# Patient Record
Sex: Male | Born: 1985 | Race: White | Hispanic: No | Smoking: Never smoker
Health system: Southern US, Community
[De-identification: ages and names within clinical notes are randomized; demographics above are authoritative.]

## PROBLEM LIST (undated history)

## (undated) ENCOUNTER — Emergency Department (HOSPITAL_COMMUNITY): Admission: EM | Payer: Medicare Other

## (undated) DIAGNOSIS — L309 Dermatitis, unspecified: Secondary | ICD-10-CM

## (undated) DIAGNOSIS — Z77011 Contact with and (suspected) exposure to lead: Secondary | ICD-10-CM

## (undated) DIAGNOSIS — K625 Hemorrhage of anus and rectum: Secondary | ICD-10-CM

## (undated) DIAGNOSIS — I10 Essential (primary) hypertension: Secondary | ICD-10-CM

## (undated) DIAGNOSIS — K5900 Constipation, unspecified: Secondary | ICD-10-CM

## (undated) DIAGNOSIS — R9431 Abnormal electrocardiogram [ECG] [EKG]: Secondary | ICD-10-CM

## (undated) DIAGNOSIS — M1990 Unspecified osteoarthritis, unspecified site: Secondary | ICD-10-CM

## (undated) DIAGNOSIS — R1032 Left lower quadrant pain: Secondary | ICD-10-CM

## (undated) DIAGNOSIS — R160 Hepatomegaly, not elsewhere classified: Secondary | ICD-10-CM

## (undated) DIAGNOSIS — K760 Fatty (change of) liver, not elsewhere classified: Secondary | ICD-10-CM

## (undated) DIAGNOSIS — F319 Bipolar disorder, unspecified: Secondary | ICD-10-CM

## (undated) DIAGNOSIS — F32A Depression, unspecified: Secondary | ICD-10-CM

## (undated) DIAGNOSIS — F410 Panic disorder [episodic paroxysmal anxiety] without agoraphobia: Secondary | ICD-10-CM

## (undated) DIAGNOSIS — N62 Hypertrophy of breast: Secondary | ICD-10-CM

## (undated) DIAGNOSIS — E785 Hyperlipidemia, unspecified: Secondary | ICD-10-CM

## (undated) DIAGNOSIS — K219 Gastro-esophageal reflux disease without esophagitis: Secondary | ICD-10-CM

## (undated) DIAGNOSIS — F329 Major depressive disorder, single episode, unspecified: Secondary | ICD-10-CM

## (undated) DIAGNOSIS — K529 Noninfective gastroenteritis and colitis, unspecified: Secondary | ICD-10-CM

## (undated) DIAGNOSIS — R079 Chest pain, unspecified: Secondary | ICD-10-CM

## (undated) DIAGNOSIS — R109 Unspecified abdominal pain: Secondary | ICD-10-CM

## (undated) DIAGNOSIS — E119 Type 2 diabetes mellitus without complications: Secondary | ICD-10-CM

## (undated) HISTORY — DX: Depression, unspecified: F32.A

## (undated) HISTORY — DX: Fatty (change of) liver, not elsewhere classified: K76.0

## (undated) HISTORY — DX: Hepatomegaly, not elsewhere classified: R16.0

## (undated) HISTORY — DX: Gastro-esophageal reflux disease without esophagitis: K21.9

## (undated) HISTORY — DX: Hyperlipidemia, unspecified: E78.5

## (undated) HISTORY — DX: Hypertrophy of breast: N62

## (undated) HISTORY — PX: OTHER SURGICAL HISTORY: SHX169

## (undated) HISTORY — DX: Major depressive disorder, single episode, unspecified: F32.9

## (undated) HISTORY — PX: DEBRIDEMENT AND CLOSURE WOUND: SHX5614

---
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
## 2008-04-06 ENCOUNTER — Emergency Department (HOSPITAL_COMMUNITY): Admission: EM | Admit: 2008-04-06 | Discharge: 2008-04-06 | Payer: Self-pay | Admitting: Emergency Medicine

## 2008-07-18 ENCOUNTER — Emergency Department (HOSPITAL_COMMUNITY): Admission: EM | Admit: 2008-07-18 | Discharge: 2008-07-18 | Payer: Self-pay | Admitting: Family Medicine

## 2011-12-10 DIAGNOSIS — T300 Burn of unspecified body region, unspecified degree: Secondary | ICD-10-CM | POA: Diagnosis not present

## 2011-12-10 DIAGNOSIS — L7211 Pilar cyst: Secondary | ICD-10-CM | POA: Diagnosis not present

## 2011-12-10 DIAGNOSIS — Z79899 Other long term (current) drug therapy: Secondary | ICD-10-CM | POA: Diagnosis not present

## 2011-12-12 DIAGNOSIS — D485 Neoplasm of uncertain behavior of skin: Secondary | ICD-10-CM | POA: Diagnosis not present

## 2011-12-12 DIAGNOSIS — L7211 Pilar cyst: Secondary | ICD-10-CM | POA: Diagnosis not present

## 2011-12-22 DIAGNOSIS — H53149 Visual discomfort, unspecified: Secondary | ICD-10-CM | POA: Diagnosis not present

## 2011-12-22 DIAGNOSIS — H3129 Other hereditary choroidal dystrophy: Secondary | ICD-10-CM | POA: Diagnosis not present

## 2011-12-22 DIAGNOSIS — H5210 Myopia, unspecified eye: Secondary | ICD-10-CM | POA: Diagnosis not present

## 2012-06-03 DIAGNOSIS — R112 Nausea with vomiting, unspecified: Secondary | ICD-10-CM | POA: Diagnosis not present

## 2012-06-03 DIAGNOSIS — R509 Fever, unspecified: Secondary | ICD-10-CM | POA: Diagnosis not present

## 2012-06-03 DIAGNOSIS — R1110 Vomiting, unspecified: Secondary | ICD-10-CM | POA: Diagnosis not present

## 2012-06-03 DIAGNOSIS — R109 Unspecified abdominal pain: Secondary | ICD-10-CM | POA: Diagnosis not present

## 2012-06-03 DIAGNOSIS — R197 Diarrhea, unspecified: Secondary | ICD-10-CM | POA: Diagnosis not present

## 2012-08-12 DIAGNOSIS — F3132 Bipolar disorder, current episode depressed, moderate: Secondary | ICD-10-CM | POA: Diagnosis not present

## 2012-08-16 DIAGNOSIS — Z880 Allergy status to penicillin: Secondary | ICD-10-CM | POA: Diagnosis not present

## 2012-08-16 DIAGNOSIS — J020 Streptococcal pharyngitis: Secondary | ICD-10-CM | POA: Diagnosis not present

## 2012-12-10 DIAGNOSIS — R109 Unspecified abdominal pain: Secondary | ICD-10-CM | POA: Diagnosis not present

## 2012-12-10 DIAGNOSIS — L02219 Cutaneous abscess of trunk, unspecified: Secondary | ICD-10-CM | POA: Diagnosis not present

## 2012-12-12 ENCOUNTER — Encounter (HOSPITAL_COMMUNITY): Payer: Self-pay | Admitting: *Deleted

## 2012-12-12 ENCOUNTER — Emergency Department (HOSPITAL_COMMUNITY)
Admission: EM | Admit: 2012-12-12 | Discharge: 2012-12-12 | Disposition: A | Discharge status: Home / Self Care | Payer: Medicare Other | Attending: Emergency Medicine | Admitting: Emergency Medicine

## 2012-12-12 DIAGNOSIS — L02219 Cutaneous abscess of trunk, unspecified: Secondary | ICD-10-CM | POA: Diagnosis not present

## 2012-12-12 DIAGNOSIS — L03319 Cellulitis of trunk, unspecified: Secondary | ICD-10-CM | POA: Diagnosis not present

## 2012-12-12 DIAGNOSIS — L0390 Cellulitis, unspecified: Secondary | ICD-10-CM

## 2012-12-12 DIAGNOSIS — Z880 Allergy status to penicillin: Secondary | ICD-10-CM | POA: Diagnosis not present

## 2012-12-12 DIAGNOSIS — F17200 Nicotine dependence, unspecified, uncomplicated: Secondary | ICD-10-CM | POA: Insufficient documentation

## 2012-12-12 LAB — CBC WITH DIFFERENTIAL/PLATELET
Basophils Absolute: 0 10*3/uL (ref 0.0–0.1)
Basophils Relative: 1 % (ref 0–1)
HCT: 42.1 % (ref 39.0–52.0)
Lymphocytes Relative: 38 % (ref 12–46)
MCHC: 34.9 g/dL (ref 30.0–36.0)
Monocytes Absolute: 0.8 10*3/uL (ref 0.1–1.0)
Neutro Abs: 3.1 10*3/uL (ref 1.7–7.7)
Neutrophils Relative %: 47 % (ref 43–77)
Platelets: 253 10*3/uL (ref 150–400)
RDW: 11.9 % (ref 11.5–15.5)
WBC: 6.6 10*3/uL (ref 4.0–10.5)

## 2012-12-12 LAB — BASIC METABOLIC PANEL
CO2: 29 mEq/L (ref 19–32)
Chloride: 97 mEq/L (ref 96–112)
GFR calc Af Amer: >90 mL/min (ref >90)
Potassium: 4.3 mEq/L (ref 3.5–5.1)
Sodium: 135 mEq/L (ref 135–145)

## 2012-12-12 MED ORDER — VANCOMYCIN HCL IN DEXTROSE 1-5 GM/200ML-% IV SOLN
1000.0000 mg | Freq: Once | INTRAVENOUS | Status: AC
  Administered 2012-12-12: 1000 mg via INTRAVENOUS
  Filled 2012-12-12: qty 200

## 2012-12-12 NOTE — ED Notes (Signed)
Pt c/o "knot" to left groin area that started Friday evening, was seen at [facility] yesterday, had ct scan and xray's performed, given iv antibiotics, pt states that the area has gotten bigger and more painful since yesterday.

## 2012-12-12 NOTE — ED Notes (Signed)
Eating crackers/peanut butter, NAD

## 2012-12-12 NOTE — ED Provider Notes (Signed)
History  This chart was scribed for Preston Lennert, MD by Bennett Scrape, ED Scribe. This patient was seen in room APA08/[facility]08 and the patient's care was started at 4:42 PM.  CSN: [id number]  Arrival date & time 12/12/12  1520   First MD Initiated Contact with Patient 12/12/12 1642      Chief Complaint  Patient presents with  . Groin Pain     Patient is a 27 y.o. male presenting with groin pain. The history is provided by the patient. No language interpreter was used.  Groin Pain This is a new problem. The current episode started 2 days ago. The problem occurs constantly. The problem has been gradually worsening. Pertinent negatives include no chest pain and no abdominal pain. The symptoms are aggravated by walking. Nothing relieves the symptoms. Treatments tried: IV antibiotics  The treatment provided mild relief.    HPI Comments: Preston Berry is a 27 y.o. male who presents to the Emergency Department complaining of 2 days of gradual onset, gradually worsening, constant left groin pain. Pt was seen at [facility] yesterday for the same and has a CT scan done that showed an infection that wasn't able to be lanced at the time. He received IV antibiotics and was discharged with Norco and Bactrim. He states that the area has gotten bigger since yesterday. He denies fevers, chills and nausea as associated symptoms. Pt does not have a h/o chronic medical conditions. Pt is a current everyday smoker but denies alcohol use.   No PCP  History reviewed. No pertinent past medical history.  Past Surgical History  Procedure Laterality Date  . Abdominal surgery      History reviewed. No pertinent family history.  History  Substance Use Topics  . Smoking status: Current Some Day Smoker  . Smokeless tobacco: Not on file  . Alcohol Use: No      Review of Systems  Constitutional: Negative for appetite change and fatigue.  HENT: Negative for congestion, sinus pressure and ear discharge.    Eyes: Negative for discharge.  Respiratory: Negative for cough.   Cardiovascular: Negative for chest pain.  Gastrointestinal: Negative for abdominal pain and diarrhea.  Genitourinary: Negative for frequency and hematuria.  Musculoskeletal: Negative for back pain.  Skin: Negative for rash.    Allergies  Penicillins  Home Medications   Current Outpatient Rx  Name  Route  Sig  Dispense  Refill  . HYDROcodone-acetaminophen (NORCO/VICODIN) 5-325 MG per tablet   Oral   Take 1 tablet by mouth every 6 (six) hours as needed for pain.         Marland Kitchen sulfamethoxazole-trimethoprim (BACTRIM DS) 800-160 MG per tablet   Oral   Take 1 tablet by mouth 2 (two) times daily. Started (12/11/2012)           Triage Vitals :BP 134/78  Pulse 74  Temp(Src) 98.3 F (36.8 C) (Oral)  Resp 24  Ht 5\' 7"  (1.702 m)  Wt 195 lb (88.451 kg)  BMI 30.53 kg/m2  SpO2 100%  Physical Exam  Nursing note and vitals reviewed. Constitutional: He is oriented to person, place, and time. He appears well-developed and well-nourished.  HENT:  Head: Normocephalic and atraumatic.  Eyes: Conjunctivae are normal.  Neck: No tracheal deviation present.  Cardiovascular: Normal rate.   Pulmonary/Chest: Effort normal.  Genitourinary:  Swelling and tenderness to left inguinal area, possible cellulitic rash, no obvious fluctuant area to drain  Musculoskeletal: Normal range of motion.  Neurological: He is alert and oriented  to person, place, and time.  Skin: Skin is warm and dry.  Psychiatric: He has a normal mood and affect. His behavior is normal.    ED Course  Procedures (including critical care time)  Medications  vancomycin (VANCOCIN) IVPB 1000 mg/200 mL premix (0 mg Intravenous Stopped 12/12/12 1851)    DIAGNOSTIC STUDIES: Oxygen Saturation is 100% on room air, normal by my interpretation.    COORDINATION OF CARE: 4:47 PM-Discussed treatment plan which includes IV antibiotics, CBC and BMP with pt and pt agreed  to plan. Will provie a surgery referral to f/u as needed.   7:34 PM-Informed pt of lab work results. Discussed discharge plan which includes continued use of antibiotics with pt and pt agreed to plan. Also advised pt to follow up with surgery referral as needed and pt agreed. Addressed symptoms to return for with pt.   Labs Reviewed  CBC WITH DIFFERENTIAL  BASIC METABOLIC PANEL   No results found.   No diagnosis found.    MDM  Cellulitis no obvious abscess now.  Neg ct for abscess.  To follow up   The chart was scribed for me under my direct supervision.  I personally performed the history, physical, and medical decision making and all procedures in the evaluation of this patient.Preston Lennert, MD 12/12/12 [phone]

## 2012-12-19 DIAGNOSIS — J40 Bronchitis, not specified as acute or chronic: Secondary | ICD-10-CM | POA: Diagnosis not present

## 2012-12-19 DIAGNOSIS — R509 Fever, unspecified: Secondary | ICD-10-CM | POA: Diagnosis not present

## 2012-12-19 DIAGNOSIS — J209 Acute bronchitis, unspecified: Secondary | ICD-10-CM | POA: Diagnosis not present

## 2012-12-19 DIAGNOSIS — J029 Acute pharyngitis, unspecified: Secondary | ICD-10-CM | POA: Diagnosis not present

## 2013-01-25 ENCOUNTER — Emergency Department (HOSPITAL_COMMUNITY)
Admission: EM | Admit: 2013-01-25 | Discharge: 2013-01-25 | Disposition: A | Discharge status: Home / Self Care | Payer: Medicare Other | Attending: Emergency Medicine | Admitting: Emergency Medicine

## 2013-01-25 ENCOUNTER — Encounter (HOSPITAL_COMMUNITY): Payer: Self-pay

## 2013-01-25 DIAGNOSIS — L929 Granulomatous disorder of the skin and subcutaneous tissue, unspecified: Secondary | ICD-10-CM

## 2013-01-25 DIAGNOSIS — F17200 Nicotine dependence, unspecified, uncomplicated: Secondary | ICD-10-CM | POA: Insufficient documentation

## 2013-01-25 DIAGNOSIS — L980 Pyogenic granuloma: Secondary | ICD-10-CM | POA: Insufficient documentation

## 2013-01-25 MED ORDER — IBUPROFEN 800 MG PO TABS: 800.0000 mg | ORAL_TABLET | Freq: Three times a day (TID) | ORAL | Status: DC

## 2013-01-25 MED ORDER — DOXYCYCLINE HYCLATE 100 MG PO CAPS: 100.0000 mg | ORAL_CAPSULE | Freq: Two times a day (BID) | ORAL | Status: DC

## 2013-01-25 MED ORDER — LIDOCAINE HCL (PF) 1 % IJ SOLN
INTRAMUSCULAR | Status: AC
  Administered 2013-01-25: 16:00:00
  Filled 2013-01-25: qty 5

## 2013-01-25 NOTE — ED Notes (Signed)
Pt reports abscess left inner thigh for about a month.  Pt reports taking antibiotics w/out relief.  Pt reports the area "draining yellow pus".
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
## 2013-02-06 NOTE — ED Provider Notes (Signed)
Medical screening examination/treatment/procedure(s) were performed by non-physician practitioner and as supervising physician I was immediately available for consultation/collaboration.   Shelda Jakes, MD 02/06/13 [phone]

## 2013-03-18 DIAGNOSIS — IMO0002 Reserved for concepts with insufficient information to code with codable children: Secondary | ICD-10-CM | POA: Diagnosis not present

## 2013-03-18 DIAGNOSIS — X500XXA Overexertion from strenuous movement or load, initial encounter: Secondary | ICD-10-CM | POA: Diagnosis not present

## 2013-03-18 DIAGNOSIS — Z880 Allergy status to penicillin: Secondary | ICD-10-CM | POA: Diagnosis not present

## 2013-03-18 DIAGNOSIS — Z809 Family history of malignant neoplasm, unspecified: Secondary | ICD-10-CM | POA: Diagnosis not present

## 2013-03-18 DIAGNOSIS — M25569 Pain in unspecified knee: Secondary | ICD-10-CM | POA: Diagnosis not present

## 2013-05-20 DIAGNOSIS — M545 Low back pain, unspecified: Secondary | ICD-10-CM | POA: Diagnosis not present

## 2013-05-20 DIAGNOSIS — Z809 Family history of malignant neoplasm, unspecified: Secondary | ICD-10-CM | POA: Diagnosis not present

## 2013-05-20 DIAGNOSIS — IMO0002 Reserved for concepts with insufficient information to code with codable children: Secondary | ICD-10-CM | POA: Diagnosis not present

## 2013-06-25 DIAGNOSIS — Z711 Person with feared health complaint in whom no diagnosis is made: Secondary | ICD-10-CM | POA: Diagnosis not present

## 2013-08-19 DIAGNOSIS — R112 Nausea with vomiting, unspecified: Secondary | ICD-10-CM | POA: Diagnosis not present

## 2013-08-19 DIAGNOSIS — N341 Nonspecific urethritis: Secondary | ICD-10-CM | POA: Diagnosis not present

## 2013-08-19 DIAGNOSIS — R1013 Epigastric pain: Secondary | ICD-10-CM | POA: Diagnosis not present

## 2013-08-19 DIAGNOSIS — F319 Bipolar disorder, unspecified: Secondary | ICD-10-CM | POA: Diagnosis not present

## 2013-08-19 DIAGNOSIS — R059 Cough, unspecified: Secondary | ICD-10-CM | POA: Diagnosis not present

## 2013-08-19 DIAGNOSIS — B338 Other specified viral diseases: Secondary | ICD-10-CM | POA: Diagnosis not present

## 2013-08-19 DIAGNOSIS — R1110 Vomiting, unspecified: Secondary | ICD-10-CM | POA: Diagnosis not present

## 2013-08-19 DIAGNOSIS — R509 Fever, unspecified: Secondary | ICD-10-CM | POA: Diagnosis not present

## 2013-08-19 DIAGNOSIS — R197 Diarrhea, unspecified: Secondary | ICD-10-CM | POA: Diagnosis not present

## 2013-08-20 ENCOUNTER — Emergency Department (HOSPITAL_COMMUNITY)
Admission: EM | Admit: 2013-08-20 | Discharge: 2013-08-21 | Disposition: A | Discharge status: Home / Self Care | Payer: Medicare Other | Attending: Emergency Medicine | Admitting: Emergency Medicine

## 2013-08-20 ENCOUNTER — Encounter (HOSPITAL_COMMUNITY): Payer: Self-pay | Admitting: Emergency Medicine

## 2013-08-20 DIAGNOSIS — R52 Pain, unspecified: Secondary | ICD-10-CM | POA: Diagnosis not present

## 2013-08-20 DIAGNOSIS — R112 Nausea with vomiting, unspecified: Secondary | ICD-10-CM | POA: Insufficient documentation

## 2013-08-20 DIAGNOSIS — M129 Arthropathy, unspecified: Secondary | ICD-10-CM | POA: Diagnosis not present

## 2013-08-20 DIAGNOSIS — F17200 Nicotine dependence, unspecified, uncomplicated: Secondary | ICD-10-CM | POA: Diagnosis not present

## 2013-08-20 DIAGNOSIS — B9789 Other viral agents as the cause of diseases classified elsewhere: Secondary | ICD-10-CM | POA: Insufficient documentation

## 2013-08-20 DIAGNOSIS — R197 Diarrhea, unspecified: Secondary | ICD-10-CM | POA: Insufficient documentation

## 2013-08-20 DIAGNOSIS — R1084 Generalized abdominal pain: Secondary | ICD-10-CM | POA: Diagnosis not present

## 2013-08-20 DIAGNOSIS — B349 Viral infection, unspecified: Secondary | ICD-10-CM

## 2013-08-20 DIAGNOSIS — B338 Other specified viral diseases: Secondary | ICD-10-CM | POA: Diagnosis not present

## 2013-08-20 DIAGNOSIS — Z880 Allergy status to penicillin: Secondary | ICD-10-CM | POA: Insufficient documentation

## 2013-08-20 HISTORY — DX: Unspecified osteoarthritis, unspecified site: M19.90

## 2013-08-20 MED ORDER — ONDANSETRON HCL 4 MG/2ML IJ SOLN
4.0000 mg | Freq: Once | INTRAMUSCULAR | Status: AC
  Administered 2013-08-20: 4 mg via INTRAVENOUS
  Filled 2013-08-20: qty 2

## 2013-08-20 MED ORDER — KETOROLAC TROMETHAMINE 30 MG/ML IJ SOLN
30.0000 mg | Freq: Once | INTRAMUSCULAR | Status: AC
  Administered 2013-08-20: 30 mg via INTRAVENOUS
  Filled 2013-08-20: qty 1

## 2013-08-20 MED ORDER — SODIUM CHLORIDE 0.9 % IV SOLN
Freq: Once | INTRAVENOUS | Status: AC
  Administered 2013-08-20: via INTRAVENOUS

## 2013-08-20 MED ORDER — ACETAMINOPHEN 325 MG PO TABS
650.0000 mg | ORAL_TABLET | Freq: Once | ORAL | Status: AC
  Administered 2013-08-20: 650 mg via ORAL
  Filled 2013-08-20: qty 2

## 2013-08-20 NOTE — ED Notes (Signed)
Pt. Reports flu like symptoms starting last week. Pt. Reports being diagnosed with a Staph infection at [facility]. Pt. Reports increased nausea/vomiting/diarrhea with fever/chills.
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
## 2013-08-21 LAB — CBC WITH DIFFERENTIAL/PLATELET
BASOS PCT: 0 % (ref 0–1)
Basophils Absolute: 0 10*3/uL (ref 0.0–0.1)
EOS ABS: 0.1 10*3/uL (ref 0.0–0.7)
Eosinophils Relative: 1 % (ref 0–5)
HCT: 45.3 % (ref 39.0–52.0)
Hemoglobin: 15.8 g/dL (ref 13.0–17.0)
Lymphocytes Relative: 14 % (ref 12–46)
Lymphs Abs: 1 10*3/uL (ref 0.7–4.0)
MCH: 30.9 pg (ref 26.0–34.0)
MCHC: 34.9 g/dL (ref 30.0–36.0)
MCV: 88.6 fL (ref 78.0–100.0)
MONOS PCT: 12 % (ref 3–12)
Monocytes Absolute: 0.8 10*3/uL (ref 0.1–1.0)
NEUTROS PCT: 73 % (ref 43–77)
Neutro Abs: 5 10*3/uL (ref 1.7–7.7)
PLATELETS: 242 10*3/uL (ref 150–400)
RBC: 5.11 MIL/uL (ref 4.22–5.81)
RDW: 12.4 % (ref 11.5–15.5)
WBC: 6.9 10*3/uL (ref 4.0–10.5)

## 2013-08-21 LAB — BASIC METABOLIC PANEL
BUN: 10 mg/dL (ref 6–23)
CO2: 28 mEq/L (ref 19–32)
Calcium: 9.5 mg/dL (ref 8.4–10.5)
Chloride: 94 mEq/L — ABNORMAL LOW (ref 96–112)
Creatinine, Ser: 0.92 mg/dL (ref 0.50–1.35)
Glucose, Bld: 97 mg/dL (ref 70–99)
POTASSIUM: 3.9 meq/L (ref 3.7–5.3)
SODIUM: 134 meq/L — AB (ref 137–147)

## 2013-08-21 LAB — URINALYSIS, ROUTINE W REFLEX MICROSCOPIC
BILIRUBIN URINE: NEGATIVE
Glucose, UA: NEGATIVE mg/dL
Hgb urine dipstick: NEGATIVE
Ketones, ur: NEGATIVE mg/dL
Leukocytes, UA: NEGATIVE
NITRITE: NEGATIVE
PROTEIN: NEGATIVE mg/dL
SPECIFIC GRAVITY, URINE: 1.015 (ref 1.005–1.030)
UROBILINOGEN UA: 0.2 mg/dL (ref 0.0–1.0)
pH: 7.5 (ref 5.0–8.0)

## 2013-08-21 MED ORDER — ONDANSETRON HCL 4 MG PO TABS: 4.0000 mg | ORAL_TABLET | Freq: Four times a day (QID) | ORAL | Status: DC

## 2013-08-21 MED ORDER — BENZONATATE 200 MG PO CAPS: 200.0000 mg | ORAL_CAPSULE | Freq: Three times a day (TID) | ORAL | Status: DC | PRN

## 2013-08-21 MED ORDER — IBUPROFEN 800 MG PO TABS: 800.0000 mg | ORAL_TABLET | Freq: Three times a day (TID) | ORAL | Status: DC

## 2013-08-21 NOTE — Discharge Instructions (Signed)
Diet for Diarrhea, Adult Frequent, runny stools (diarrhea) may be caused or worsened by food or drink. Diarrhea may be relieved by changing your diet. Since diarrhea can last up to 7 days, it is easy for you to lose too much fluid from the body and become dehydrated. Fluids that are lost need to be replaced. Along with a modified diet, make sure you drink enough fluids to keep your urine clear or pale yellow. DIET INSTRUCTIONS  Ensure adequate fluid intake (hydration): have 1 cup (8 oz) of fluid for each diarrhea episode. Avoid fluids that contain simple sugars or sports drinks, fruit juices, whole milk products, and sodas. Your urine should be clear or pale yellow if you are drinking enough fluids. Hydrate with an oral rehydration solution that you can purchase at pharmacies, retail stores, and online. You can prepare an oral rehydration solution at home by mixing the following ingredients together:    tsp table salt.   tsp baking soda.   tsp salt substitute containing potassium chloride.  1  tablespoons sugar.  1 L (34 oz) of water.  Certain foods and beverages may increase the speed at which food moves through the gastrointestinal (GI) tract. These foods and beverages should be avoided and include:  Caffeinated and alcoholic beverages.  High-fiber foods, such as raw fruits and vegetables, nuts, seeds, and whole grain breads and cereals.  Foods and beverages sweetened with sugar alcohols, such as xylitol, sorbitol, and mannitol.  Some foods may be well tolerated and may help thicken stool including:  Starchy foods, such as rice, toast, pasta, low-sugar cereal, oatmeal, grits, baked potatoes, crackers, and bagels.   Bananas.   Applesauce.  Add probiotic-rich foods to help increase healthy bacteria in the GI tract, such as yogurt and fermented milk products. RECOMMENDED FOODS AND BEVERAGES Starches Choose foods with less than 2 g of fiber per serving.  Recommended:  White,  [place], and pita breads, plain rolls, buns, bagels. Plain muffins, matzo. Soda, saltine, or graham crackers. Pretzels, melba toast, zwieback. Cooked cereals made with water: cornmeal, farina, cream cereals. Dry cereals: refined corn, wheat, rice. Potatoes prepared any way without skins, refined macaroni, spaghetti, noodles, refined rice.  Avoid:  Bread, rolls, or crackers made with whole wheat, multi-grains, rye, bran seeds, nuts, or coconut. Corn tortillas or taco shells. Cereals containing whole grains, multi-grains, bran, coconut, nuts, raisins. Cooked or dry oatmeal. Coarse wheat cereals, granola. Cereals advertised as "high-fiber." Potato skins. Whole grain pasta, wild or brown rice. Popcorn. Sweet potatoes, yams. Sweet rolls, doughnuts, waffles, pancakes, sweet breads. Vegetables  Recommended: Strained tomato and vegetable juices. Most well-cooked and canned vegetables without seeds. Fresh: Tender lettuce, cucumber without the skin, cabbage, spinach, bean sprouts.  Avoid: Fresh, cooked, or canned: Artichokes, baked beans, beet greens, broccoli, Brussels sprouts, corn, kale, legumes, peas, sweet potatoes. Cooked: Green or red cabbage, spinach. Avoid large servings of any vegetables because vegetables shrink when cooked, and they contain more fiber per serving than fresh vegetables. Fruit  Recommended: Cooked or canned: Apricots, applesauce, cantaloupe, cherries, fruit cocktail, grapefruit, grapes, kiwi, mandarin oranges, peaches, pears, plums, watermelon. Fresh: Apples without skin, ripe banana, grapes, cantaloupe, cherries, grapefruit, peaches, oranges, plums. Keep servings limited to  cup or 1 piece.  Avoid: Fresh: Apples with skin, apricots, mangoes, pears, raspberries, strawberries. Prune juice, stewed or dried prunes. Dried fruits, raisins, dates. Large servings of all fresh fruits. Protein  Recommended: Ground or well-cooked tender beef, ham, veal, lamb, pork, or poultry. Eggs. Fish,  oysters, shrimp,  lobster, other seafoods. Liver, organ meats.  Avoid: Tough, fibrous meats with gristle. Peanut butter, smooth or chunky. Cheese, nuts, seeds, legumes, dried peas, beans, lentils. Dairy  Recommended: Yogurt, lactose-free milk, kefir, drinkable yogurt, buttermilk, soy milk, or plain hard cheese.  Avoid: Milk, chocolate milk, beverages made with milk, such as milkshakes. Soups  Recommended: Bouillon, broth, or soups made from allowed foods. Any strained soup.  Avoid: Soups made from vegetables that are not allowed, cream or milk-based soups. Desserts and Sweets  Recommended: Sugar-free gelatin, sugar-free frozen ice pops made without sugar alcohol.  Avoid: Plain cakes and cookies, pie made with fruit, pudding, custard, cream pie. Gelatin, fruit, ice, sherbet, frozen ice pops. Ice cream, ice milk without nuts. Plain hard candy, honey, jelly, molasses, syrup, sugar, chocolate syrup, gumdrops, marshmallows. Fats and Oils  Recommended: Limit fats to less than 8 tsp per day.  Avoid: Seeds, nuts, olives, avocados. Margarine, butter, cream, mayonnaise, salad oils, plain salad dressings. Plain gravy, crisp bacon without rind. Beverages  Recommended: Water, decaffeinated teas, oral rehydration solutions, sugar-free beverages not sweetened with sugar alcohols.  Avoid: Fruit juices, caffeinated beverages (coffee, tea, soda), alcohol, sports drinks, or lemon-lime soda. Condiments  Recommended: Ketchup, mustard, horseradish, vinegar, cocoa powder. Spices in moderation: allspice, basil, bay leaves, celery powder or leaves, cinnamon, cumin powder, curry powder, ginger, mace, marjoram, onion or garlic powder, oregano, paprika, parsley flakes, ground pepper, rosemary, sage, savory, tarragon, thyme, turmeric.  Avoid: Coconut, honey. Document Released: 09/13/2003 Document Revised: 03/17/2012 Document Reviewed: 11/07/2011 [facility] Patient Information 2014 [place].  Nausea and  Vomiting Nausea means you feel sick to your stomach. Throwing up (vomiting) is a reflex where stomach contents come out of your mouth. HOME CARE   Take medicine as told by your doctor.  Do not force yourself to eat. However, you do need to drink fluids.  If you feel like eating, eat a normal diet as told by your doctor.  Eat rice, wheat, potatoes, bread, lean meats, yogurt, fruits, and vegetables.  Avoid high-fat foods.  Drink enough fluids to keep your pee (urine) clear or pale yellow.  Ask your doctor how to replace body fluid losses (rehydrate). Signs of body fluid loss (dehydration) include:  Feeling very thirsty.  Dry lips and mouth.  Feeling dizzy.  Dark pee.  Peeing less than normal.  Feeling confused.  Fast breathing or heart rate. GET HELP RIGHT AWAY IF:   You have blood in your throw up.  You have black or bloody poop (stool).  You have a bad headache or stiff neck.  You feel confused.  You have bad belly (abdominal) pain.  You have chest pain or trouble breathing.  You do not pee at least once every 8 hours.  You have cold, clammy skin.  You keep throwing up after 24 to 48 hours.  You have a fever. MAKE SURE YOU:   Understand these instructions.  Will watch your condition.  Will get help right away if you are not doing well or get worse. Document Released: 12/10/2007 Document Revised: 09/15/2011 Document Reviewed: 11/22/2010 [facility] Patient Information 2014 [place], [place].

## 2013-08-21 NOTE — ED Provider Notes (Signed)
Medical screening examination/treatment/procedure(s) were performed by non-physician practitioner and as supervising physician I was immediately available for consultation/collaboration.    Johnna Acosta, MD 08/21/13 [phone]

## 2013-10-15 DIAGNOSIS — R52 Pain, unspecified: Secondary | ICD-10-CM | POA: Diagnosis not present

## 2013-10-15 DIAGNOSIS — M545 Low back pain, unspecified: Secondary | ICD-10-CM | POA: Diagnosis not present

## 2013-10-15 DIAGNOSIS — IMO0002 Reserved for concepts with insufficient information to code with codable children: Secondary | ICD-10-CM | POA: Diagnosis not present

## 2013-10-15 DIAGNOSIS — L259 Unspecified contact dermatitis, unspecified cause: Secondary | ICD-10-CM | POA: Diagnosis not present

## 2013-10-15 DIAGNOSIS — G8929 Other chronic pain: Secondary | ICD-10-CM | POA: Diagnosis not present

## 2013-10-15 DIAGNOSIS — Z23 Encounter for immunization: Secondary | ICD-10-CM | POA: Diagnosis not present

## 2013-10-15 DIAGNOSIS — S61209A Unspecified open wound of unspecified finger without damage to nail, initial encounter: Secondary | ICD-10-CM | POA: Diagnosis not present

## 2014-01-05 DIAGNOSIS — F909 Attention-deficit hyperactivity disorder, unspecified type: Secondary | ICD-10-CM | POA: Diagnosis not present

## 2014-01-05 DIAGNOSIS — G8929 Other chronic pain: Secondary | ICD-10-CM | POA: Diagnosis not present

## 2014-01-05 DIAGNOSIS — M549 Dorsalgia, unspecified: Secondary | ICD-10-CM | POA: Diagnosis not present

## 2014-01-05 DIAGNOSIS — Z880 Allergy status to penicillin: Secondary | ICD-10-CM | POA: Diagnosis not present

## 2014-01-05 DIAGNOSIS — K219 Gastro-esophageal reflux disease without esophagitis: Secondary | ICD-10-CM | POA: Diagnosis not present

## 2014-01-05 DIAGNOSIS — F17200 Nicotine dependence, unspecified, uncomplicated: Secondary | ICD-10-CM | POA: Diagnosis not present

## 2014-01-05 DIAGNOSIS — F319 Bipolar disorder, unspecified: Secondary | ICD-10-CM | POA: Diagnosis not present

## 2014-01-05 DIAGNOSIS — R1110 Vomiting, unspecified: Secondary | ICD-10-CM | POA: Diagnosis not present

## 2014-01-05 DIAGNOSIS — D72829 Elevated white blood cell count, unspecified: Secondary | ICD-10-CM | POA: Diagnosis not present

## 2014-01-05 DIAGNOSIS — R42 Dizziness and giddiness: Secondary | ICD-10-CM | POA: Diagnosis not present

## 2014-01-05 DIAGNOSIS — K921 Melena: Secondary | ICD-10-CM | POA: Diagnosis not present

## 2014-01-05 DIAGNOSIS — K920 Hematemesis: Secondary | ICD-10-CM | POA: Diagnosis not present

## 2014-01-05 DIAGNOSIS — Z883 Allergy status to other anti-infective agents status: Secondary | ICD-10-CM | POA: Diagnosis not present

## 2014-01-05 DIAGNOSIS — R7309 Other abnormal glucose: Secondary | ICD-10-CM | POA: Diagnosis not present

## 2014-01-06 DIAGNOSIS — R1115 Cyclical vomiting syndrome unrelated to migraine: Secondary | ICD-10-CM | POA: Diagnosis not present

## 2014-03-27 ENCOUNTER — Telehealth: Payer: Self-pay | Admitting: Family Medicine

## 2014-03-27 NOTE — Telephone Encounter (Signed)
Appt given

## 2014-03-29 ENCOUNTER — Ambulatory Visit (INDEPENDENT_AMBULATORY_CARE_PROVIDER_SITE_OTHER): Payer: Medicare Other | Admitting: Family

## 2014-03-29 ENCOUNTER — Encounter: Payer: Self-pay | Admitting: Family

## 2014-03-29 ENCOUNTER — Encounter (INDEPENDENT_AMBULATORY_CARE_PROVIDER_SITE_OTHER): Payer: Self-pay

## 2014-03-29 VITALS — BP 134/87 | HR 81 | Temp 98.0°F | Ht 68.0 in | Wt 208.2 lb

## 2014-03-29 DIAGNOSIS — K2100 Gastro-esophageal reflux disease with esophagitis, without bleeding: Secondary | ICD-10-CM

## 2014-03-29 DIAGNOSIS — Z0000 Encounter for general adult medical examination without abnormal findings: Secondary | ICD-10-CM

## 2014-03-29 DIAGNOSIS — E559 Vitamin D deficiency, unspecified: Secondary | ICD-10-CM | POA: Diagnosis not present

## 2014-03-29 DIAGNOSIS — K219 Gastro-esophageal reflux disease without esophagitis: Secondary | ICD-10-CM | POA: Insufficient documentation

## 2014-03-29 DIAGNOSIS — E785 Hyperlipidemia, unspecified: Secondary | ICD-10-CM | POA: Diagnosis not present

## 2014-03-29 MED ORDER — OMEPRAZOLE 20 MG PO CPDR: 20.0000 mg | DELAYED_RELEASE_CAPSULE | Freq: Every day | ORAL | Status: DC

## 2014-03-29 NOTE — Patient Instructions (Signed)
Gastroesophageal Reflux Disease, Adult Gastroesophageal reflux disease (GERD) happens when acid from your stomach flows up into the esophagus. When acid comes in contact with the esophagus, the acid causes soreness (inflammation) in the esophagus. Over time, GERD may create small holes (ulcers) in the lining of the esophagus. CAUSES   Increased body weight. This puts pressure on the stomach, making acid rise from the stomach into the esophagus.  Smoking. This increases acid production in the stomach.  Drinking alcohol. This causes decreased pressure in the lower esophageal sphincter (valve or ring of muscle between the esophagus and stomach), allowing acid from the stomach into the esophagus.  Late evening meals and a full stomach. This increases pressure and acid production in the stomach.  A malformed lower esophageal sphincter. Sometimes, no cause is found. SYMPTOMS   Burning pain in the lower part of the mid-chest behind the breastbone and in the mid-stomach area. This may occur twice a week or more often.  Trouble swallowing.  Sore throat.  Dry cough.  Asthma-like symptoms including chest tightness, shortness of breath, or wheezing. DIAGNOSIS  Your caregiver may be able to diagnose GERD based on your symptoms. In some cases, X-rays and other tests may be done to check for complications or to check the condition of your stomach and esophagus. TREATMENT  Your caregiver may recommend over-the-counter or prescription medicines to help decrease acid production. Ask your caregiver before starting or adding any new medicines.  HOME CARE INSTRUCTIONS   Change the factors that you can control. Ask your caregiver for guidance concerning weight loss, quitting smoking, and alcohol consumption.  Avoid foods and drinks that make your symptoms worse, such as:  Caffeine or alcoholic drinks.  Chocolate.  Peppermint or mint flavorings.  Garlic and onions.  Spicy foods.  Citrus fruits,  such as oranges, lemons, or limes.  Tomato-based foods such as sauce, chili, salsa, and pizza.  Fried and fatty foods.  Avoid lying down for the 3 hours prior to your bedtime or prior to taking a nap.  Eat small, frequent meals instead of large meals.  Wear loose-fitting clothing. Do not wear anything tight around your waist that causes pressure on your stomach.  Raise the head of your bed 6 to 8 inches with wood blocks to help you sleep. Extra pillows will not help.  Only take over-the-counter or prescription medicines for pain, discomfort, or fever as directed by your caregiver.  Do not take aspirin, ibuprofen, or other nonsteroidal anti-inflammatory drugs (NSAIDs). SEEK IMMEDIATE MEDICAL CARE IF:   You have pain in your arms, neck, jaw, teeth, or back.  Your pain increases or changes in intensity or duration.  You develop nausea, vomiting, or sweating (diaphoresis).  You develop shortness of breath, or you faint.  Your vomit is green, yellow, black, or looks like coffee grounds or blood.  Your stool is red, bloody, or black. These symptoms could be signs of other problems, such as heart disease, gastric bleeding, or esophageal bleeding. MAKE SURE YOU:   Understand these instructions.  Will watch your condition.  Will get help right away if you are not doing well or get worse. Document Released: 04/02/2005 Document Revised: 09/15/2011 Document Reviewed: 01/10/2011 [facility] Patient Information 2015 [place], [place]. This information is not intended to replace advice given to you by your health care provider. Make sure you discuss any questions you have with your health care provider. Health Maintenance A healthy lifestyle and preventative care can promote health and wellness.  Maintain regular  health, dental, and eye exams.  Eat a healthy diet. Foods like vegetables, fruits, whole grains, low-fat dairy products, and lean protein foods contain the nutrients you need and  are low in calories. Decrease your intake of foods high in solid fats, added sugars, and salt. Get information about a proper diet from your health care provider, if necessary.  Regular physical exercise is one of the most important things you can do for your health. Most adults should get at least 150 minutes of moderate-intensity exercise (any activity that increases your heart rate and causes you to sweat) each week. In addition, most adults need muscle-strengthening exercises on 2 or more days a week.   Maintain a healthy weight. The body mass index (BMI) is a screening tool to identify possible weight problems. It provides an estimate of body fat based on height and weight. Your health care provider can find your BMI and can help you achieve or maintain a healthy weight. For males 20 years and older:  A BMI below 18.5 is considered underweight.  A BMI of 18.5 to 24.9 is normal.  A BMI of 25 to 29.9 is considered overweight.  A BMI of 30 and above is considered obese.  Maintain normal blood lipids and cholesterol by exercising and minimizing your intake of saturated fat. Eat a balanced diet with plenty of fruits and vegetables. Blood tests for lipids and cholesterol should begin at age 1 and be repeated every 5 years. If your lipid or cholesterol levels are high, you are over age 48, or you are at high risk for heart disease, you may need your cholesterol levels checked more frequently.Ongoing high lipid and cholesterol levels should be treated with medicines if diet and exercise are not working.  If you smoke, find out from your health care provider how to quit. If you do not use tobacco, do not start.  Lung cancer screening is recommended for adults aged 34-80 years who are at high risk for developing lung cancer because of a history of smoking. A yearly low-dose CT scan of the lungs is recommended for people who have at least a 30-pack-year history of smoking and are current smokers or  have quit within the past 15 years. A pack year of smoking is smoking an average of 1 pack of cigarettes a day for 1 year (for example, a 30-pack-year history of smoking could mean smoking 1 pack a day for 30 years or 2 packs a day for 15 years). Yearly screening should continue until the smoker has stopped smoking for at least 15 years. Yearly screening should be stopped for people who develop a health problem that would prevent them from having lung cancer treatment.  If you choose to drink alcohol, do not have more than 2 drinks per day. One drink is considered to be 12 oz (360 mL) of beer, 5 oz (150 mL) of wine, or 1.5 oz (45 mL) of liquor.  Avoid the use of street drugs. Do not share needles with anyone. Ask for help if you need support or instructions about stopping the use of drugs.  High blood pressure causes heart disease and increases the risk of stroke. Blood pressure should be checked at least every 1-2 years. Ongoing high blood pressure should be treated with medicines if weight loss and exercise are not effective.  If you are 72-27 years old, ask your health care provider if you should take aspirin to prevent heart disease.  Diabetes screening involves taking a blood  sample to check your fasting blood sugar level. This should be done once every 3 years after age 67 if you are at a normal weight and without risk factors for diabetes. Testing should be considered at a younger age or be carried out more frequently if you are overweight and have at least 1 risk factor for diabetes.  Colorectal cancer can be detected and often prevented. Most routine colorectal cancer screening begins at the age of 32 and continues through age 65. However, your health care provider may recommend screening at an earlier age if you have risk factors for colon cancer. On a yearly basis, your health care provider may provide home test kits to check for hidden blood in the stool. A small camera at the end of a tube  may be used to directly examine the colon (sigmoidoscopy or colonoscopy) to detect the earliest forms of colorectal cancer. Talk to your health care provider about this at age 23 when routine screening begins. A direct exam of the colon should be repeated every 5-10 years through age 22, unless early forms of precancerous polyps or small growths are found.  People who are at an increased risk for hepatitis B should be screened for this virus. You are considered at high risk for hepatitis B if:  You were born in a country where hepatitis B occurs often. Talk with your health care provider about which countries are considered high risk.  Your parents were born in a high-risk country and you have not received a shot to protect against hepatitis B (hepatitis B vaccine).  You have HIV or AIDS.  You use needles to inject street drugs.  You live with, or have sex with, someone who has hepatitis B.  You are a man who has sex with other men (MSM).  You get hemodialysis treatment.  You take certain medicines for conditions like cancer, organ transplantation, and autoimmune conditions.  Hepatitis C blood testing is recommended for all people born from [birth year] through 1965 and any individual with known risk factors for hepatitis C.  Healthy men should no longer receive prostate-specific antigen (PSA) blood tests as part of routine cancer screening. Talk to your health care provider about prostate cancer screening.  Testicular cancer screening is not recommended for adolescents or adult males who have no symptoms. Screening includes self-exam, a health care provider exam, and other screening tests. Consult with your health care provider about any symptoms you have or any concerns you have about testicular cancer.  Practice safe sex. Use condoms and avoid high-risk sexual practices to reduce the spread of sexually transmitted infections (STIs).  You should be screened for STIs, including gonorrhea and  chlamydia if:  You are sexually active and are younger than 24 years.  You are older than 24 years, and your health care provider tells you that you are at risk for this type of infection.  Your sexual activity has changed since you were last screened, and you are at an increased risk for chlamydia or gonorrhea. Ask your health care provider if you are at risk.  If you are at risk of being infected with HIV, it is recommended that you take a prescription medicine daily to prevent HIV infection. This is called pre-exposure prophylaxis (PrEP). You are considered at risk if:  You are a man who has sex with other men (MSM).  You are a heterosexual man who is sexually active with multiple partners.  You take drugs by injection.  You are  sexually active with a partner who has HIV.  Talk with your health care provider about whether you are at high risk of being infected with HIV. If you choose to begin PrEP, you should first be tested for HIV. You should then be tested every 3 months for as long as you are taking PrEP.  Use sunscreen. Apply sunscreen liberally and repeatedly throughout the day. You should seek shade when your shadow is shorter than you. Protect yourself by wearing long sleeves, pants, a wide-brimmed hat, and sunglasses year round whenever you are outdoors.  Tell your health care provider of new moles or changes in moles, especially if there is a change in shape or color. Also, tell your health care provider if a mole is larger than the size of a pencil eraser.  A one-time screening for abdominal aortic aneurysm (AAA) and surgical repair of large AAAs by ultrasound is recommended for men aged 11-75 years who are current or former smokers.  Stay current with your vaccines (immunizations). Document Released: 12/20/2007 Document Revised: 06/28/2013 Document Reviewed: 11/18/2010 [facility] Patient Information 2015 [place], [place]. This information is not intended to replace advice  given to you by your health care provider. Make sure you discuss any questions you have with your health care provider.

## 2014-03-29 NOTE — Progress Notes (Signed)
   Subjective:    Patient ID: Preston Berry, male    DOB: 06/17/1986, 28 y.o.   MRN: [id number]  HPI Pt presents to the office to establish care and annual physical. Pt currently not taking any chronic medication at this time. Pt's only complaint is acid reflux. Pt states he has constant heartburn, it wakes him up at time. Pt states he "goes to bed with heartburn and wakes up with heartburn". Pt states he has tired Tums with mild relief.    Review of Systems  Constitutional: Negative.   HENT: Negative.   Respiratory: Negative.   Cardiovascular: Negative.   Gastrointestinal: Negative.   Endocrine: Negative.   Genitourinary: Negative.   Musculoskeletal: Negative.   Neurological: Negative.   Hematological: Negative.   Psychiatric/Behavioral: Negative.   All other systems reviewed and are negative.      Objective:   Physical Exam  Vitals reviewed. Constitutional: He is oriented to person, place, and time. He appears well-developed and well-nourished. No distress.  HENT:  Head: Normocephalic.  Right Ear: External ear normal.  Left Ear: External ear normal.  Nose: Nose normal.  Mouth/Throat: Oropharynx is clear and moist.  Eyes: Pupils are equal, round, and reactive to light. Right eye exhibits no discharge. Left eye exhibits no discharge.  Neck: Normal range of motion. Neck supple. No thyromegaly present.  Cardiovascular: Normal rate, regular rhythm, normal heart sounds and intact distal pulses.   No murmur heard. Pulmonary/Chest: Effort normal and breath sounds normal. No respiratory distress. He has no wheezes.  Abdominal: Soft. Bowel sounds are normal. He exhibits no distension. There is no tenderness.  Musculoskeletal: Normal range of motion. He exhibits no edema and no tenderness.  Neurological: He is alert and oriented to person, place, and time. He has normal reflexes. No cranial nerve deficit.  Skin: Skin is warm and dry. No rash noted. No erythema.  Psychiatric: He has  a normal mood and affect. His behavior is normal. Judgment and thought content normal.    BP 134/87  Pulse 81  Temp(Src) 98 F (36.7 C) (Oral)  Ht $R'5\' 8"'qq$  (1.727 m)  Wt 208 lb 3.2 oz (94.439 kg)  BMI 31.66 kg/m2       Assessment & Plan:  1. Annual physical exam - CMP14+EGFR - Lipid panel - Vit D  25 hydroxy (rtn osteoporosis monitoring)  2. Gastroesophageal reflux disease with esophagitis - omeprazole (PRILOSEC) 20 MG capsule; Take 1 capsule (20 mg total) by mouth daily.  Dispense: 90 capsule; Refill: 3   Continue all meds Labs pending Health Maintenance reviewed Diet and exercise encouraged RTO 1 year  Evelina Dun, FNP

## 2014-03-30 ENCOUNTER — Other Ambulatory Visit: Payer: Self-pay | Admitting: Family

## 2014-03-30 ENCOUNTER — Telehealth: Payer: Self-pay | Admitting: Family Medicine

## 2014-03-30 DIAGNOSIS — E781 Pure hyperglyceridemia: Secondary | ICD-10-CM

## 2014-03-30 DIAGNOSIS — E559 Vitamin D deficiency, unspecified: Secondary | ICD-10-CM

## 2014-03-30 DIAGNOSIS — E785 Hyperlipidemia, unspecified: Secondary | ICD-10-CM

## 2014-03-30 LAB — CMP14+EGFR
ALK PHOS: 77 IU/L (ref 39–117)
ALT: 63 IU/L — AB (ref 0–44)
AST: 26 IU/L (ref 0–40)
Albumin/Globulin Ratio: 1.7 (ref 1.1–2.5)
Albumin: 4.6 g/dL (ref 3.5–5.5)
BUN / CREAT RATIO: 17 (ref 8–19)
BUN: 12 mg/dL (ref 6–20)
CHLORIDE: 97 mmol/L (ref 97–108)
CO2: 25 mmol/L (ref 18–29)
Calcium: 10.2 mg/dL (ref 8.7–10.2)
Creatinine, Ser: 0.69 mg/dL — ABNORMAL LOW (ref 0.76–1.27)
GFR calc Af Amer: 149 mL/min/{1.73_m2} (ref >59)
GFR calc non Af Amer: 129 mL/min/{1.73_m2} (ref >59)
Globulin, Total: 2.7 g/dL (ref 1.5–4.5)
Glucose: 89 mg/dL (ref 65–99)
POTASSIUM: 4.2 mmol/L (ref 3.5–5.2)
SODIUM: 139 mmol/L (ref 134–144)
Total Bilirubin: 0.2 mg/dL (ref 0.0–1.2)
Total Protein: 7.3 g/dL (ref 6.0–8.5)

## 2014-03-30 LAB — LIPID PANEL
CHOLESTEROL TOTAL: 239 mg/dL — AB (ref 100–199)
Chol/HDL Ratio: 8.5 ratio units — ABNORMAL HIGH (ref 0.0–5.0)
HDL: 28 mg/dL — ABNORMAL LOW (ref >39)
TRIGLYCERIDES: 559 mg/dL — AB (ref 0–149)

## 2014-03-30 LAB — VITAMIN D 25 HYDROXY (VIT D DEFICIENCY, FRACTURES): Vit D, 25-Hydroxy: 23.5 ng/mL — ABNORMAL LOW (ref 30.0–100.0)

## 2014-03-30 MED ORDER — VITAMIN D (ERGOCALCIFEROL) 1.25 MG (50000 UNIT) PO CAPS: 50000.0000 [IU] | ORAL_CAPSULE | ORAL | Status: DC

## 2014-03-30 MED ORDER — ATORVASTATIN CALCIUM 40 MG PO TABS: 40.0000 mg | ORAL_TABLET | Freq: Every day | ORAL | Status: DC

## 2014-03-30 MED ORDER — FENOFIBRIC ACID 135 MG PO CPDR: 1.0000 | DELAYED_RELEASE_CAPSULE | Freq: Every day | ORAL | Status: DC

## 2014-03-30 NOTE — Telephone Encounter (Signed)
Patient aware.

## 2014-03-30 NOTE — Telephone Encounter (Signed)
Message copied by Waverly Ferrari on Thu Mar 30, 2014  9:25 AM ------      Message from: Lenna Gilford, [place] A      Created: Thu Mar 30, 2014  8:47 AM       Kidney  function stable      Liver function slightly elevated      Triglycerides levels critical high!!- Rx sent to pharmacy- Pt needs to be on low fat diet and have labs redrawn in 6 months to check cholesterol levels and liver function!      Vit D levels low- RX sent to pharmacy       ------

## 2014-04-10 ENCOUNTER — Telehealth: Payer: Self-pay | Admitting: Family Medicine

## 2014-04-10 ENCOUNTER — Ambulatory Visit (INDEPENDENT_AMBULATORY_CARE_PROVIDER_SITE_OTHER): Payer: Medicare Other | Admitting: Nurse Practitioner

## 2014-04-10 ENCOUNTER — Encounter: Payer: Self-pay | Admitting: Nurse Practitioner

## 2014-04-10 VITALS — BP 150/84 | HR 79 | Temp 97.6°F | Ht 68.0 in | Wt 207.2 lb

## 2014-04-10 DIAGNOSIS — K648 Other hemorrhoids: Secondary | ICD-10-CM | POA: Diagnosis not present

## 2014-04-10 DIAGNOSIS — F410 Panic disorder [episodic paroxysmal anxiety] without agoraphobia: Secondary | ICD-10-CM

## 2014-04-10 DIAGNOSIS — K644 Residual hemorrhoidal skin tags: Secondary | ICD-10-CM

## 2014-04-10 DIAGNOSIS — F319 Bipolar disorder, unspecified: Secondary | ICD-10-CM

## 2014-04-10 HISTORY — DX: Bipolar disorder, unspecified: F31.9

## 2014-04-10 HISTORY — DX: Panic disorder (episodic paroxysmal anxiety): F41.0

## 2014-04-10 MED ORDER — BUSPIRONE HCL 15 MG PO TABS: 15.0000 mg | ORAL_TABLET | Freq: Three times a day (TID) | ORAL | Status: DC

## 2014-04-10 NOTE — Progress Notes (Signed)
   Subjective:    Patient ID: Preston Berry, male    DOB: September 02, 1985, 28 y.o.   MRN: [id number]  HPI Patient complaint of rectal hemorrhoid that he noticed yesterday. He denies any frank blood in his stool. He has used preparation-H. No prior episode.   Arm Cramping: he reported the cramping starting yesterday, he had similar episode in the past. He reported the cramp happens when he is very stress. He has history of bipolar and has stop taking all his medication, was on seroquel and dexadrine.   * Had labs drawn about 1 month ago which were all normal.  Review of Systems  Constitutional: Negative.   HENT: Negative.   Respiratory: Negative.   Cardiovascular: Negative.   Genitourinary: Negative.   Neurological: Negative.   Psychiatric/Behavioral: Negative.   All other systems reviewed and are negative.      Objective:   Physical Exam  Constitutional: He is oriented to person, place, and time. He appears well-developed and well-nourished.  Cardiovascular: Normal rate, regular rhythm and normal heart sounds.   Pulmonary/Chest: Effort normal and breath sounds normal.  Genitourinary:  Tender nonthrombosed external hemorrhoid  Neurological: He is alert and oriented to person, place, and time.  Skin: Skin is warm and dry.  Psychiatric: He has a normal mood and affect. His behavior is normal. Judgment and thought content normal.   BP 150/84  Pulse 79  Temp(Src) 97.6 F (36.4 C) (Oral)  Ht 5\' 8"  (1.727 m)  Wt 207 lb 3.2 oz (93.985 kg)  BMI 31.51 kg/m2        Assessment & Plan:  1. Bipolar affective disorder, most recent episode unspecified type, remission status unspecified Need to get back into counseling List of counselors given  2. Panic attacks Stress managemnt - busPIRone (BUSPAR) 15 MG tablet; Take 1 tablet (15 mg total) by mouth 3 (three) times daily.  Dispense: 30 tablet; Refill: 0  3. External hemorrhoid Preparation H OTC Increase fiber in diet Stool  softners RTO prn  Mary-Margaret Hassell Done, FNP

## 2014-04-10 NOTE — Telephone Encounter (Signed)
Appt given for today 

## 2014-04-10 NOTE — Patient Instructions (Signed)
[facility]- Therapist [address] [place] ,[place] [address] [phone] Children limited to anxiety and depression- NO ADD/ADHD Does not accept Medicaid  [facility] [address] [place], [place] [phone] Does see children Does accept medicaid Will assess for Autism but not treat  [facility] [address] [place] [phone] Does see children  Does accept Medicaid Medication management- substance abuse- bipolar- grief- family-marriage- OCD- Anxiety- PTSD  [facility] [address] [place]  [phone] Does see children Does accept medicaid They do perform psychological testing  [facility] [address] [place] Schedule through [employer] [phone] Patient must call and make own appointment Does se children Does accept Medicaid  [facility] [address] [place], [place] [phone] Sees Children 7-10 accompanied by an adult, 11 and up by themselves Does accept Medicaid Will see patients with- substance abuse-ADHD-ADD-Bipolar-Domestic violence-Marriage counseling- Family Counseling and sexual abuse  [facility]- Psychologist and Psychiatrist [address] [place] [phone] Does see children Does accept Medicaid  [facility] [address] [place] [phone]  Dr. Lugo-  Psychiatrist [address] [place], [place] [phone] Specializes in ADHD and addictions They do ADHD testing Suboxone clinic  [facility] [address] [place] [phone] Does Child psychological testing  [facility] [address] [place] [phone] Does Accept Medicaid Evaluates for Autism  [facility] [address] [place] [phone] Does Not accept Medicaid Does do adult ADD  evaluations  Dr. Akinlayo [address] [place] [phone] Does not Take Medicaid Sees ADD and ADHD for treatment      [facility] [address] [place], [place] [address] [phone] Takes Medicaid WIll see children as young as 3         

## 2014-04-18 ENCOUNTER — Telehealth: Payer: Self-pay | Admitting: Family

## 2014-04-18 NOTE — Telephone Encounter (Signed)
Child answered the phone and said Dad was working outside and he could not hear her.  (Patient asking questions about his liver.)

## 2014-04-25 ENCOUNTER — Ambulatory Visit (INDEPENDENT_AMBULATORY_CARE_PROVIDER_SITE_OTHER): Payer: Medicare Other | Admitting: Family Medicine

## 2014-04-25 VITALS — BP 124/60 | HR 61 | Temp 98.8°F | Ht 68.0 in | Wt 213.6 lb

## 2014-04-25 DIAGNOSIS — M545 Low back pain, unspecified: Secondary | ICD-10-CM

## 2014-04-25 MED ORDER — NAPROXEN 500 MG PO TABS: 500.0000 mg | ORAL_TABLET | Freq: Two times a day (BID) | ORAL | Status: DC

## 2014-04-25 MED ORDER — CYCLOBENZAPRINE HCL 10 MG PO TABS: 10.0000 mg | ORAL_TABLET | Freq: Three times a day (TID) | ORAL | Status: DC | PRN

## 2014-04-25 NOTE — Progress Notes (Signed)
   Subjective:    Patient ID: Preston Berry, male    DOB: 1985/09/08, 28 y.o.   MRN: [id number]  HPI This 28 y.o. male presents for evaluation of c/o back pain for a week.  He denies any injury and states he has a lot of back pain because when he was a child he used to jump off of roofs and this injured his back and since he has sporadic flares of back pain.  He states he has scoliosis.   Review of Systems C/o back pain No chest pain, SOB, HA, dizziness, vision change, N/V, diarrhea, constipation, dysuria, urinary urgency or frequency or rash.      Objective:   Physical Exam  Vital signs noted  Well developed well nourished male.  HEENT - Head atraumatic Normocephalic Respiratory - Lungs CTA bilateral Cardiac - RRR S1 and S2 without murmur MS - TTP left lumbar paraspinous muscles      Assessment & Plan:  Left-sided low back pain without sciatica - Plan: naproxen (NAPROSYN) 500 MG tablet, cyclobenzaprine (FLEXERIL) 10 MG tablet  Lysbeth Penner FNP

## 2014-04-25 NOTE — Telephone Encounter (Signed)
Patient is having back pain and radiates to side not having in trouble urinating. What do you suggest?

## 2014-04-25 NOTE — Telephone Encounter (Signed)
Not related to tilted liver- tilted liver should not cause many problems

## 2014-04-25 NOTE — Telephone Encounter (Signed)
Patient aware appintment made today with [facility]

## 2014-07-17 ENCOUNTER — Telehealth: Payer: Self-pay | Admitting: Family Medicine

## 2014-07-17 DIAGNOSIS — R1032 Left lower quadrant pain: Secondary | ICD-10-CM | POA: Diagnosis not present

## 2014-07-17 DIAGNOSIS — K760 Fatty (change of) liver, not elsewhere classified: Secondary | ICD-10-CM | POA: Diagnosis not present

## 2014-07-17 DIAGNOSIS — K640 First degree hemorrhoids: Secondary | ICD-10-CM | POA: Diagnosis not present

## 2014-07-17 NOTE — Telephone Encounter (Signed)
Patient aware we are out of appointments for today and offered appointment for tomorrow but states he is unable to come tomorrow and states he will go to ER today.

## 2014-08-31 DIAGNOSIS — F314 Bipolar disorder, current episode depressed, severe, without psychotic features: Secondary | ICD-10-CM | POA: Diagnosis not present

## 2014-08-31 DIAGNOSIS — F411 Generalized anxiety disorder: Secondary | ICD-10-CM | POA: Diagnosis not present

## 2014-09-05 DIAGNOSIS — R51 Headache: Secondary | ICD-10-CM | POA: Diagnosis not present

## 2014-09-19 ENCOUNTER — Ambulatory Visit (INDEPENDENT_AMBULATORY_CARE_PROVIDER_SITE_OTHER): Payer: Medicare Other | Admitting: Family

## 2014-09-19 ENCOUNTER — Encounter: Payer: Self-pay | Admitting: Family

## 2014-09-19 VITALS — BP 126/84 | HR 66 | Temp 97.8°F | Ht 68.0 in | Wt 212.2 lb

## 2014-09-19 DIAGNOSIS — F339 Major depressive disorder, recurrent, unspecified: Secondary | ICD-10-CM | POA: Insufficient documentation

## 2014-09-19 DIAGNOSIS — K2100 Gastro-esophageal reflux disease with esophagitis, without bleeding: Secondary | ICD-10-CM

## 2014-09-19 DIAGNOSIS — E785 Hyperlipidemia, unspecified: Secondary | ICD-10-CM | POA: Diagnosis not present

## 2014-09-19 DIAGNOSIS — F329 Major depressive disorder, single episode, unspecified: Secondary | ICD-10-CM | POA: Diagnosis not present

## 2014-09-19 DIAGNOSIS — F410 Panic disorder [episodic paroxysmal anxiety] without agoraphobia: Secondary | ICD-10-CM

## 2014-09-19 DIAGNOSIS — F32A Depression, unspecified: Secondary | ICD-10-CM

## 2014-09-19 NOTE — Patient Instructions (Signed)

## 2014-09-19 NOTE — Progress Notes (Signed)
   Subjective:    Patient ID: Preston Berry, male    DOB: [birth year], 29 y.o.   MRN: [id number]  HPI Pt presents to the office today to have DMV paperwork filled out. Pt denies any headache, palpitations, SOB, or edema at this time.     Review of Systems  Constitutional: Negative.   HENT: Negative.   Respiratory: Negative.   Cardiovascular: Negative.   Gastrointestinal: Negative.   Endocrine: Negative.   Genitourinary: Negative.   Musculoskeletal: Negative.   Neurological: Negative.   Hematological: Negative.   Psychiatric/Behavioral: Negative.   All other systems reviewed and are negative.      Objective:   Physical Exam  Constitutional: He is oriented to person, place, and time. He appears well-developed and well-nourished. No distress.  HENT:  Head: Normocephalic.  Right Ear: External ear normal.  Left Ear: External ear normal.  Mouth/Throat: Oropharynx is clear and moist.  Eyes: Pupils are equal, round, and reactive to light. Right eye exhibits no discharge. Left eye exhibits no discharge.  Neck: Normal range of motion. Neck supple. No thyromegaly present.  Cardiovascular: Normal rate, regular rhythm, normal heart sounds and intact distal pulses.   No murmur heard. Pulmonary/Chest: Effort normal and breath sounds normal. No respiratory distress. He has no wheezes.  Abdominal: Soft. Bowel sounds are normal. He exhibits no distension. There is no tenderness.  Musculoskeletal: Normal range of motion. He exhibits no edema or tenderness.  Neurological: He is alert and oriented to person, place, and time. He has normal reflexes. No cranial nerve deficit.  Skin: Skin is warm and dry. No rash noted. No erythema.  Psychiatric: He has a normal mood and affect. His behavior is normal. Judgment and thought content normal.  Vitals reviewed.     BP 126/84 mmHg  Pulse 66  Temp(Src) 97.8 F (36.6 C) (Oral)  Ht 5\' 8"  (1.727 m)  Wt 212 lb 3.2 oz (96.253 kg)  BMI 32.27  kg/m2     Assessment & Plan:  1. Hyperlipidemia  2. Panic attacks  3. Gastroesophageal reflux disease with esophagitis  4. Depression  DMV paper filled out for pt. Pt told he had to be cleared by ophthalmologist  RTO prn  Evelina Dun, FNP

## 2014-10-26 DIAGNOSIS — J029 Acute pharyngitis, unspecified: Secondary | ICD-10-CM | POA: Diagnosis not present

## 2014-10-26 DIAGNOSIS — R05 Cough: Secondary | ICD-10-CM | POA: Diagnosis not present

## 2014-10-27 ENCOUNTER — Encounter: Payer: Self-pay | Admitting: Nurse Practitioner

## 2014-10-27 ENCOUNTER — Ambulatory Visit (INDEPENDENT_AMBULATORY_CARE_PROVIDER_SITE_OTHER): Payer: Medicare Other | Admitting: Nurse Practitioner

## 2014-10-27 VITALS — BP 131/78 | HR 84 | Temp 98.9°F | Ht 68.0 in | Wt 209.8 lb

## 2014-10-27 DIAGNOSIS — J209 Acute bronchitis, unspecified: Secondary | ICD-10-CM

## 2014-10-27 DIAGNOSIS — R509 Fever, unspecified: Secondary | ICD-10-CM | POA: Diagnosis not present

## 2014-10-27 LAB — POCT INFLUENZA A/B
INFLUENZA A, POC: NEGATIVE
INFLUENZA B, POC: NEGATIVE

## 2014-10-27 MED ORDER — METHYLPREDNISOLONE ACETATE 80 MG/ML IJ SUSP
80.0000 mg | Freq: Once | INTRAMUSCULAR | Status: AC
  Administered 2014-10-27: 80 mg via INTRAMUSCULAR

## 2014-10-27 NOTE — Progress Notes (Signed)
  Subjective:     Preston Berry is a 29 y.o. male who presents for evaluation of sinus pain. Symptoms include: congestion, cough, purulent rhinorrhea and sore throat. Onset of symptoms was 3 days ago. Symptoms have been gradually worsening since that time. Past history is significant for no history of pneumonia or bronchitis. Patient is a non-smoker. * went to er yesterday and got z pak- no better The following portions of the patient's history were reviewed and updated as appropriate: allergies, current medications, past family history, past medical history, past social history, past surgical history and problem list.  Review of Systems Pertinent items are noted in HPI.   Objective:    General appearance: alert and cooperative Eyes: conjunctivae/corneas clear. PERRL, EOM's intact. Fundi benign. Ears: normal TM's and external ear canals both ears Nose: Nares normal. Septum midline. Mucosa normal. No drainage or sinus tenderness. Throat: lips, mucosa, and tongue normal; teeth and gums normal Neck: no adenopathy, no carotid bruit, no JVD, supple, symmetrical, trachea midline and thyroid not enlarged, symmetric, no tenderness/mass/nodules Lungs: clear to auscultation bilaterally and dry cough Heart: regular rate and rhythm, S1, S2 normal, no murmur, click, rub or gallop    Results for orders placed or performed in visit on 10/27/14  POCT Influenza A/B  Result Value Ref Range   Influenza A, POC Negative    Influenza B, POC Negative       Assessment:    Acute bronchitis.    Plan:  1. Take meds as prescribed 2. Use a cool mist humidifier especially during the winter months and when heat has been humid. 3. Use saline nose sprays frequently 4. Saline irrigations of the nose can be very helpful if done frequently.  * 4X daily for 1 week*  * Use of a nettie pot can be helpful with this. Follow directions with this* 5. Drink plenty of fluids 6. Keep thermostat turn down low 7.For any  cough or congestion  Use plain Mucinex- regular strength or max strength is fine   * Children- consult with Pharmacist for dosing 8. For fever or aces or pains- take tylenol or ibuprofen appropriate for age and weight.  * for fevers greater than 101 orally you may alternate ibuprofen and tylenol every  3 hours.   Meds ordered this encounter  Medications  . methylPREDNISolone acetate (DEPO-MEDROL) injection 80 mg    Sig:     Mary-Margaret Hassell Done, FNP

## 2014-10-27 NOTE — Addendum Note (Signed)
Addended by: Chevis Pretty on: 10/27/2014 06:40 PM   Modules accepted: Level of Service

## 2014-10-27 NOTE — Patient Instructions (Signed)

## 2014-11-23 DIAGNOSIS — F314 Bipolar disorder, current episode depressed, severe, without psychotic features: Secondary | ICD-10-CM | POA: Diagnosis not present

## 2014-12-01 DIAGNOSIS — M62830 Muscle spasm of back: Secondary | ICD-10-CM | POA: Diagnosis not present

## 2014-12-01 DIAGNOSIS — R110 Nausea: Secondary | ICD-10-CM | POA: Diagnosis not present

## 2014-12-01 DIAGNOSIS — M62838 Other muscle spasm: Secondary | ICD-10-CM | POA: Diagnosis not present

## 2014-12-01 DIAGNOSIS — Z79899 Other long term (current) drug therapy: Secondary | ICD-10-CM | POA: Diagnosis not present

## 2014-12-01 DIAGNOSIS — G8929 Other chronic pain: Secondary | ICD-10-CM | POA: Diagnosis not present

## 2014-12-01 DIAGNOSIS — M545 Low back pain: Secondary | ICD-10-CM | POA: Diagnosis not present

## 2014-12-07 ENCOUNTER — Encounter: Payer: Self-pay | Admitting: Family

## 2014-12-07 ENCOUNTER — Ambulatory Visit (INDEPENDENT_AMBULATORY_CARE_PROVIDER_SITE_OTHER): Payer: Medicare Other

## 2014-12-07 ENCOUNTER — Ambulatory Visit (INDEPENDENT_AMBULATORY_CARE_PROVIDER_SITE_OTHER): Payer: Medicare Other | Admitting: Family

## 2014-12-07 VITALS — BP 111/74 | HR 82 | Temp 97.3°F | Ht 68.0 in | Wt 206.0 lb

## 2014-12-07 DIAGNOSIS — M545 Low back pain, unspecified: Secondary | ICD-10-CM

## 2014-12-07 DIAGNOSIS — R52 Pain, unspecified: Secondary | ICD-10-CM

## 2014-12-07 MED ORDER — MELOXICAM 15 MG PO TABS
15.0000 mg | ORAL_TABLET | Freq: Every day | ORAL | Status: DC
Start: 2014-12-07 — End: 2015-04-03

## 2014-12-07 MED ORDER — METHYLPREDNISOLONE ACETATE 80 MG/ML IJ SUSP
80.0000 mg | Freq: Once | INTRAMUSCULAR | Status: AC
  Administered 2014-12-07: 80 mg via INTRAMUSCULAR

## 2014-12-07 MED ORDER — KETOROLAC TROMETHAMINE 60 MG/2ML IM SOLN
60.0000 mg | Freq: Once | INTRAMUSCULAR | Status: AC
  Administered 2014-12-07: 60 mg via INTRAMUSCULAR

## 2014-12-07 MED ORDER — CYCLOBENZAPRINE HCL 10 MG PO TABS: 10.0000 mg | ORAL_TABLET | Freq: Three times a day (TID) | ORAL | Status: DC | PRN

## 2014-12-07 NOTE — Progress Notes (Addendum)
   Subjective:    Patient ID: Preston Berry, male    DOB: [birth year], 29 y.o.   MRN: [id number]  Back Pain This is a new problem. The current episode started in the past 7 days. The problem occurs constantly. The problem has been waxing and waning since onset. The pain is present in the lumbar spine. The quality of the pain is described as aching. The pain is at a severity of 8/10. The pain is moderate. The symptoms are aggravated by standing. Pertinent negatives include no bladder incontinence, bowel incontinence, dysuria, leg pain, numbness or tingling. He has tried muscle relaxant and NSAIDs for the symptoms. The treatment provided mild relief.      Review of Systems  Constitutional: Negative.   HENT: Negative.   Respiratory: Negative.   Cardiovascular: Negative.   Gastrointestinal: Negative.  Negative for bowel incontinence.  Endocrine: Negative.   Genitourinary: Negative.  Negative for bladder incontinence and dysuria.  Musculoskeletal: Positive for back pain.  Neurological: Negative.  Negative for tingling and numbness.  Hematological: Negative.   Psychiatric/Behavioral: Negative.   All other systems reviewed and are negative.      Objective:   Physical Exam  Constitutional: He is oriented to person, place, and time. He appears well-developed and well-nourished. No distress.  HENT:  Head: Normocephalic.  Eyes: Pupils are equal, round, and reactive to light. Right eye exhibits no discharge. Left eye exhibits no discharge.  Neck: Normal range of motion. Neck supple. No thyromegaly present.  Cardiovascular: Normal rate, regular rhythm, normal heart sounds and intact distal pulses.   No murmur heard. Pulmonary/Chest: Effort normal and breath sounds normal. No respiratory distress. He has no wheezes.  Abdominal: Soft. Bowel sounds are normal. He exhibits no distension. There is no tenderness.  Musculoskeletal: Normal range of motion. He exhibits tenderness (middle back with  massage). He exhibits no edema.  Neurological: He is alert and oriented to person, place, and time. He has normal reflexes. No cranial nerve deficit.  Skin: Skin is warm and dry. No rash noted. No erythema.  Psychiatric: He has a normal mood and affect. His behavior is normal. Judgment and thought content normal.  Vitals reviewed.   BP 111/74 mmHg  Pulse 82  Temp(Src) 97.3 F (36.3 C) (Oral)  Ht _0  (1.727 m)  Wt 206 lb (93.441 kg)  BMI 31.33 kg/m2  X-ray- WNL Preliminary reading by Evelina Dun, FNP [facility]      Assessment & Plan:  1. Pain - DG Lumbar Spine 2-3 Views; Future  2. Left-sided low back pain without sciatica -Rest -Ice and heat -Sedation precaution discussed -No other NSAID's while taking Mobic -RTO prn - methylPREDNISolone acetate (DEPO-MEDROL) injection 80 mg; Inject 1 mL (80 mg total) into the muscle once. - ketorolac (TORADOL) injection 60 mg; Inject 2 mLs (60 mg total) into the muscle once. - meloxicam (MOBIC) 15 MG tablet; Take 1 tablet (15 mg total) by mouth daily.  Dispense: 30 tablet; Refill: 0 - cyclobenzaprine (FLEXERIL) 10 MG tablet; Take 1 tablet (10 mg total) by mouth 3 (three) times daily as needed for muscle spasms.  Dispense: 30 tablet; Refill: 0 - BMP8+EGFR  Evelina Dun, FNP

## 2014-12-07 NOTE — Patient Instructions (Signed)
Thoracic Strain Thoracic strain is an injury to the muscles of the upper back. A mild strain may take only 1 week to heal. Torn muscles or tendons may take 6 weeks to 2 months to heal. HOME CARE  Put ice on the injured area.  Put ice in a plastic bag.  Place a towel between your skin and the bag.  Leave the ice on for 15-20 minutes, 03-04 times a day, for the first 2 days.  Only take medicine as told by your doctor.  Go to physical therapy and perform exercises as told by your doctor.  Use wraps and back braces as told by your doctor.  Warm up before being active. GET HELP RIGHT AWAY IF:   There is more bruising, puffiness (swelling), or pain.  Medicine does not help the pain.  You have trouble breathing, chest pain, or a fever.  Your problems seem to be getting worse, not better. MAKE SURE YOU:   Understand these instructions.  Will watch your condition.  Will get help right away if you are not doing well or get worse. Document Released: 12/10/2007 Document Revised: 09/15/2011 Document Reviewed: 08/12/2010 [facility] Patient Information 2015 [place], [place]. This information is not intended to replace advice given to you by your health care provider. Make sure you discuss any questions you have with your health care provider.

## 2015-01-13 ENCOUNTER — Ambulatory Visit (INDEPENDENT_AMBULATORY_CARE_PROVIDER_SITE_OTHER): Payer: Medicare Other | Admitting: Physician Assistant

## 2015-01-13 VITALS — BP 122/78 | HR 64 | Temp 97.8°F | Ht 68.0 in | Wt 200.6 lb

## 2015-01-13 DIAGNOSIS — B86 Scabies: Secondary | ICD-10-CM

## 2015-01-13 DIAGNOSIS — Z202 Contact with and (suspected) exposure to infections with a predominantly sexual mode of transmission: Secondary | ICD-10-CM | POA: Diagnosis not present

## 2015-01-13 MED ORDER — PERMETHRIN 5 % EX CREA: TOPICAL_CREAM | CUTANEOUS | Status: DC

## 2015-01-13 MED ORDER — DOXYCYCLINE HYCLATE 100 MG PO CAPS: 100.0000 mg | ORAL_CAPSULE | Freq: Two times a day (BID) | ORAL | Status: DC

## 2015-01-13 NOTE — Progress Notes (Signed)
   Subjective:    Patient ID: Preston Berry, male    DOB: 14-Mar-1986, 29 y.o.   MRN: [id number]  HPI 29 y/o male presents with rash and states that his girlfriend has been treated for Chlamydia. He also has been treated for scabies and is having recurrent symptoms of rash between fingers and hands. His girlfriend has not been treated for scabies.     Review of Systems  Constitutional: Negative.   HENT: Negative.   Eyes: Negative.   Respiratory: Negative.   Cardiovascular: Negative.   Gastrointestinal: Negative.   Endocrine: Negative.   Genitourinary: Positive for dysuria (burning ) and discharge.  Musculoskeletal: Negative.   Skin:       Rash on hands and between fingers         Objective:   Physical Exam  Constitutional: He is oriented to person, place, and time. He appears well-developed and well-nourished. No distress.  HENT:  Head: Normocephalic.  Pulmonary/Chest: Effort normal and breath sounds normal.  Neurological: He is alert and oriented to person, place, and time.  Skin: Rash (erythematous papules between webspaces of fingers and on bilateral hands) noted. He is not diaphoretic.  Psychiatric: He has a normal mood and affect. His behavior is normal. Judgment and thought content normal.  Nursing note and vitals reviewed.         Assessment & Plan:  1. Chlamydia contact, untreated -No sex until finished with medication  - doxycycline (VIBRAMYCIN) 100 MG capsule; Take 1 capsule (100 mg total) by mouth 2 (two) times daily.  Dispense: 14 capsule; Refill: 0  2. Scabies  - permethrin (ELIMITE) 5 % cream; Apply neck down. Leave on overnight. Rinse off in morning. Repeat in 7 days  Dispense: 60 g; Refill: 1  Info given on scabies treatment and home care.   Kaden Dunkel A. Benjamin Stain PA-C

## 2015-01-13 NOTE — Patient Instructions (Signed)

## 2015-02-19 ENCOUNTER — Encounter: Payer: Self-pay | Admitting: Family Medicine

## 2015-02-19 ENCOUNTER — Encounter (INDEPENDENT_AMBULATORY_CARE_PROVIDER_SITE_OTHER): Payer: Self-pay

## 2015-02-19 ENCOUNTER — Ambulatory Visit (INDEPENDENT_AMBULATORY_CARE_PROVIDER_SITE_OTHER): Payer: Medicare Other | Admitting: Family Medicine

## 2015-02-19 VITALS — BP 122/76 | HR 62 | Temp 97.2°F | Ht 68.0 in | Wt 203.6 lb

## 2015-02-19 DIAGNOSIS — R740 Nonspecific elevation of levels of transaminase and lactic acid dehydrogenase [LDH]: Secondary | ICD-10-CM

## 2015-02-19 DIAGNOSIS — R109 Unspecified abdominal pain: Secondary | ICD-10-CM | POA: Insufficient documentation

## 2015-02-19 DIAGNOSIS — R7401 Elevation of levels of liver transaminase levels: Secondary | ICD-10-CM | POA: Insufficient documentation

## 2015-02-19 DIAGNOSIS — Z77011 Contact with and (suspected) exposure to lead: Secondary | ICD-10-CM

## 2015-02-19 DIAGNOSIS — R1012 Left upper quadrant pain: Secondary | ICD-10-CM | POA: Diagnosis not present

## 2015-02-19 DIAGNOSIS — R1011 Right upper quadrant pain: Secondary | ICD-10-CM

## 2015-02-19 DIAGNOSIS — R1013 Epigastric pain: Secondary | ICD-10-CM

## 2015-02-19 HISTORY — DX: Elevation of levels of liver transaminase levels: R74.01

## 2015-02-19 HISTORY — DX: Contact with and (suspected) exposure to lead: Z77.011

## 2015-02-19 HISTORY — DX: Unspecified abdominal pain: R10.9

## 2015-02-19 MED ORDER — DICYCLOMINE HCL 20 MG PO TABS: 20.0000 mg | ORAL_TABLET | Freq: Four times a day (QID) | ORAL | Status: DC

## 2015-02-19 MED ORDER — DEXLANSOPRAZOLE 30 MG PO CPDR: 30.0000 mg | DELAYED_RELEASE_CAPSULE | Freq: Every day | ORAL | Status: DC

## 2015-02-19 NOTE — Assessment & Plan Note (Addendum)
Unclear etiology with some mild tenderness to palpation in bilateral upper quadrants on exam Check labs, ultrasound with RUQ and LUQ tenderness, eval for gall bladder pathology No organomegaly obvious but I think the ultrasound will help. Some improvement with GI cocktail so will escalate PPI therapy from prilosec to dexilant Follow-up 2-3 weeks

## 2015-02-19 NOTE — Assessment & Plan Note (Signed)
Mild elevation in LDL last checked Patient is concerned about high iron levels given his old types in his house Check ferritin level

## 2015-02-19 NOTE — Progress Notes (Addendum)
Patient ID: Preston Berry, male   DOB: April 18, 1986, 29 y.o.   MRN: [id number]   HPI  Patient presents today for same-day appointment for abdominal pain  Patient explains he has about 2 months history of left upper quadrant pain. He describes it as a baseline dull achy pain that stays around 3-4 out of 10. Several times a day he has sharp intermittent bursts of pain that are as severe as 6-7 out of 10 and last about 1-2 hours in duration. He cannot figure out any aggravating or alleviating factors. He denies diarrhea, constipation, and nausea. He states he has a long-standing issue with vomiting clear liquid, small amount, after he eats approximately once or twice per month. This is not changed He describes he has 2 easy stools per day. He denies fever, chills, change in appetite  He is concerned about lead and iron as he has old [pipes that are in poor condition his house. He has plans to work and the landlord replaced.  PMH: Smoking status noted ROS: Per HPI  Objective: BP 122/76 mmHg  Pulse 62  Temp(Src) 97.2 F (36.2 C) (Oral)  Ht '5\' 8"'  (1.727 m)  Wt 203 lb 9.6 oz (92.352 kg)  BMI 30.96 kg/m2 Gen: NAD, alert, cooperative with exam HEENT: NCAT CV: RRR, good S1/S2, no murmur Resp: CTABL, no wheezes, non-labored Abd: Soft, tenderness to palpation of bilateral upper quadrants, unclear murphy sign, positive bowel sounds Ext: No edema, warm Neuro: Alert and oriented, No gross deficits  Assessment and plan:  Abdominal pain Unclear etiology with some mild tenderness to palpation in bilateral upper quadrants on exam Check labs, ultrasound with RUQ and LUQ tenderness, eval for gall bladder pathology No organomegaly obvious but I think the ultrasound will help. Some improvement with GI cocktail so will escalate PPI therapy from prilosec to dexilant Follow-up 2-3 weeks  Transaminitis Mild elevation in LDL last checked Patient is concerned about high iron levels given his old  types in his house Check ferritin level    Orders Placed This Encounter  Procedures  . US Abdomen Complete    Standing Status: Future     Number of Occurrences:      Standing Expiration Date: 04/20/2016    Order Specific Question:  Reason for Exam (SYMPTOM  OR DIAGNOSIS REQUIRED)    Answer:  LUQ pain, Tenderness to palpation    Order Specific Question:  Preferred imaging location?    Answer:  [facility]  . CBC with Differential  . CMP14+EGFR  . Lead, Blood  . Ferritin    Meds ordered this encounter  Medications  . DISCONTD: dicyclomine (BENTYL) 20 MG tablet    Sig: Take 1 tablet (20 mg total) by mouth every 6 (six) hours.    Dispense:  90 tablet    Refill:  0  . Dexlansoprazole (DEXILANT) 30 MG capsule    Sig: Take 1 capsule (30 mg total) by mouth daily.    Dispense:  30 capsule    Refill:  [place], MD [place] 02/19/2015, 2:16 PM

## 2015-02-19 NOTE — Patient Instructions (Addendum)
Great to meet you!  Come back in 2-3 weeks after your ultrasound is done  Try the bentyl to see if that helps.

## 2015-02-20 LAB — CBC WITH DIFFERENTIAL/PLATELET
BASOS ABS: 0 10*3/uL (ref 0.0–0.2)
Basos: 1 %
EOS (ABSOLUTE): 0.2 10*3/uL (ref 0.0–0.4)
Eos: 2 %
HEMOGLOBIN: 14.6 g/dL (ref 12.6–17.7)
Hematocrit: 43.4 % (ref 37.5–51.0)
Immature Grans (Abs): 0 10*3/uL (ref 0.0–0.1)
Immature Granulocytes: 0 %
LYMPHS ABS: 2.7 10*3/uL (ref 0.7–3.1)
Lymphs: 44 %
MCH: 29.7 pg (ref 26.6–33.0)
MCHC: 33.6 g/dL (ref 31.5–35.7)
MCV: 88 fL (ref 79–97)
MONOCYTES: 8 %
Monocytes Absolute: 0.5 10*3/uL (ref 0.1–0.9)
Neutrophils Absolute: 2.8 10*3/uL (ref 1.4–7.0)
Neutrophils: 45 %
Platelets: 352 10*3/uL (ref 150–379)
RBC: 4.92 x10E6/uL (ref 4.14–5.80)
RDW: 13.5 % (ref 12.3–15.4)
WBC: 6.2 10*3/uL (ref 3.4–10.8)

## 2015-02-20 LAB — CMP14+EGFR
A/G RATIO: 1.8 (ref 1.1–2.5)
ALBUMIN: 4.3 g/dL (ref 3.5–5.5)
ALK PHOS: 71 IU/L (ref 39–117)
ALT: 23 IU/L (ref 0–44)
AST: 16 IU/L (ref 0–40)
BILIRUBIN TOTAL: 0.3 mg/dL (ref 0.0–1.2)
BUN / CREAT RATIO: 14 (ref 8–19)
BUN: 16 mg/dL (ref 6–20)
CHLORIDE: 99 mmol/L (ref 97–108)
CO2: 23 mmol/L (ref 18–29)
Calcium: 9.4 mg/dL (ref 8.7–10.2)
Creatinine, Ser: 1.12 mg/dL (ref 0.76–1.27)
GFR calc non Af Amer: 89 mL/min/{1.73_m2} (ref >59)
GFR, EST AFRICAN AMERICAN: 103 mL/min/{1.73_m2} (ref >59)
GLUCOSE: 95 mg/dL (ref 65–99)
Globulin, Total: 2.4 g/dL (ref 1.5–4.5)
POTASSIUM: 3.7 mmol/L (ref 3.5–5.2)
Sodium: 142 mmol/L (ref 134–144)
TOTAL PROTEIN: 6.7 g/dL (ref 6.0–8.5)

## 2015-02-20 LAB — FERRITIN: FERRITIN: 129 ng/mL (ref 30–400)

## 2015-02-21 ENCOUNTER — Telehealth: Payer: Self-pay | Admitting: Family Medicine

## 2015-02-21 DIAGNOSIS — E785 Hyperlipidemia, unspecified: Secondary | ICD-10-CM

## 2015-02-21 DIAGNOSIS — E781 Pure hyperglyceridemia: Secondary | ICD-10-CM

## 2015-02-21 MED ORDER — ATORVASTATIN CALCIUM 40 MG PO TABS: 40.0000 mg | ORAL_TABLET | Freq: Every day | ORAL | Status: DC

## 2015-02-21 NOTE — Telephone Encounter (Signed)
Call to discuss labs with patient. Patient's fianc, Preston Berry answered. On reviewing his old labs he needs a repeat lipid panel. I asked that he come in for fasting lab at his convenience.  Also of note his kidney function, although normal, has changed from a creatinine of 0.7-1.1 over the last year. This is not an abnormal result but I would like to make sure that he is getting plenty of fluids and not taking NSAIDs very regularly. If he is taking ibuprofen or naproxen regularly he should cut back.  I will asked nursing to call and related to inflammation. I have ordered future lipid panel.  Also looking at his old labs he definitely needs to continue taking Lipitor.  Preston Apple, MD [facility] 02/21/2015, 10:50 AM

## 2015-02-21 NOTE — Addendum Note (Signed)
Addended by: Timmothy Euler on: 02/21/2015 11:17 AM   Modules accepted: Orders

## 2015-02-22 ENCOUNTER — Telehealth: Payer: Self-pay

## 2015-02-22 ENCOUNTER — Ambulatory Visit: Payer: Medicare Other | Admitting: Family Medicine

## 2015-02-22 NOTE — Telephone Encounter (Signed)
[facility] to referrals;  Need to know if pt wants to go to [facility] or [facility] for his Abdominal U/S

## 2015-03-01 ENCOUNTER — Ambulatory Visit (INDEPENDENT_AMBULATORY_CARE_PROVIDER_SITE_OTHER): Payer: Medicare Other | Admitting: Family Medicine

## 2015-03-01 ENCOUNTER — Ambulatory Visit (HOSPITAL_COMMUNITY)
Admission: RE | Admit: 2015-03-01 | Discharge: 2015-03-01 | Disposition: A | Discharge status: Home / Self Care | Payer: Medicare Other | Source: Ambulatory Visit | Attending: Family Medicine | Admitting: Family Medicine

## 2015-03-01 ENCOUNTER — Encounter: Payer: Self-pay | Admitting: Family Medicine

## 2015-03-01 VITALS — BP 137/87 | HR 57 | Temp 98.2°F | Ht 68.0 in | Wt 205.2 lb

## 2015-03-01 DIAGNOSIS — R350 Frequency of micturition: Secondary | ICD-10-CM | POA: Diagnosis not present

## 2015-03-01 DIAGNOSIS — R1012 Left upper quadrant pain: Secondary | ICD-10-CM | POA: Insufficient documentation

## 2015-03-01 DIAGNOSIS — R1032 Left lower quadrant pain: Secondary | ICD-10-CM | POA: Insufficient documentation

## 2015-03-01 DIAGNOSIS — R109 Unspecified abdominal pain: Secondary | ICD-10-CM | POA: Diagnosis not present

## 2015-03-01 LAB — POCT URINALYSIS DIPSTICK
Bilirubin, UA: NEGATIVE
GLUCOSE UA: NEGATIVE
Ketones, UA: NEGATIVE
Leukocytes, UA: NEGATIVE
NITRITE UA: NEGATIVE
PH UA: 5
PROTEIN UA: NEGATIVE
RBC UA: NEGATIVE
Spec Grav, UA: 1.02
UROBILINOGEN UA: NEGATIVE

## 2015-03-01 LAB — POCT UA - MICROSCOPIC ONLY
Bacteria, U Microscopic: NEGATIVE
Casts, Ur, LPF, POC: NEGATIVE
Crystals, Ur, HPF, POC: NEGATIVE
EPITHELIAL CELLS, URINE PER MICROSCOPY: NEGATIVE
Mucus, UA: NEGATIVE
RBC, URINE, MICROSCOPIC: NEGATIVE
WBC, UR, HPF, POC: NEGATIVE
Yeast, UA: NEGATIVE

## 2015-03-01 MED ORDER — IOHEXOL 300 MG/ML  SOLN
100.0000 mL | Freq: Once | INTRAMUSCULAR | Status: AC | PRN
  Administered 2015-03-01: 100 mL via INTRAVENOUS

## 2015-03-01 NOTE — Patient Instructions (Signed)
Great to see you!  If your abdominal pain worsens to the point tha tyou cant bear it please go the ER. If you have fever, bloody diarrhea, or are unable to tolerate food or fluids please get medical attention  We will call when we have results

## 2015-03-01 NOTE — Progress Notes (Signed)
   HPI  Patient presents today for follow-up leftupper quadrant pain.  Patient explains that he has dull achy  3/10 LUQ pain with momentary bouts of sharp stabbing pains the rated a 5-6 out of 10.He states that this is been gradually getting worse over the last few weeks. He has an ultrasound scheduled for this Monday.  He denies fever, chills, sweats, aggravating or alleviating factors, chest pain, dyspnea, diarrhea, bloody diarrhea, vomiting  He states that he is never had pain like this before he does have a history of a kidney stone. He has not noticed any hematuria He has been having urinary frequency, dark yellow urine, and foul smelling urine over the last 2 days.   He has not started dexilant  PMH: Smoking status noted ROS: Per HPI  Objective: BP 137/87 mmHg  Pulse 57  Temp(Src) 98.2 F (36.8 C) (Oral)  Ht 5\' 8"  (1.727 m)  Wt 205 lb 3.2 oz (93.078 kg)  BMI 31.21 kg/m2 Gen: NAD, alert, cooperative with exam HEENT: NCAT CV: RRR, good S1/S2, no murmur Resp: CTABL, no wheezes, non-labored Abd: soft, tender to palpation with guarding of LU and LLQ, + BS.  Ext: No edema, warm Neuro: Alert and oriented, No gross deficits  Assessment and plan:  # abdominal pain, left upper quadrant and left lower quadrant Continued and worsening since last visit On his abdominal exam he does havetenderness localized around the left upper and left lower quadrant with some voluntary guarding No fever, diarrhea, malaise to indicate infectious source Given severity and continuance of pain will go ahead and proceed with a CT abdomen, follow-up one week We'll call with results     Orders Placed This Encounter  Procedures  . CT Abdomen Pelvis W Contrast    Standing Status: Future     Number of Occurrences:      Standing Expiration Date: 05/31/2016    Order Specific Question:  Reason for Exam (SYMPTOM  OR DIAGNOSIS REQUIRED)    Answer:  LUQ and LLQ pain    Order Specific Question:   Preferred imaging location?    Answer:  [facility]  . POCT urinalysis dipstick  . POCT UA - Microscopic Only     Laroy Apple, MD [facility] 03/01/2015, 1:50 PM

## 2015-03-05 ENCOUNTER — Ambulatory Visit (HOSPITAL_COMMUNITY)
Admission: RE | Admit: 2015-03-05 | Discharge: 2015-03-05 | Disposition: A | Discharge status: Home / Self Care | Payer: Medicare Other | Source: Ambulatory Visit | Attending: Family Medicine | Admitting: Family Medicine

## 2015-03-05 DIAGNOSIS — R160 Hepatomegaly, not elsewhere classified: Secondary | ICD-10-CM | POA: Diagnosis not present

## 2015-03-05 DIAGNOSIS — K760 Fatty (change of) liver, not elsewhere classified: Secondary | ICD-10-CM | POA: Diagnosis not present

## 2015-03-05 DIAGNOSIS — R1012 Left upper quadrant pain: Secondary | ICD-10-CM | POA: Diagnosis not present

## 2015-03-05 DIAGNOSIS — R350 Frequency of micturition: Secondary | ICD-10-CM | POA: Insufficient documentation

## 2015-03-08 ENCOUNTER — Ambulatory Visit (INDEPENDENT_AMBULATORY_CARE_PROVIDER_SITE_OTHER): Payer: Medicare Other | Admitting: Family Medicine

## 2015-03-08 ENCOUNTER — Encounter: Payer: Self-pay | Admitting: Family Medicine

## 2015-03-08 VITALS — BP 129/83 | HR 49 | Temp 98.0°F | Ht 68.0 in | Wt 203.4 lb

## 2015-03-08 DIAGNOSIS — R1012 Left upper quadrant pain: Secondary | ICD-10-CM

## 2015-03-08 DIAGNOSIS — N62 Hypertrophy of breast: Secondary | ICD-10-CM

## 2015-03-08 NOTE — Progress Notes (Signed)
   HPI  Patient presents today for follow-up of left upper quadrant pain  Patient explains that he's been having pain for 6-8 weeks, has not changed or worsened since our last visit.  He denies fevers, nausea vomiting, worsening with eating, or diarrhea.  He does have significant hypertriglyceridemia and has started taking his Lipitor and fenofibrate.   We changed him from Prilosec to dexilant, he has not made this change yet  PMH: Smoking status noted ROS: Per HPI  Objective: BP 129/83 mmHg  Pulse 49  Temp(Src) 98 F (36.7 C) (Oral)  Ht 5\' 8"  (1.727 m)  Wt 203 lb 6.4 oz (92.262 kg)  BMI 30.93 kg/m2 Gen: NAD, alert, cooperative with exam HEENT: NCAT, EOMI, PERRL CV: RRR, good S1/S2, no murmur Resp: CTABL, no wheezes, non-labored Abd: SNTND, BS present, no guarding or organomegaly Ext: No edema, warm Neuro: Alert and oriented, No gross deficits  Assessment and plan:  # LUQ/epigastric pain Continued, stable No etiology definefd with CT and [place] LAbs ok Check lipase with continued pain and hypertriglycerides Refer GI  #gynecomastia Atypical anti-psychotic use, likely secondary to this Offered mammogram for evaluation, however patient declines Reassurance, watchful waiting    Orders Placed This Encounter  Procedures  . Lipase  . Ambulatory referral to Gastroenterology    Referral Priority:  Routine    Referral Type:  Consultation    Referral Reason:  Specialty Services Required    Number of Visits Requested:  [place], MD [facility] 03/08/2015, 11:25 AM

## 2015-03-08 NOTE — Patient Instructions (Addendum)
Great to see you again!  I have ordered one more lab, I will let you know the results within a week.    If your symptoms worsen, you develop fevers, or if you are unable to tolerate food or fluid by mouth please seek medical attention

## 2015-03-09 LAB — LIPASE: LIPASE: 20 U/L (ref 0–59)

## 2015-04-03 ENCOUNTER — Encounter: Payer: Self-pay | Admitting: Physician Assistant

## 2015-04-03 ENCOUNTER — Ambulatory Visit (INDEPENDENT_AMBULATORY_CARE_PROVIDER_SITE_OTHER): Payer: Medicare Other | Admitting: Physician Assistant

## 2015-04-03 VITALS — BP 112/58 | HR 62 | Temp 98.2°F | Ht 68.0 in | Wt 202.0 lb

## 2015-04-03 DIAGNOSIS — H6692 Otitis media, unspecified, left ear: Secondary | ICD-10-CM | POA: Diagnosis not present

## 2015-04-03 MED ORDER — AZITHROMYCIN 250 MG PO TABS: ORAL_TABLET | ORAL | Status: DC

## 2015-04-03 NOTE — Progress Notes (Signed)
   Subjective:    Patient ID: Preston Berry, male    DOB: 03-Nov-1985, 29 y.o.   MRN: [id number]  HPI  29 y/o male presents with left side ear pain. Is now having nausea and vomited once last night. Brother had similar symptoms. Has not tried any medications.    Review of Systems  Constitutional: Positive for diaphoresis. Negative for chills.  HENT: Positive for ear pain (left ).   Eyes: Negative.   Respiratory: Negative.   Cardiovascular: Negative.   Gastrointestinal: Positive for nausea.  Endocrine: Negative.   Genitourinary: Negative.   Musculoskeletal: Negative.   Skin: Negative.   Neurological: Negative.        Objective:   Physical Exam  Constitutional: He is oriented to person, place, and time. He appears well-developed and well-nourished.  HENT:  Head: Normocephalic.  Right Ear: External ear normal.  Nose: Nose normal.  Mouth/Throat: Oropharynx is clear and moist. No oropharyngeal exudate.  Left tm erythema   Abdominal: Soft. He exhibits no distension and no mass. There is no tenderness. There is no rebound and no guarding.  Neurological: He is alert and oriented to person, place, and time.  Psychiatric: He has a normal mood and affect. His behavior is normal. Judgment and thought content normal.  Nursing note and vitals reviewed.         Assessment & Plan:  1. Acute left otitis media, recurrence not specified, unspecified otitis media type - Patient states that he has took zpack before with no complications from combo with seroquel. Have advised him to stop medication if palpitations etc are felt.  - azithromycin (ZITHROMAX) 250 MG tablet; 2 tablets on day 1, 1 tablet on day 2-5  Dispense: 6 tablet; Refill: 0   RTO prn   Tiffany A. Benjamin Stain PA-C

## 2015-04-09 ENCOUNTER — Encounter: Payer: Self-pay | Admitting: Internal Medicine

## 2015-04-29 ENCOUNTER — Other Ambulatory Visit: Payer: Self-pay | Admitting: Family

## 2015-05-10 ENCOUNTER — Ambulatory Visit: Payer: Medicare Other | Admitting: Family Medicine

## 2015-05-11 ENCOUNTER — Ambulatory Visit: Payer: Medicare Other | Admitting: Family Medicine

## 2015-05-14 ENCOUNTER — Ambulatory Visit: Payer: Medicare Other | Admitting: Internal Medicine

## 2015-05-25 ENCOUNTER — Encounter: Payer: Self-pay | Admitting: Family Medicine

## 2015-05-25 ENCOUNTER — Ambulatory Visit (INDEPENDENT_AMBULATORY_CARE_PROVIDER_SITE_OTHER): Payer: Medicare Other | Admitting: Family Medicine

## 2015-05-25 VITALS — BP 124/85 | HR 89 | Temp 98.4°F | Ht 68.0 in | Wt 206.4 lb

## 2015-05-25 DIAGNOSIS — Z139 Encounter for screening, unspecified: Secondary | ICD-10-CM | POA: Diagnosis not present

## 2015-05-25 DIAGNOSIS — R369 Urethral discharge, unspecified: Secondary | ICD-10-CM

## 2015-05-25 DIAGNOSIS — Z114 Encounter for screening for human immunodeficiency virus [HIV]: Secondary | ICD-10-CM | POA: Diagnosis not present

## 2015-05-25 DIAGNOSIS — Z113 Encounter for screening for infections with a predominantly sexual mode of transmission: Secondary | ICD-10-CM | POA: Diagnosis not present

## 2015-05-25 NOTE — Patient Instructions (Signed)
Great to see you!  We will call with results, for now I would not be worried but do use condoms every time.

## 2015-05-25 NOTE — Progress Notes (Signed)
   HPI  Patient presents today ear with concern for STI.  Patient explains that over the last 2 weeks she's had clearitchy penile discharge.He also describes a foul odor. He states that his girlfriend was recently diagnosed with bacterial vaginosis and is concerned he has a STI. He's had one sexual partner in last 4 years He has had  gonorrhea once before that was treated and culture negative.  PMH: Smoking status noted ROS: Per HPI  Objective: BP 124/85 mmHg  Pulse 89  Temp(Src) 98.4 F (36.9 C) (Oral)  Ht 5\' 8"  (1.727 m)  Wt 206 lb 6.4 oz (93.622 kg)  BMI 31.39 kg/m2 Gen: NAD, alert, cooperative with exam HEENT: NCAT, oropharynx clear CV: RRR, good S1/S2, no murmur Resp: CTABL, no wheezes, non-labored Ext: No edema, warm Neuro: Alert and oriented, No gross deficits GU: No penile discharge or penile lesions.  Assessment and plan:  # Concern for sexually transmitted infection Labs drawn, HIV was not collected as the patient cannot afford it and his insurance would not cover it. Will check HIV if anything else is positive.  Exam reassuring    Orders Placed This Encounter  Procedures  . GC/Chlamydia Probe Amp  . [place], MD [facility] 05/25/2015, 3:49 PM

## 2015-05-26 LAB — RPR: RPR Ser Ql: NONREACTIVE

## 2015-05-29 LAB — GC/CHLAMYDIA PROBE AMP
CHLAMYDIA, DNA PROBE: NEGATIVE
Neisseria gonorrhoeae by PCR: NEGATIVE

## 2015-06-27 ENCOUNTER — Telehealth: Payer: Self-pay | Admitting: Family Medicine

## 2015-06-27 DIAGNOSIS — R1012 Left upper quadrant pain: Secondary | ICD-10-CM

## 2015-06-28 MED ORDER — DEXLANSOPRAZOLE 30 MG PO CPDR: 30.0000 mg | DELAYED_RELEASE_CAPSULE | Freq: Every day | ORAL | Status: DC

## 2015-06-28 NOTE — Telephone Encounter (Signed)
I have called and discussed his situation  He is moving into low-income housing and needs a doctors note to keep his dog.   He states he is disabled because of mood disorder and his dog is a companion dog that improves his mood control and helps his bipolar disorder as well as depression.   I explained that as long as it was not a disability paper I can likely fill it out, However we will decide when we look at it.   It is reasonable to support him having an animal if it minimizes pharmacologic treatment for mood disorder, i think that is a reasonable request.   Laroy Apple, MD [facility] 06/28/2015, 1:32 PM

## 2015-07-03 ENCOUNTER — Telehealth: Payer: Self-pay | Admitting: Family Medicine

## 2015-07-05 NOTE — Telephone Encounter (Signed)
Form was faxed, pt aware

## 2015-07-16 ENCOUNTER — Ambulatory Visit: Payer: Medicare Other | Admitting: Internal Medicine

## 2015-07-24 ENCOUNTER — Encounter: Payer: Self-pay | Admitting: *Deleted

## 2015-07-25 ENCOUNTER — Ambulatory Visit: Payer: Medicare Other | Admitting: Internal Medicine

## 2015-09-18 ENCOUNTER — Ambulatory Visit (INDEPENDENT_AMBULATORY_CARE_PROVIDER_SITE_OTHER): Payer: Medicare Other | Admitting: Family Medicine

## 2015-09-18 ENCOUNTER — Encounter: Payer: Self-pay | Admitting: Family Medicine

## 2015-09-18 VITALS — BP 102/68 | HR 65 | Temp 98.1°F | Ht 68.0 in | Wt 212.0 lb

## 2015-09-18 DIAGNOSIS — L309 Dermatitis, unspecified: Secondary | ICD-10-CM | POA: Diagnosis not present

## 2015-09-18 HISTORY — DX: Dermatitis, unspecified: L30.9

## 2015-09-18 MED ORDER — BETAMETHASONE SOD PHOS & ACET 6 (3-3) MG/ML IJ SUSP: 6.0000 mg | Freq: Once | INTRAMUSCULAR | Status: DC

## 2015-09-18 NOTE — Progress Notes (Signed)
Subjective:  Patient ID: Preston Berry, male    DOB: Oct 28, 1985  Age: 30 y.o. MRN: [id number]  CC: Rash   HPI Preston Berry presents for Recurrent rash widespread over his body but scant. That is Preston Berry has some outbreak at that Preston Berry'll live each hand. 1 bleb on the second right finger that opened and is healing. 3 open blisters on the abdomen and some itching on the buttocks bilaterally as well as the feet.   History Preston Berry has a past medical history of Arthritis; Hyperlipidemia; Depression; GERD (gastroesophageal reflux disease); Stroke [facility]); Hepatomegaly; Hepatic steatosis; and Gynecomastia.   Preston Berry has past surgical history that includes Other surgical history.   His family history includes Alcohol abuse in his father; Diabetes in his father; Diabetes Mellitus II in his mother; Stroke in his father.Preston Berry reports that Preston Berry has never smoked. Preston Berry does not have any smokeless tobacco history on file. Preston Berry reports that Preston Berry does not drink alcohol or use illicit drugs.    ROS Review of Systems  Constitutional: Negative for fever, activity change, appetite change and fatigue.  HENT: Negative for congestion and sore throat.   Eyes: Negative.   Respiratory: Negative.   Cardiovascular: Negative for chest pain.  Gastrointestinal: Negative.   Musculoskeletal: Negative.     Objective:  BP 102/68 mmHg  Pulse 65  Temp(Src) 98.1 F (36.7 C) (Oral)  Ht 5\' 8"  (1.727 m)  Wt 212 lb (96.163 kg)  BMI 32.24 kg/m2  BP Readings from Last 3 Encounters:  09/18/15 102/68  05/25/15 124/85  04/03/15 112/58    Wt Readings from Last 3 Encounters:  09/18/15 212 lb (96.163 kg)  05/25/15 206 lb 6.4 oz (93.622 kg)  04/03/15 202 lb (91.627 kg)     Physical Exam  Constitutional: Preston Berry appears well-developed and well-nourished.  HENT:  Head: Normocephalic and atraumatic.  Right Ear: External ear normal.  Left Ear: External ear normal.  Mouth/Throat: No oropharyngeal exudate or posterior oropharyngeal erythema.   Eyes: Pupils are equal, round, and reactive to light.  Neck: Normal range of motion. Neck supple.  Cardiovascular: Normal rate and regular rhythm.   No murmur heard. Pulmonary/Chest: Breath sounds normal. No respiratory distress.  Abdominal: Bowel sounds are normal.  Skin: Skin is warm and dry.  There is minimal papule at the right second finger PIP measuring 2 mm and appears to be a nearly healed blister/vesicle. There are 3 excoriated round blisters that have opened each measuring 3 mm at the central abdomen.. There is no rash on the buttocks or the feet.  Vitals reviewed.    Lab Results  Component Value Date   WBC 6.2 02/19/2015   HGB 15.8 08/20/2013   HCT 43.4 02/19/2015   PLT 352 02/19/2015   GLUCOSE 95 02/19/2015   CHOL 239* 03/29/2014   TRIG 559* 03/29/2014   HDL 28* 03/29/2014   Preston Berry Comment 03/29/2014   ALT 23 02/19/2015   AST 16 02/19/2015   NA 142 02/19/2015   K 3.7 02/19/2015   CL 99 02/19/2015   CREATININE 1.12 02/19/2015   BUN 16 02/19/2015   CO2 23 02/19/2015    US Abdomen Complete  03/05/2015  CLINICAL DATA:  Left upper quadrant pain. Tender to palpation in the left upper quadrant. Symptoms for 1 month. EXAM: ULTRASOUND ABDOMEN COMPLETE COMPARISON:  CT, 03/01/2015 FINDINGS: Gallbladder: No gallstones or wall thickening visualized. No sonographic Murphy sign noted. Common bile duct: Diameter: 4.1 mm Liver: Mildly enlarged. Diffuse increased parenchymal echogenicity with decreased  through transmission of the sound beam. No mass or focal lesion. Hepatopetal flow documented in the portal vein. IVC: No abnormality visualized. Pancreas: Visualized portion unremarkable. Spleen: Size and appearance within normal limits. Right Kidney: Length: 10.3 cm. Echogenicity within normal limits. No mass or hydronephrosis visualized. Left Kidney: Length: 11.8 cm. Echogenicity within normal limits. No mass or hydronephrosis visualized. Abdominal aorta: No aneurysm visualized. Other  findings: None. IMPRESSION: 1. No acute findings. No findings to explain left upper quadrant pain. 2. Mild hepatomegaly and extensive hepatic steatosis. Electronically Signed   By: Lajean Manes M.D.   On: 03/05/2015 09:31    Assessment & Plan:   [name] was seen today for rash.  Diagnoses and all orders for this visit:  Eczema -     betamethasone acetate-betamethasone sodium phosphate (CELESTONE) injection 6 mg; Inject 1 mL (6 mg total) into the muscle once.    Patient is very concerned about the possibility of scabies. Preston Berry is concerned that Preston Berry was not tested with a skin scraping on the last occasion and although the treatment worked it is back now. That treatment was 6 months ago. Preston Berry has no known exposures I have discontinued Mr. Fickett acetaminophen. I am also having him maintain his divalproex, QUEtiapine, Vitamin D3, atorvastatin, Fenofibric Acid, omeprazole, and Dexlansoprazole. We will continue to administer betamethasone acetate-betamethasone sodium phosphate.  Meds ordered this encounter  Medications  . betamethasone acetate-betamethasone sodium phosphate (CELESTONE) injection 6 mg    Sig:      Follow-up: Return if symptoms worsen or fail to improve.  Claretta Fraise, M.D.

## 2015-10-01 ENCOUNTER — Telehealth: Payer: Self-pay | Admitting: Family Medicine

## 2015-10-01 MED ORDER — LINDANE 1 % EX LOTN: 1.0000 "application " | TOPICAL_LOTION | Freq: Once | CUTANEOUS | Status: DC

## 2015-10-01 NOTE — Telephone Encounter (Signed)
Pt notified of Dr Stacks recommendation Verbalizes understanding 

## 2015-10-01 NOTE — Telephone Encounter (Signed)
Have pt. Apply Kwell lotion from the Jaw line to the toes (avoid face and scalp.) leave on for 8-12 hours overnight. Then wash off in the shower. Wash the bedclothes that he slept on & in invery hot water before sleeping on them again.I sent in tthe prescription

## 2015-10-16 ENCOUNTER — Ambulatory Visit (INDEPENDENT_AMBULATORY_CARE_PROVIDER_SITE_OTHER): Payer: Medicare Other | Admitting: Family Medicine

## 2015-10-16 ENCOUNTER — Encounter: Payer: Self-pay | Admitting: Family Medicine

## 2015-10-16 VITALS — BP 124/76 | HR 90 | Temp 98.1°F | Ht 68.0 in | Wt 210.4 lb

## 2015-10-16 DIAGNOSIS — N342 Other urethritis: Secondary | ICD-10-CM

## 2015-10-16 DIAGNOSIS — L309 Dermatitis, unspecified: Secondary | ICD-10-CM | POA: Diagnosis not present

## 2015-10-16 DIAGNOSIS — Z113 Encounter for screening for infections with a predominantly sexual mode of transmission: Secondary | ICD-10-CM | POA: Diagnosis not present

## 2015-10-16 MED ORDER — FLUOCINONIDE-E 0.05 % EX CREA: 1.0000 "application " | TOPICAL_CREAM | Freq: Two times a day (BID) | CUTANEOUS | Status: DC

## 2015-10-16 MED ORDER — METRONIDAZOLE 500 MG PO TABS: 500.0000 mg | ORAL_TABLET | Freq: Two times a day (BID) | ORAL | Status: DC

## 2015-10-16 MED ORDER — BETAMETHASONE SOD PHOS & ACET 6 (3-3) MG/ML IJ SUSP
6.0000 mg | Freq: Once | INTRAMUSCULAR | Status: AC
  Administered 2015-10-16: 6 mg via INTRAMUSCULAR

## 2015-10-16 NOTE — Progress Notes (Signed)
Subjective:  Patient ID: Preston Berry, male    DOB: [birth year]  Age: 30 y.o. MRN: [id number]  CC: penile discomfort   HPI Preston Berry presents for burning after ejaculation. 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 Has been wiping hersel back to front. Pt. Had gonorrhea in past. Concerned now for STD. Partner states she has not had other partners. Pt. States he has had none since treated after getting StD from his ex-wife.This was several ywars ago. He has been monogamous with current partner for 5 years.   Additionally pt. Treated for scabies a week ago. Still itching on hands, neck. A few lesions remain.   History Preston Berry has a past medical history of Arthritis; Hyperlipidemia; Depression; GERD (gastroesophageal reflux disease); Stroke [facility]); Hepatomegaly; Hepatic steatosis; and Gynecomastia.   He has past surgical history that includes Other surgical history.   His family history includes Alcohol abuse in his father; Diabetes in his father; Diabetes Mellitus II in his mother; Stroke in his father.He reports that he has never smoked. He does not have any smokeless tobacco history on file. He reports that he does not drink alcohol or use illicit drugs.    ROS Review of Systems  Constitutional: Negative for fever, chills and diaphoresis.  HENT: Negative for rhinorrhea and sore throat.   Respiratory: Negative for cough and shortness of breath.   Cardiovascular: Negative for chest pain.  Gastrointestinal: Negative for abdominal pain.  Genitourinary: Positive for dysuria, frequency and penile pain. Negative for urgency, hematuria, flank pain, discharge, penile swelling, scrotal swelling, genital sores and testicular pain.  Musculoskeletal: Negative for myalgias and arthralgias.  Skin: Negative for rash.    Objective:  BP 124/76 mmHg  Pulse 90  Temp(Src) 98.1 F (36.7 C) (Oral)  Ht 5\' 8"  (1.727 m)  Wt 210 lb 6.4 oz (95.437 kg)  BMI 32.00 kg/m2  SpO2 96%  BP Readings from Last 3  Encounters:  10/16/15 124/76  09/18/15 102/68  05/25/15 124/85    Wt Readings from Last 3 Encounters:  10/16/15 210 lb 6.4 oz (95.437 kg)  09/18/15 212 lb (96.163 kg)  05/25/15 206 lb 6.4 oz (93.622 kg)     Physical Exam  Constitutional: He is oriented to person, place, and time. He appears well-developed and well-nourished.  HENT:  Head: Normocephalic and atraumatic.  Right Ear: External ear normal.  Left Ear: External ear normal.  Mouth/Throat: No oropharyngeal exudate or posterior oropharyngeal erythema.  Eyes: Pupils are equal, round, and reactive to light.  Neck: Normal range of motion.  Cardiovascular: Normal rate and regular rhythm.   Pulmonary/Chest: Breath sounds normal.  Neurological: He is alert and oriented to person, place, and time.  Skin:  Few scattered papules on hands, neck   Vitals reviewed.    Lab Results  Component Value Date   WBC 6.2 02/19/2015   HGB 15.8 08/20/2013   HCT 43.4 02/19/2015   PLT 352 02/19/2015   GLUCOSE 95 02/19/2015   CHOL 239* 03/29/2014   TRIG 559* 03/29/2014   HDL 28* 03/29/2014   [place] Comment 03/29/2014   ALT 23 02/19/2015   AST 16 02/19/2015   NA 142 02/19/2015   K 3.7 02/19/2015   CL 99 02/19/2015   CREATININE 1.12 02/19/2015   BUN 16 02/19/2015   CO2 23 02/19/2015    US Abdomen Complete  03/05/2015  CLINICAL DATA:  Left upper quadrant pain. Tender to palpation in the left upper quadrant. Symptoms for 1 month. EXAM: ULTRASOUND ABDOMEN COMPLETE COMPARISON:  CT, 03/01/2015 FINDINGS: Gallbladder: No gallstones or wall thickening visualized. No sonographic Murphy sign noted. Common bile duct: Diameter: 4.1 mm Liver: Mildly enlarged. Diffuse increased parenchymal echogenicity with decreased through transmission of the sound beam. No mass or focal lesion. Hepatopetal flow documented in the portal vein. IVC: No abnormality visualized. Pancreas: Visualized portion unremarkable. Spleen: Size and appearance within normal  limits. Right Kidney: Length: 10.3 cm. Echogenicity within normal limits. No mass or hydronephrosis visualized. Left Kidney: Length: 11.8 cm. Echogenicity within normal limits. No mass or hydronephrosis visualized. Abdominal aorta: No aneurysm visualized. Other findings: None. IMPRESSION: 1. No acute findings. No findings to explain left upper quadrant pain. 2. Mild hepatomegaly and extensive hepatic steatosis. Electronically Signed   By: Lajean Manes M.D.   On: 03/05/2015 09:31    Assessment & Plan:   Preston Berry was seen today for penile discomfort.  Diagnoses and all orders for this visit:  Eczema -     betamethasone acetate-betamethasone sodium phosphate (CELESTONE) injection 6 mg; Inject 1 mL (6 mg total) into the muscle once.  Urethritis -     GC/Chlamydia Probe Amp -     STD Screen (8) -     HIV antibody (with reflex)  Other orders -     metroNIDAZOLE (FLAGYL) 500 MG tablet; Take 1 tablet (500 mg total) by mouth 2 (two) times daily. -     fluocinonide-emollient (LIDEX-E) 0.05 % cream; Apply 1 application topically 2 (two) times daily.  I have discontinued Preston Berry's lindane lotion. I am also having him start on metroNIDAZOLE and fluocinonide-emollient. Additionally, I am having him maintain his divalproex, QUEtiapine, Vitamin D3, atorvastatin, Fenofibric Acid, omeprazole, and Dexlansoprazole. We will continue to administer betamethasone acetate-betamethasone sodium phosphate and betamethasone acetate-betamethasone sodium phosphate.  Meds ordered this encounter  Medications  . metroNIDAZOLE (FLAGYL) 500 MG tablet    Sig: Take 1 tablet (500 mg total) by mouth 2 (two) times daily.    Dispense:  14 tablet    Refill:  0  . fluocinonide-emollient (LIDEX-E) 0.05 % cream    Sig: Apply 1 application topically 2 (two) times daily.    Dispense:  60 g    Refill:  5  . betamethasone acetate-betamethasone sodium phosphate (CELESTONE) injection 6 mg    Sig:      Follow-up: No Follow-up on  file.  Claretta Fraise, M.D.

## 2015-10-17 LAB — STD SCREEN (8)
HEP A IGM: NEGATIVE
HEP B S AG: NEGATIVE
HIV Screen 4th Generation wRfx: NONREACTIVE
HSV 1 Glycoprotein G Ab, IgG: 50 index — ABNORMAL HIGH (ref 0.00–0.90)
HSV 2 Glycoprotein G Ab, IgG: <0.91 index (ref 0.00–0.90)
Hep B C IgM: NEGATIVE
RPR: NONREACTIVE

## 2015-10-18 LAB — GC/CHLAMYDIA PROBE AMP
Chlamydia trachomatis, NAA: NEGATIVE
Neisseria gonorrhoeae by PCR: NEGATIVE

## 2015-10-23 ENCOUNTER — Telehealth: Payer: Self-pay | Admitting: Family Medicine

## 2015-10-23 NOTE — Telephone Encounter (Signed)
Patient called after hours for lab results.  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  He understands  Laroy Apple, MD [facility] 10/23/2015, 7:07 PM

## 2015-10-25 ENCOUNTER — Telehealth: Payer: Self-pay | Admitting: Family Medicine

## 2015-10-25 NOTE — Telephone Encounter (Signed)
Patient aware that he will have to be seen and appointment given for Monday with [facility].

## 2015-10-29 ENCOUNTER — Encounter (INDEPENDENT_AMBULATORY_CARE_PROVIDER_SITE_OTHER): Payer: Self-pay

## 2015-10-29 ENCOUNTER — Ambulatory Visit (INDEPENDENT_AMBULATORY_CARE_PROVIDER_SITE_OTHER): Payer: Medicare Other | Admitting: Family

## 2015-10-29 ENCOUNTER — Encounter: Payer: Self-pay | Admitting: Family

## 2015-10-29 VITALS — BP 121/84 | HR 78 | Temp 97.7°F | Ht 68.0 in | Wt 218.0 lb

## 2015-10-29 DIAGNOSIS — F3170 Bipolar disorder, currently in remission, most recent episode unspecified: Secondary | ICD-10-CM | POA: Diagnosis not present

## 2015-10-29 DIAGNOSIS — F32A Depression, unspecified: Secondary | ICD-10-CM

## 2015-10-29 DIAGNOSIS — F329 Major depressive disorder, single episode, unspecified: Secondary | ICD-10-CM

## 2015-10-29 NOTE — Progress Notes (Signed)
   Subjective:    Patient ID: Preston Berry, male    DOB: 1986-01-15, 30 y.o.   MRN: [id number]  HPI  Pt presents to the office today to have DMV paperwork filled out. Pt denies any headache, palpitations, SOB, or edema at this time.  Pt currently not taking any medications other than Flagy at this time. PT has a history of depression and bipolar and goes every 2 months to [facility] who manages this. Pt states he is doing well.     Review of Systems  Constitutional: Negative.   HENT: Negative.   Respiratory: Negative.   Cardiovascular: Negative.   Gastrointestinal: Negative.   Endocrine: Negative.   Genitourinary: Negative.   Musculoskeletal: Negative.   Neurological: Negative.   Hematological: Negative.   Psychiatric/Behavioral: Negative.   All other systems reviewed and are negative.      Objective:   Physical Exam  Constitutional: He is oriented to person, place, and time. He appears well-developed and well-nourished. No distress.  HENT:  Head: Normocephalic.  Right Ear: External ear normal.  Left Ear: External ear normal.  Mouth/Throat: Oropharynx is clear and moist.  Eyes: Pupils are equal, round, and reactive to light. Right eye exhibits no discharge. Left eye exhibits no discharge.  Neck: Normal range of motion. Neck supple. No thyromegaly present.  Cardiovascular: Normal rate, regular rhythm, normal heart sounds and intact distal pulses.   No murmur heard. Pulmonary/Chest: Effort normal and breath sounds normal. No respiratory distress. He has no wheezes.  Abdominal: Soft. Bowel sounds are normal. He exhibits no distension. There is no tenderness.  Musculoskeletal: Normal range of motion. He exhibits no edema or tenderness.  Neurological: He is alert and oriented to person, place, and time. He has normal reflexes. No cranial nerve deficit.  Skin: Skin is warm and dry. No rash noted. No erythema.  Psychiatric: He has a normal mood and affect. His behavior is  normal. Judgment and thought content normal.  Vitals reviewed.     BP 121/84 mmHg  Pulse 78  Temp(Src) 97.7 F (36.5 C) (Oral)  Ht 5\' 8"  (1.727 m)  Wt 218 lb (98.884 kg)  BMI 33.15 kg/m2     Assessment & Plan:  1. Bipolar disorder in partial remission, most recent episode unspecified type ([place])  2. Depression   DMV paper filled out for pt. Pt told he had to be cleared by ophthalmologist  RTO prn  Evelina Dun, FNP

## 2015-10-29 NOTE — Patient Instructions (Signed)
Bipolar Disorder Bipolar disorder is a mental illness. The term bipolar disorder actually is used to describe a group of disorders that all share varying degrees of emotional highs and lows that can interfere with daily functioning, such as work, school, or relationships. Bipolar disorder also can lead to drug abuse, hospitalization, and suicide. The emotional highs of bipolar disorder are periods of elation or irritability and high energy. These highs can range from a mild form (hypomania) to a severe form (mania). People experiencing episodes of hypomania may appear energetic, excitable, and highly productive. People experiencing mania may behave impulsively or erratically. They often make poor decisions. They may have difficulty sleeping. The most severe episodes of mania can involve having very distorted beliefs or perceptions about the world and seeing or hearing things that are not real (psychotic delusions and hallucinations).  The emotional lows of bipolar disorder (depression) also can range from mild to severe. Severe episodes of bipolar depression can involve psychotic delusions and hallucinations. Sometimes people with bipolar disorder experience a state of mixed mood. Symptoms of hypomania or mania and depression are both present during this mixed-mood episode. SIGNS AND SYMPTOMS There are signs and symptoms of the episodes of hypomania and mania as well as the episodes of depression. The signs and symptoms of hypomania and mania are similar but vary in severity. They include:  Inflated self-esteem or feeling of increased self-confidence.  Decreased need for sleep.  Unusual talkativeness (rapid or pressured speech) or the feeling of a need to keep talking.  Sensation of racing thoughts or constant talking, with quick shifts between topics that may or may not be related (flight of ideas).  Decreased ability to focus or concentrate.  Increased purposeful activity, such as work, studies,  or social activity, or nonproductive activity, such as pacing, squirming and fidgeting, or finger and toe tapping.  Impulsive behavior and use of poor judgment, resulting in high-risk activities, such as having unprotected sex or spending excessive amounts of money. Signs and symptoms of depression include the following:   Feelings of sadness, hopelessness, or helplessness.  Frequent or uncontrollable episodes of crying.  Lack of feeling anything or caring about anything.  Difficulty sleeping or sleeping too much.  Inability to enjoy the things you used to enjoy.   Desire to be alone all the time.   Feelings of guilt or worthlessness.  Lack of energy or motivation.   Difficulty concentrating, remembering, or making decisions.  Change in appetite or weight beyond normal fluctuations.  Thoughts of death or the desire to harm yourself. DIAGNOSIS  Bipolar disorder is diagnosed through an assessment by your caregiver. Your caregiver will ask questions about your emotional episodes. There are two main types of bipolar disorder. People with type I bipolar disorder have manic episodes with or without depressive episodes. People with type II bipolar disorder have hypomanic episodes and major depressive episodes, which are more serious than mild depression. The type of bipolar disorder you have can make an important difference in how your illness is monitored and treated. Your caregiver may ask questions about your medical history and use of alcohol or drugs, including prescription medication. Certain medical conditions and substances also can cause emotional highs and lows that resemble bipolar disorder (secondary bipolar disorder).  TREATMENT  Bipolar disorder is a long-term illness. It is best controlled with continuous treatment rather than treatment only when symptoms occur. The following treatments can be prescribed for bipolar disorders:  Medication--Medication can be prescribed by  a doctor that  is an expert in treating mental disorders (psychiatrists). Medications called mood stabilizers are usually prescribed to help control the illness. Other medications are sometimes added if symptoms of mania, depression, or psychotic delusions and hallucinations occur despite the use of a mood stabilizer. °· Talk therapy--Some forms of talk therapy are helpful in providing support, education, and guidance. °A combination of medication and talk therapy is best for managing the disorder over time. A procedure in which electricity is applied to your brain through your scalp (electroconvulsive therapy) is used in cases of severe mania when medication and talk therapy do not work or work too slowly. °  °This information is not intended to replace advice given to you by your health care provider. Make sure you discuss any questions you have with your health care provider. °  °Document Released: 09/29/2000 Document Revised: 07/14/2014 Document Reviewed: 07/19/2012 °Elsevier Interactive Patient Education ©2016 [employer] ° °

## 2015-11-06 ENCOUNTER — Encounter: Payer: Self-pay | Admitting: Gastroenterology

## 2016-01-01 ENCOUNTER — Other Ambulatory Visit: Payer: Self-pay

## 2016-01-01 ENCOUNTER — Encounter (HOSPITAL_COMMUNITY): Payer: Self-pay | Admitting: *Deleted

## 2016-01-01 ENCOUNTER — Observation Stay (HOSPITAL_COMMUNITY): Payer: Medicare Other

## 2016-01-01 ENCOUNTER — Observation Stay (HOSPITAL_BASED_OUTPATIENT_CLINIC_OR_DEPARTMENT_OTHER): Payer: Medicare Other

## 2016-01-01 ENCOUNTER — Observation Stay (HOSPITAL_COMMUNITY)
Admission: EM | Admit: 2016-01-01 | Discharge: 2016-01-01 | Disposition: A | Discharge status: Home / Self Care | Payer: Medicare Other | Attending: Emergency Medicine | Admitting: Emergency Medicine

## 2016-01-01 DIAGNOSIS — M79602 Pain in left arm: Secondary | ICD-10-CM | POA: Insufficient documentation

## 2016-01-01 DIAGNOSIS — Z880 Allergy status to penicillin: Secondary | ICD-10-CM | POA: Diagnosis not present

## 2016-01-01 DIAGNOSIS — R079 Chest pain, unspecified: Secondary | ICD-10-CM

## 2016-01-01 DIAGNOSIS — E785 Hyperlipidemia, unspecified: Secondary | ICD-10-CM

## 2016-01-01 DIAGNOSIS — I2109 ST elevation (STEMI) myocardial infarction involving other coronary artery of anterior wall: Secondary | ICD-10-CM | POA: Diagnosis not present

## 2016-01-01 DIAGNOSIS — K219 Gastro-esophageal reflux disease without esophagitis: Secondary | ICD-10-CM | POA: Insufficient documentation

## 2016-01-01 DIAGNOSIS — Z8249 Family history of ischemic heart disease and other diseases of the circulatory system: Secondary | ICD-10-CM | POA: Insufficient documentation

## 2016-01-01 DIAGNOSIS — R0789 Other chest pain: Principal | ICD-10-CM | POA: Insufficient documentation

## 2016-01-01 DIAGNOSIS — Z8673 Personal history of transient ischemic attack (TIA), and cerebral infarction without residual deficits: Secondary | ICD-10-CM | POA: Insufficient documentation

## 2016-01-01 DIAGNOSIS — R072 Precordial pain: Secondary | ICD-10-CM | POA: Diagnosis not present

## 2016-01-01 DIAGNOSIS — G4489 Other headache syndrome: Secondary | ICD-10-CM | POA: Diagnosis not present

## 2016-01-01 DIAGNOSIS — Z87891 Personal history of nicotine dependence: Secondary | ICD-10-CM | POA: Diagnosis not present

## 2016-01-01 DIAGNOSIS — F319 Bipolar disorder, unspecified: Secondary | ICD-10-CM | POA: Diagnosis not present

## 2016-01-01 LAB — CBC
HCT: 41.6 % (ref 39.0–52.0)
Hemoglobin: 13.9 g/dL (ref 13.0–17.0)
MCH: 28.9 pg (ref 26.0–34.0)
MCHC: 33.4 g/dL (ref 30.0–36.0)
MCV: 86.5 fL (ref 78.0–100.0)
PLATELETS: 299 10*3/uL (ref 150–400)
RBC: 4.81 MIL/uL (ref 4.22–5.81)
RDW: 12.4 % (ref 11.5–15.5)
WBC: 6.3 10*3/uL (ref 4.0–10.5)

## 2016-01-01 LAB — ECHOCARDIOGRAM COMPLETE
CHL CUP MV DEC (S): 187
CHL CUP STROKE VOLUME: 69 mL
EERAT: 6.47
EWDT: 187 ms
FS: 28 % (ref 28–44)
Height: 68 in
IV/PV OW: 1.2
LA diam index: 1.7 cm/m2
LASIZE: 37 mm
LAVOL: 51.3 mL
LAVOLA4C: 46.9 mL
LAVOLIN: 23.6 mL/m2
LDCA: 2.84 cm2
LEFT ATRIUM END SYS DIAM: 37 mm
LV E/e' medial: 6.47
LV E/e'average: 6.47
LV PW d: 10.6 mm — AB (ref 0.6–1.1)
LV TDI E'LATERAL: 13.5
LV e' LATERAL: 13.5 cm/s
LV sys vol index: 13 mL/m2
LV sys vol: 29 mL (ref 21–61)
LVDIAVOL: 97 mL (ref 62–150)
LVDIAVOLIN: 45 mL/m2
LVOTD: 19 mm
MV pk A vel: 50.9 m/s
MV pk E vel: 87.3 m/s
MVPG: 3 mmHg
Simpson's disk: 71
TAPSE: 17.1 mm
TDI e' medial: 10
Weight: 3376 oz

## 2016-01-01 LAB — TROPONIN I: Troponin I: <0.03 ng/mL (ref <0.03)

## 2016-01-01 LAB — COMPREHENSIVE METABOLIC PANEL
ALK PHOS: 63 U/L (ref 38–126)
ALT: 49 U/L (ref 17–63)
AST: 35 U/L (ref 15–41)
Albumin: 3.9 g/dL (ref 3.5–5.0)
Anion gap: 8 (ref 5–15)
BUN: 11 mg/dL (ref 6–20)
CALCIUM: 9.2 mg/dL (ref 8.9–10.3)
CHLORIDE: 105 mmol/L (ref 101–111)
CO2: 24 mmol/L (ref 22–32)
CREATININE: 0.9 mg/dL (ref 0.61–1.24)
GFR calc non Af Amer: >60 mL/min (ref >60)
Glucose, Bld: 102 mg/dL — ABNORMAL HIGH (ref 65–99)
Potassium: 3.5 mmol/L (ref 3.5–5.1)
SODIUM: 137 mmol/L (ref 135–145)
Total Bilirubin: 0.5 mg/dL (ref 0.3–1.2)
Total Protein: 6.8 g/dL (ref 6.5–8.1)

## 2016-01-01 LAB — I-STAT TROPONIN, ED: TROPONIN I, POC: 0 ng/mL (ref 0.00–0.08)

## 2016-01-01 LAB — APTT: APTT: 102 s — AB (ref 24–37)

## 2016-01-01 LAB — DIFFERENTIAL
BASOS ABS: 0 10*3/uL (ref 0.0–0.1)
BASOS PCT: 1 %
Eosinophils Absolute: 0.4 10*3/uL (ref 0.0–0.7)
Eosinophils Relative: 6 %
Lymphocytes Relative: 46 %
Lymphs Abs: 2.9 10*3/uL (ref 0.7–4.0)
MONO ABS: 0.6 10*3/uL (ref 0.1–1.0)
Monocytes Relative: 9 %
NEUTROS ABS: 2.4 10*3/uL (ref 1.7–7.7)
NEUTROS PCT: 38 %

## 2016-01-01 LAB — LIPID PANEL
CHOL/HDL RATIO: 7.1 ratio
Cholesterol: 214 mg/dL — ABNORMAL HIGH (ref 0–200)
HDL: 30 mg/dL — ABNORMAL LOW (ref >40)
LDL Cholesterol: 139 mg/dL — ABNORMAL HIGH (ref 0–99)
Triglycerides: 227 mg/dL — ABNORMAL HIGH (ref <150)
VLDL: 45 mg/dL — AB (ref 0–40)

## 2016-01-01 LAB — PROTIME-INR
INR: 1.17 (ref 0.00–1.49)
PROTHROMBIN TIME: 15 s (ref 11.6–15.2)

## 2016-01-01 LAB — D-DIMER, QUANTITATIVE (NOT AT ARMC): D DIMER QUANT: 0.28 ug{FEU}/mL (ref 0.00–0.50)

## 2016-01-01 MED ORDER — KETOROLAC TROMETHAMINE 30 MG/ML IJ SOLN
30.0000 mg | Freq: Once | INTRAMUSCULAR | Status: AC
  Administered 2016-01-01: 30 mg via INTRAVENOUS
  Filled 2016-01-01: qty 1

## 2016-01-01 NOTE — ED Provider Notes (Signed)
Dr Burt Knack saw the patient this AM.  Pt does not need to have an echocardiogram performed.   He is stable for discharge.  Dorie Rank, MD 01/01/16 [phone]

## 2016-01-01 NOTE — ED Provider Notes (Signed)
TIME SEEN: 5:45 AM  CHIEF COMPLAINT: Chest pain, code STEMI  HPI: Pt is a 30 y.o. male with history of hyperlipidemia, GERD, reported history of a stroke who presents to the emergency department with left sided chest pain that he describes as a "thumping" that he has had for over 24 hours. Pain waxes and wanes but has been constant. It is not exertional, pleuritic and does not change with position. He denies any associated shortness of breath, nausea, vomiting, diaphoresis or dizziness. States he noticed an area of bruising to the left anterior ankle and left arm and that's what prompted him to go to [facility] they were concerned about ST morphology changes and called a code STEMI. Upon arrival to the emergency department, patient seen immediately by Dr. Ellyn Hack with cardiology who is canceled the code STEMI. Patient did receive heparin, nitroglycerin, Nitropaste with [place].  No history of PE, DVT, exogenous estrogen use, fracture, surgery, trauma, hospitalization, prolonged travel. No lower extremity swelling or pain. No calf tenderness.  Patient denies any fevers, cough, sinus congestion, runny nose recently. Chest pain does not change with position.   ROS: See HPI Constitutional: no fever  Eyes: no drainage  ENT: no runny nose   Cardiovascular:   chest pain  Resp: no SOB  GI: no vomiting GU: no dysuria Integumentary: no rash  Allergy: no hives  Musculoskeletal: no leg swelling  Neurological: no slurred speech ROS otherwise negative  PAST MEDICAL HISTORY/PAST SURGICAL HISTORY:  Past Medical History  Diagnosis Date  . Arthritis   . Hyperlipidemia   . Depression   . GERD (gastroesophageal reflux disease)   . Stroke ([place])     L sided weakness X 6 months, now resolved  . Hepatomegaly   . Hepatic steatosis   . Gynecomastia     MEDICATIONS:  Prior to Admission medications   Medication Sig Start Date End Date Taking? Authorizing Provider  [place]  Fenofibrate (FENOFIBRIC ACID) 135 MG CPDR TAKE ONE CAPSULE BY MOUTH ONCE DAILY Patient not taking: Reported on 09/18/2015 04/30/15   Chipper Herb, MD  divalproex (DEPAKOTE) 500 MG DR tablet Take 500 mg by mouth at bedtime. Reported on 10/29/2015    Historical Provider, MD  metroNIDAZOLE (FLAGYL) 500 MG tablet Take 1 tablet (500 mg total) by mouth 2 (two) times daily. 10/16/15   Claretta Fraise, MD  QUEtiapine (SEROQUEL) 100 MG tablet Take 100 mg by mouth at bedtime. Reported on 10/29/2015    Historical Provider, MD    ALLERGIES:  Allergies  Allergen Reactions  . Penicillins Anaphylaxis    swelling    SOCIAL HISTORY:  Social History  Substance Use Topics  . Smoking status: Never Smoker   . Smokeless tobacco: Not on file  . Alcohol Use: No    FAMILY HISTORY: Family History  Problem Relation Age of Onset  . Diabetes Father   . Alcohol abuse Father   . Diabetes Mellitus II Mother   . Stroke Father     EXAM: BP 129/85 mmHg  Pulse 74  Temp(Src) 98.8 F (37.1 C) (Oral)  Resp 17  Ht 5\' 8"  (1.727 m)  Wt 211 lb (95.709 kg)  BMI 32.09 kg/m2  SpO2 98% CONSTITUTIONAL: Alert and oriented and responds appropriately to questions. Well-appearing; well-nourished HEAD: Normocephalic EYES: Conjunctivae clear, PERRL ENT: normal nose; no rhinorrhea; moist mucous membranes NECK: Supple, no meningismus, no LAD  CARD: RRR; S1 and S2 appreciated; no murmurs, no clicks, no rubs, no gallops RESP: Normal  chest excursion without splinting or tachypnea; breath sounds clear and equal bilaterally; no wheezes, no rhonchi, no rales, no hypoxia or respiratory distress, speaking full sentences ABD/GI: Normal bowel sounds; non-distended; soft, non-tender, no rebound, no guarding, no peritoneal signs BACK:  The back appears normal and is non-tender to palpation, there is no CVA tenderness EXT: Normal ROM in all joints; non-tender to palpation; no edema; normal capillary refill; no cyanosis, no calf tenderness  or swelling; 2+ DP and radial pulses bilaterally SKIN: Normal color for age and race; warm; no rash, small area of ecchymosis noted to the distal anterior shin just above the left ankle NEURO: Moves all extremities equally, sensation to light touch intact diffusely, cranial nerves II through XII intact PSYCH: The patient's mood and manner are appropriate. Grooming and personal hygiene are appropriate.  MEDICAL DECISION MAKING: Patient here with atypical chest pain. Called out as a code STEMI. EKG does not show any ST elevation. Code STEMI canceled. Troponin is 0.00. Per Dr. Ellyn Hack, recommends obtaining a stat echocardiogram in the emergency department. We'll also obtain a d-dimer. We'll give Toradol for pain and reassess.  Did discuss with Dr. Ellyn Hack that EKG could be representative of pericarditis however his clinical picture does not fit with pericarditis.  ED PROGRESS: 6:45 AM  We have received records from [facility]. Patient did have a negative troponin they are as well. He has now had 2 negative troponins. D-dimer here is negative. Chest x-ray is clear. He is awaiting echocardiogram.  Patient pain-free after Toradol. Bernerd Pho, cardiology PA at bedside. She will determine if patient needs echocardiogram stat in the emergency department and let us know. Anticipate discharge home and patient is comfortable with this plan.  Have discussed with him return precautions.   I reviewed all nursing notes, vitals, pertinent old records, EKGs, labs, imaging (as available).     Date: 01/01/2016 5:33 AM  Rate: 72  Rhythm: normal sinus rhythm  QRS Axis: normal  Intervals: normal  ST/T Wave abnormalities: normal  Conduction Disutrbances: none  Narrative Interpretation:       [place], DO 01/01/16 [phone]

## 2016-01-01 NOTE — ED Notes (Signed)
Card PA at BS ?

## 2016-01-01 NOTE — ED Notes (Signed)
Patient ambulated in the hallway... Patient seemed fine walking with no complaints of pain.Marland KitchenMarland KitchenMarland Kitchen
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
## 2016-01-01 NOTE — ED Notes (Addendum)
Pt arrives by [facility]. EMS from [place]. Pt initially picked up from home for CP and taken to [facility]. STEMI called and transferred here. Heparin 5000 units, ntg 1 sl and 1" ntg paste given PTA. Dr. Ellyn Hack (cards) and Dr. Leonides Schanz EDP at [facility] upon arrival. C/o CP 2/10, onset yesterday morning, has been going on all day, denies any other sx. NSL x2 in place. Alert, NAD, calm, interactive, no dyspnea noted. Mentions HA. See xray and paperwork from [facility], (on chart).

## 2016-01-01 NOTE — ED Notes (Signed)
Family updated on condition. Will wait in waiting room.

## 2016-01-01 NOTE — ED Notes (Signed)
EKG repeated and POC troponin resulted, STEMI cancelled.

## 2016-01-01 NOTE — ED Notes (Signed)
Echocardiogram at bedside.

## 2016-01-01 NOTE — Progress Notes (Signed)
  Echocardiogram 2D Echocardiogram has been performed.  Darlina Sicilian M 01/01/2016, 9:39 AM

## 2016-01-01 NOTE — Discharge Instructions (Signed)
Nonspecific Chest Pain  Chest pain can be caused by many different conditions. There is always a chance that your pain could be related to something serious, such as a heart attack or a blood clot in your lungs. Chest pain can also be caused by conditions that are not life-threatening. If you have chest pain, it is very important to follow up with your health care provider. CAUSES  Chest pain can be caused by:  Heartburn.  Pneumonia or bronchitis.  Anxiety or stress.  Inflammation around your heart (pericarditis) or lung (pleuritis or pleurisy).  A blood clot in your lung.  A collapsed lung (pneumothorax). It can develop suddenly on its own (spontaneous pneumothorax) or from trauma to the chest.  Shingles infection (varicella-zoster virus).  Heart attack.  Damage to the bones, muscles, and cartilage that make up your chest wall. This can include:  Bruised bones due to injury.  Strained muscles or cartilage due to frequent or repeated coughing or overwork.  Fracture to one or more ribs.  Sore cartilage due to inflammation (costochondritis). RISK FACTORS  Risk factors for chest pain may include:  Activities that increase your risk for trauma or injury to your chest.  Respiratory infections or conditions that cause frequent coughing.  Medical conditions or overeating that can cause heartburn.  Heart disease or family history of heart disease.  Conditions or health behaviors that increase your risk of developing a blood clot.  Having had chicken pox (varicella zoster). SIGNS AND SYMPTOMS Chest pain can feel like:  Burning or tingling on the surface of your chest or deep in your chest.  Crushing, pressure, aching, or squeezing pain.  Dull or sharp pain that is worse when you move, cough, or take a deep breath.  Pain that is also felt in your back, neck, shoulder, or arm, or pain that spreads to any of these areas. Your chest pain may come and go, or it may stay  constant. DIAGNOSIS Lab tests or other studies may be needed to find the cause of your pain. Your health care provider may have you take a test called an ambulatory ECG (electrocardiogram). An ECG records your heartbeat patterns at the time the test is performed. You may also have other tests, such as:  Transthoracic echocardiogram (TTE). During echocardiography, sound waves are used to create a picture of all of the heart structures and to look at how blood flows through your heart.  Transesophageal echocardiogram (TEE).This is a more advanced imaging test that obtains images from inside your body. It allows your health care provider to see your heart in finer detail.  Cardiac monitoring. This allows your health care provider to monitor your heart rate and rhythm in real time.  Holter monitor. This is a portable device that records your heartbeat and can help to diagnose abnormal heartbeats. It allows your health care provider to track your heart activity for several days, if needed.  Stress tests. These can be done through exercise or by taking medicine that makes your heart beat more quickly.  Blood tests.  Imaging tests. TREATMENT  Your treatment depends on what is causing your chest pain. Treatment may include:  Medicines. These may include:  Acid blockers for heartburn.  Anti-inflammatory medicine.  Pain medicine for inflammatory conditions.  Antibiotic medicine, if an infection is present.  Medicines to dissolve blood clots.  Medicines to treat coronary artery disease.  Supportive care for conditions that do not require medicines. This may include:  Resting.  Applying heat  or cold packs to injured areas.  Limiting activities until pain decreases. HOME CARE INSTRUCTIONS  If you were prescribed an antibiotic medicine, finish it all even if you start to feel better.  Avoid any activities that bring on chest pain.  Do not use any tobacco products, including  cigarettes, chewing tobacco, or electronic cigarettes. If you need help quitting, ask your health care provider.  Do not drink alcohol.  Take medicines only as directed by your health care provider.  Keep all follow-up visits as directed by your health care provider. This is important. This includes any further testing if your chest pain does not go away.  If heartburn is the cause for your chest pain, you may be told to keep your head raised (elevated) while sleeping. This reduces the chance that acid will go from your stomach into your esophagus.  Make lifestyle changes as directed by your health care provider. These may include:  Getting regular exercise. Ask your health care provider to suggest some activities that are safe for you.  Eating a heart-healthy diet. A registered dietitian can help you to learn healthy eating options.  Maintaining a healthy weight.  Managing diabetes, if necessary.  Reducing stress. SEEK MEDICAL CARE IF:  Your chest pain does not go away after treatment.  You have a rash with blisters on your chest.  You have a fever. SEEK IMMEDIATE MEDICAL CARE IF:   Your chest pain is worse.  You have an increasing cough, or you cough up blood.  You have severe abdominal pain.  You have severe weakness.  You faint.  You have chills.  You have sudden, unexplained chest discomfort.  You have sudden, unexplained discomfort in your arms, back, neck, or jaw.  You have shortness of breath at any time.  You suddenly start to sweat, or your skin gets clammy.  You feel nauseous or you vomit.  You suddenly feel light-headed or dizzy.  Your heart begins to beat quickly, or it feels like it is skipping beats. These symptoms may represent a serious problem that is an emergency. Do not wait to see if the symptoms will go away. Get medical help right away. Call your local emergency services (911 in the [place] Do not drive yourself to the hospital.   This  information is not intended to replace advice given to you by your health care provider. Make sure you discuss any questions you have with your health care provider.   Document Released: 04/02/2005 Document Revised: 07/14/2014 Document Reviewed: 01/27/2014 Elsevier Interactive Patient Education 2016 Elsevier Inc[employer]  Dyslipidemia Dyslipidemia is an imbalance of the lipids in your blood. Lipids are waxy, fat-like proteins that your body needs in small amounts. Dyslipidemia often involves the lipids cholesterol or triglycerides. Common forms of dyslipidemia are:  High levels of bad cholesterol (LDL cholesterol). LDL cholesterol is the type of cholesterol that causes heart disease.  Low levels of good cholesterol (HDL cholesterol). HDL cholesterol is the type of cholesterol that helps protect against heart disease.  High levels of triglycerides. Triglycerides are a fatty substance in the blood linked to a buildup of plaque on your arteries. RISK FACTORS  Increased age.  Having a family history of high cholesterol.  Certain medicines, including birth control pills, diuretics, beta-blockers, and some medicines for depression.  Smoking.  Eating a high-fat diet.  Being overweight.  Medical conditions such as diabetes, polycystic ovary syndrome, pregnancy, kidney disease, and hypothyroidism.  Lack of regular exercise. SIGNS AND SYMPTOMS There are  no signs or symptoms with dyslipidemia. DIAGNOSIS A simple blood test called a fasting blood test can be done to determine your level of:  Total cholesterol. This is the combined number of LDL cholesterol and HDL cholesterol. A healthy number is lower than 200.  LDL cholesterol. The goal number for LDL cholesterol is different for each person depending on risk factors. Ask your health care provider what your LDL cholesterol number should be.  HDL cholesterol. A healthy level of HDL cholesterol is 60 or higher. A number lower than 40 for men or  50 for women is a danger sign.  Triglycerides. A healthy triglyceride number is less than 150. TREATMENT Dyslipidemia is a treatable condition. Your health care provider will advise you on what type of treatment is best based on your age, your test results, and current guidelines. Treatment may include:  Dietary changes. A dietitian may help you create a diet that is based on your risk factors, conditions, and lifestyle.  Regular exercise. This can help lower your LDL cholesterol, raise your HDL cholesterol, and help with weight management. Check with your health care provider before beginning an exercise program. Most people should participate in 30 minutes of brisk exercise 5 days a week.  Quitting smoking.  Medicines to lower LDL cholesterol and triglycerides.  If you have high levels of triglycerides, your health care provider may:  Have you stop drinking alcohol.  Have you restrict your fat intake.  Have you eliminate refined sugars from your diet.  Treat you for other conditions, such as underactive thyroid gland (hypothyroidism) and high blood sugar (hyperglycemia). Your health care provider will monitor your lipid levels with regular blood tests. HOME CARE INSTRUCTIONS  Eat a healthy diet. Follow any diet instructions if they were given to you by your health care provider.  Maintain a healthy weight.  Exercise regularly based on the recommendations of your health care provider.  Do not use any tobacco products, including cigarettes, chewing tobacco, or electronic cigarettes.  Take medicines only as directed by your health care provider.  Keep all follow-up visits as directed by your health care provider. SEEK MEDICAL CARE IF: You are having possible side effects from your medicines.   This information is not intended to replace advice given to you by your health care provider. Make sure you discuss any questions you have with your health care provider.   Document  Released: 06/28/2013 Document Revised: 07/14/2014 Document Reviewed: 06/28/2013 Elsevier Interactive Patient Education [employer].

## 2016-01-02 DIAGNOSIS — K219 Gastro-esophageal reflux disease without esophagitis: Secondary | ICD-10-CM | POA: Diagnosis not present

## 2016-01-02 DIAGNOSIS — F17200 Nicotine dependence, unspecified, uncomplicated: Secondary | ICD-10-CM | POA: Diagnosis not present

## 2016-01-02 DIAGNOSIS — R079 Chest pain, unspecified: Secondary | ICD-10-CM | POA: Diagnosis not present

## 2016-01-02 DIAGNOSIS — F319 Bipolar disorder, unspecified: Secondary | ICD-10-CM | POA: Diagnosis not present

## 2016-01-03 ENCOUNTER — Telehealth: Payer: Self-pay | Admitting: Gastroenterology

## 2016-01-03 ENCOUNTER — Ambulatory Visit: Payer: Medicare Other | Admitting: Gastroenterology

## 2016-01-04 NOTE — Telephone Encounter (Signed)
Pt moved appt to 03-06-2016

## 2016-01-09 ENCOUNTER — Encounter: Payer: Self-pay | Admitting: Physician Assistant

## 2016-01-09 ENCOUNTER — Ambulatory Visit (INDEPENDENT_AMBULATORY_CARE_PROVIDER_SITE_OTHER): Payer: Medicare Other | Admitting: Physician Assistant

## 2016-01-09 VITALS — BP 110/70 | HR 59 | Ht 68.0 in | Wt 215.4 lb

## 2016-01-09 DIAGNOSIS — R1032 Left lower quadrant pain: Secondary | ICD-10-CM

## 2016-01-09 DIAGNOSIS — R0789 Other chest pain: Secondary | ICD-10-CM | POA: Diagnosis not present

## 2016-01-09 DIAGNOSIS — E785 Hyperlipidemia, unspecified: Secondary | ICD-10-CM
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
## 2016-01-09 NOTE — Patient Instructions (Signed)
Medication Instructions:  None  Labwork: None  Testing/Procedures: Your physician has requested that you have an exercise tolerance test in [place]. For further information please visit [email or web address]. Please also follow instruction sheet, as given.    Follow-Up: Your physician recommends that you schedule a follow-up appointment in: 3 months with Dr. Burt Knack.   Any Other Special Instructions Will Be Listed Below (If Applicable).     If you need a refill on your cardiac medications before your next appointment, please call your pharmacy.

## 2016-01-11 ENCOUNTER — Ambulatory Visit: Payer: Medicare Other | Admitting: Family Medicine

## 2016-01-14 ENCOUNTER — Ambulatory Visit (HOSPITAL_COMMUNITY)
Admission: RE | Admit: 2016-01-14 | Discharge: 2016-01-14 | Disposition: A | Discharge status: Home / Self Care | Payer: Medicare Other | Source: Ambulatory Visit | Attending: Physician Assistant | Admitting: Physician Assistant

## 2016-01-14 DIAGNOSIS — R0789 Other chest pain: Secondary | ICD-10-CM | POA: Diagnosis not present

## 2016-01-14 LAB — EXERCISE TOLERANCE TEST
CSEPED: 4 min
CSEPEDS: 12 s
CSEPEW: 7 METS
CSEPPHR: 127 {beats}/min
MPHR: 191 {beats}/min
Percent HR: 66 %
RPE: 13
Rest HR: 56 {beats}/min

## 2016-01-15 ENCOUNTER — Encounter: Payer: Self-pay | Admitting: Family Medicine

## 2016-01-15 ENCOUNTER — Ambulatory Visit (INDEPENDENT_AMBULATORY_CARE_PROVIDER_SITE_OTHER): Payer: Medicare Other | Admitting: Family Medicine

## 2016-01-15 VITALS — BP 128/85 | HR 66 | Temp 97.7°F | Ht 68.0 in | Wt 214.6 lb

## 2016-01-15 DIAGNOSIS — R0789 Other chest pain: Secondary | ICD-10-CM | POA: Diagnosis not present

## 2016-01-15 DIAGNOSIS — K2100 Gastro-esophageal reflux disease with esophagitis, without bleeding: Secondary | ICD-10-CM

## 2016-01-15 DIAGNOSIS — F3170 Bipolar disorder, currently in remission, most recent episode unspecified: Secondary | ICD-10-CM

## 2016-01-15 MED ORDER — PANTOPRAZOLE SODIUM 40 MG PO TBEC: 40.0000 mg | DELAYED_RELEASE_TABLET | Freq: Every day | ORAL | Status: DC

## 2016-01-15 NOTE — Progress Notes (Signed)
   HPI  Patient presents today here for follow-up chest pain.  Patient was seen on 01/01/2016 at [facility] and transferred to [facility] with code STEMI, code STEMI was canceled upon arrival. He was ruled out and has had cardiology follow-up since that time. He's had an echocardiogram, troponins, and a stress test which were all normal.  He states that he's having left-sided chest and left arm stinging type pain with exertion or stress. He states that he has shortness of breath, racing heart, and sweating with the pain and has felt short of breath since that original episode happened on 6/27.  We discussed his symptoms at length. He's had increased GERD symptoms lately and is using Tums again.  About one month ago he stopped taking Seroquel and Depakote which were used to treat his bipolar disorder. He states that he's had regular outbursts of anger recently and had a very stressful time at home with his fiance's parents moving and for a short time.  He denies any chest pain today.  PMH: Smoking status noted ROS: Per HPI  Objective: BP 128/85 mmHg  Pulse 66  Temp(Src) 97.7 F (36.5 C) (Oral)  Ht 5\' 8"  (1.727 m)  Wt 214 lb 9.6 oz (97.342 kg)  BMI 32.64 kg/m2 Gen: NAD, alert, cooperative with exam HEENT: NCAT Chest wall: Left-sided chest wall pain with palpation, does not reproduce pain but does cause severe pain. CV: RRR, good S1/S2, no murmur Resp: CTABL, no wheezes, non-labored Abd: Soft, nontender, no guarding, mild left lower quadrant pain to palpation. Ext: No edema, warm Neuro: Alert and oriented, No gross deficits  Assessment and plan:  # Atypical chest pain, GERD, bipolar disorder I think from a heart standpoint he is pretty well ruled out. His chest pain is likely multifactorial, he does have pain with palpation of his left-sided pectoral muscle, he's also recently stopped his bipolar medications and is having symptoms of worsening mood lability, he's also having  increased GERD symptoms. Restart Protonix Recommended ice scheduled for 3 or 4 days to his left chest Also recommended as soon as possible follow-up with his psychiatrist and I discussed at length with significant effect his mood can have on physical symptoms. Red flags for return provided, follow-up in 3-4 weeks. Also consider esophageal dysmotility, encouraged him to keep his GI follow-up   Note that he denies suicidal and homicidal ideation today.   Meds ordered this encounter  Medications  . pantoprazole (PROTONIX) 40 MG tablet    Sig: Take 1 tablet (40 mg total) by mouth daily.    Dispense:  30 tablet    Refill:  [place], MD [facility] 01/15/2016, 12:04 PM

## 2016-01-15 NOTE — Patient Instructions (Addendum)
Great to see you!  I think you have more than one thing affecting your chest at one time. Anxiety, mucle pain, possibly some stomach issues.   Try Ice 15 minutes 4 times a day for your chest  Start protonix back   Call your psychiatrist to be seen as soon as possible.   Come back in 3-4 weeks.   Nonspecific Chest Pain  Chest pain can be caused by many different conditions. There is always a chance that your pain could be related to something serious, such as a heart attack or a blood clot in your lungs. Chest pain can also be caused by conditions that are not life-threatening. If you have chest pain, it is very important to follow up with your health care provider. CAUSES  Chest pain can be caused by:  Heartburn.  Pneumonia or bronchitis.  Anxiety or stress.  Inflammation around your heart (pericarditis) or lung (pleuritis or pleurisy).  A blood clot in your lung.  A collapsed lung (pneumothorax). It can develop suddenly on its own (spontaneous pneumothorax) or from trauma to the chest.  Shingles infection (varicella-zoster virus).  Heart attack.  Damage to the bones, muscles, and cartilage that make up your chest wall. This can include:  Bruised bones due to injury.  Strained muscles or cartilage due to frequent or repeated coughing or overwork.  Fracture to one or more ribs.  Sore cartilage due to inflammation (costochondritis). RISK FACTORS  Risk factors for chest pain may include:  Activities that increase your risk for trauma or injury to your chest.  Respiratory infections or conditions that cause frequent coughing.  Medical conditions or overeating that can cause heartburn.  Heart disease or family history of heart disease.  Conditions or health behaviors that increase your risk of developing a blood clot.  Having had chicken pox (varicella zoster). SIGNS AND SYMPTOMS Chest pain can feel like:  Burning or tingling on the surface of your chest or deep  in your chest.  Crushing, pressure, aching, or squeezing pain.  Dull or sharp pain that is worse when you move, cough, or take a deep breath.  Pain that is also felt in your back, neck, shoulder, or arm, or pain that spreads to any of these areas. Your chest pain may come and go, or it may stay constant. DIAGNOSIS Lab tests or other studies may be needed to find the cause of your pain. Your health care provider may have you take a test called an ambulatory ECG (electrocardiogram). An ECG records your heartbeat patterns at the time the test is performed. You may also have other tests, such as:  Transthoracic echocardiogram (TTE). During echocardiography, sound waves are used to create a picture of all of the heart structures and to look at how blood flows through your heart.  Transesophageal echocardiogram (TEE).This is a more advanced imaging test that obtains images from inside your body. It allows your health care provider to see your heart in finer detail.  Cardiac monitoring. This allows your health care provider to monitor your heart rate and rhythm in real time.  Holter monitor. This is a portable device that records your heartbeat and can help to diagnose abnormal heartbeats. It allows your health care provider to track your heart activity for several days, if needed.  Stress tests. These can be done through exercise or by taking medicine that makes your heart beat more quickly.  Blood tests.  Imaging tests. TREATMENT  Your treatment depends on what is causing  your chest pain. Treatment may include:  Medicines. These may include:  Acid blockers for heartburn.  Anti-inflammatory medicine.  Pain medicine for inflammatory conditions.  Antibiotic medicine, if an infection is present.  Medicines to dissolve blood clots.  Medicines to treat coronary artery disease.  Supportive care for conditions that do not require medicines. This may include:  Resting.  Applying heat  or cold packs to injured areas.  Limiting activities until pain decreases. HOME CARE INSTRUCTIONS  If you were prescribed an antibiotic medicine, finish it all even if you start to feel better.  Avoid any activities that bring on chest pain.  Do not use any tobacco products, including cigarettes, chewing tobacco, or electronic cigarettes. If you need help quitting, ask your health care provider.  Do not drink alcohol.  Take medicines only as directed by your health care provider.  Keep all follow-up visits as directed by your health care provider. This is important. This includes any further testing if your chest pain does not go away.  If heartburn is the cause for your chest pain, you may be told to keep your head raised (elevated) while sleeping. This reduces the chance that acid will go from your stomach into your esophagus.  Make lifestyle changes as directed by your health care provider. These may include:  Getting regular exercise. Ask your health care provider to suggest some activities that are safe for you.  Eating a heart-healthy diet. A registered dietitian can help you to learn healthy eating options.  Maintaining a healthy weight.  Managing diabetes, if necessary.  Reducing stress. SEEK MEDICAL CARE IF:  Your chest pain does not go away after treatment.  You have a rash with blisters on your chest.  You have a fever. SEEK IMMEDIATE MEDICAL CARE IF:   Your chest pain is worse.  You have an increasing cough, or you cough up blood.  You have severe abdominal pain.  You have severe weakness.  You faint.  You have chills.  You have sudden, unexplained chest discomfort.  You have sudden, unexplained discomfort in your arms, back, neck, or jaw.  You have shortness of breath at any time.  You suddenly start to sweat, or your skin gets clammy.  You feel nauseous or you vomit.  You suddenly feel light-headed or dizzy.  Your heart begins to beat  quickly, or it feels like it is skipping beats. These symptoms may represent a serious problem that is an emergency. Do not wait to see if the symptoms will go away. Get medical help right away. Call your local emergency services (911 in the [place] Do not drive yourself to the hospital.   This information is not intended to replace advice given to you by your health care provider. Make sure you discuss any questions you have with your health care provider.   Document Released: 04/02/2005 Document Revised: 07/14/2014 Document Reviewed: 01/27/2014 Elsevier Interactive Patient Education [employer].

## 2016-02-19 ENCOUNTER — Telehealth: Payer: Self-pay | Admitting: Family Medicine

## 2016-02-19 NOTE — Telephone Encounter (Signed)
Scheduled

## 2016-02-22 ENCOUNTER — Ambulatory Visit: Payer: Medicare Other | Admitting: Family Medicine

## 2016-02-29 ENCOUNTER — Ambulatory Visit: Payer: Medicare Other | Admitting: Family Medicine

## 2016-03-06 ENCOUNTER — Ambulatory Visit: Payer: Medicare Other | Admitting: Gastroenterology

## 2016-03-11 ENCOUNTER — Ambulatory Visit: Payer: Medicare Other | Admitting: Family Medicine

## 2016-03-12 ENCOUNTER — Encounter: Payer: Self-pay | Admitting: Family Medicine

## 2016-07-18 ENCOUNTER — Ambulatory Visit: Payer: Medicare Other | Admitting: Family Medicine

## 2016-07-25 ENCOUNTER — Ambulatory Visit: Payer: Medicare Other | Admitting: Family Medicine

## 2016-07-31 DIAGNOSIS — R1032 Left lower quadrant pain: Secondary | ICD-10-CM | POA: Diagnosis not present

## 2016-07-31 DIAGNOSIS — S39011A Strain of muscle, fascia and tendon of abdomen, initial encounter: Secondary | ICD-10-CM | POA: Diagnosis not present

## 2016-07-31 DIAGNOSIS — R109 Unspecified abdominal pain: Secondary | ICD-10-CM | POA: Diagnosis not present

## 2016-09-03 ENCOUNTER — Telehealth: Payer: Self-pay | Admitting: Family Medicine

## 2016-09-03 NOTE — Telephone Encounter (Signed)
appt made for tomorrow  - as he was unable to come in today

## 2016-09-04 ENCOUNTER — Encounter: Payer: Self-pay | Admitting: Family Medicine

## 2016-09-04 ENCOUNTER — Ambulatory Visit (INDEPENDENT_AMBULATORY_CARE_PROVIDER_SITE_OTHER): Payer: Medicare Other | Admitting: Family Medicine

## 2016-09-04 VITALS — BP 136/86 | HR 87 | Temp 98.4°F | Ht 68.0 in | Wt 213.0 lb

## 2016-09-04 DIAGNOSIS — R109 Unspecified abdominal pain: Secondary | ICD-10-CM

## 2016-09-04 DIAGNOSIS — R739 Hyperglycemia, unspecified: Secondary | ICD-10-CM | POA: Diagnosis not present

## 2016-09-04 MED ORDER — PANTOPRAZOLE SODIUM 40 MG PO TBEC
40.0000 mg | DELAYED_RELEASE_TABLET | Freq: Every day | ORAL | 3 refills | Status: DC
Start: 2016-09-04 — End: 2017-05-11

## 2016-09-04 MED ORDER — CYCLOBENZAPRINE HCL 10 MG PO TABS: 10.0000 mg | ORAL_TABLET | Freq: Three times a day (TID) | ORAL | 1 refills | Status: DC | PRN

## 2016-09-04 NOTE — Progress Notes (Signed)
   HPI  Patient presents today here for ER follow-up for left flank pain.  Patient explains that he went to [facility] about 2-3 weeks ago for left flank pain. He was evaluated with CT abdomen, blood work, and urine test which are not available.  He was told that he had a torn muscle in his left abdominal wall and given a medicine for "muscle pain".  Patient states that since leaving his pain is stable. He has some improvement with ibuprofen.  He denies fever, chills, sweats, difficulty tolerating food and fluids, or change in bowel habits.  Refill Protonix for GERD, this is very effective  PMH: Smoking status noted ROS: Per HPI  Objective: BP 136/86   Pulse 87   Temp 98.4 F (36.9 C) (Oral)   Ht '5\' 8"'$  (1.727 m)   Wt 213 lb (96.6 kg)   BMI 32.39 kg/m  Gen: NAD, alert, cooperative with exam HEENT: NCAT CV: RRR, good S1/S2, no murmur Resp: CTABL, no wheezes, non-labored Abd: Positive bowel sounds, tenderness to palpation of the left flank in the left upper quadrant on back to his left upper back just to need to 12th rib. Soft, no rebound Ext: No edema, warm Neuro: Alert and oriented, No gross deficits  Assessment and plan:  # Left flank pain Unclear etiology Patient with previous difficult to explain pains worked up extensively with advanced imaging. I will await ER report, this has been requested, before proceeding with any advanced imaging. Repeat labs Low threshold for follow up with worsening pain. Flexeril     Orders Placed This Encounter  Procedures  . CMP14+EGFR  . CBC with Differential/Platelet  . Lipase  . Urinalysis, Complete    Meds ordered this encounter  Medications  . pantoprazole (PROTONIX) 40 MG tablet    Sig: Take 1 tablet (40 mg total) by mouth daily.    Dispense:  30 tablet    Refill:  3  . cyclobenzaprine (FLEXERIL) 10 MG tablet    Sig: Take 1 tablet (10 mg total) by mouth 3 (three) times daily as needed for muscle spasms.   Dispense:  30 tablet    Refill:  [place], MD [facility] 09/04/2016, 2:34 PM

## 2016-09-04 NOTE — Patient Instructions (Signed)
Great to see you!  PLease let us know or seek medical help if you have any worsening pain.   For now we will repeat labs and see if we can identify any abnormalities  Try flexeril for pain, this may cause sedation so so not drive after you take it

## 2016-09-05 LAB — CMP14+EGFR
ALK PHOS: 80 IU/L (ref 39–117)
ALT: 49 IU/L — ABNORMAL HIGH (ref 0–44)
AST: 26 IU/L (ref 0–40)
Albumin/Globulin Ratio: 1.5 (ref 1.2–2.2)
Albumin: 4.6 g/dL (ref 3.5–5.5)
BUN / CREAT RATIO: 10 (ref 9–20)
BUN: 9 mg/dL (ref 6–20)
Bilirubin Total: 0.5 mg/dL (ref 0.0–1.2)
CALCIUM: 10.2 mg/dL (ref 8.7–10.2)
CO2: 22 mmol/L (ref 18–29)
CREATININE: 0.88 mg/dL (ref 0.76–1.27)
Chloride: 98 mmol/L (ref 96–106)
GFR calc Af Amer: 133 mL/min/{1.73_m2} (ref >59)
GFR, EST NON AFRICAN AMERICAN: 115 mL/min/{1.73_m2} (ref >59)
GLUCOSE: 129 mg/dL — AB (ref 65–99)
Globulin, Total: 3.1 g/dL (ref 1.5–4.5)
Potassium: 4.4 mmol/L (ref 3.5–5.2)
SODIUM: 140 mmol/L (ref 134–144)
Total Protein: 7.7 g/dL (ref 6.0–8.5)

## 2016-09-05 LAB — CBC WITH DIFFERENTIAL/PLATELET
BASOS: 1 %
Basophils Absolute: 0.1 10*3/uL (ref 0.0–0.2)
EOS (ABSOLUTE): 0.2 10*3/uL (ref 0.0–0.4)
EOS: 4 %
HEMATOCRIT: 44.7 % (ref 37.5–51.0)
HEMOGLOBIN: 15.8 g/dL (ref 13.0–17.7)
Immature Grans (Abs): 0 10*3/uL (ref 0.0–0.1)
Immature Granulocytes: 0 %
LYMPHS ABS: 2.3 10*3/uL (ref 0.7–3.1)
Lymphs: 38 %
MCH: 30.3 pg (ref 26.6–33.0)
MCHC: 35.3 g/dL (ref 31.5–35.7)
MCV: 86 fL (ref 79–97)
MONOCYTES: 8 %
MONOS ABS: 0.5 10*3/uL (ref 0.1–0.9)
NEUTROS PCT: 49 %
Neutrophils Absolute: 2.9 10*3/uL (ref 1.4–7.0)
Platelets: 305 10*3/uL (ref 150–379)
RBC: 5.21 x10E6/uL (ref 4.14–5.80)
RDW: 13.5 % (ref 12.3–15.4)
WBC: 5.9 10*3/uL (ref 3.4–10.8)

## 2016-09-05 LAB — LIPASE: Lipase: 21 U/L (ref 13–78)

## 2016-09-08 LAB — SPECIMEN STATUS REPORT

## 2016-09-08 LAB — HGB A1C W/O EAG: Hgb A1c MFr Bld: 5.3 % (ref 4.8–5.6)

## 2016-10-07 DIAGNOSIS — X58XXXA Exposure to other specified factors, initial encounter: Secondary | ICD-10-CM | POA: Diagnosis not present

## 2016-10-07 DIAGNOSIS — M5489 Other dorsalgia: Secondary | ICD-10-CM | POA: Diagnosis not present

## 2016-10-07 DIAGNOSIS — S335XXA Sprain of ligaments of lumbar spine, initial encounter: Secondary | ICD-10-CM | POA: Diagnosis not present

## 2016-10-08 DIAGNOSIS — X58XXXA Exposure to other specified factors, initial encounter: Secondary | ICD-10-CM | POA: Diagnosis not present

## 2016-10-08 DIAGNOSIS — M545 Low back pain: Secondary | ICD-10-CM | POA: Diagnosis not present

## 2016-10-08 DIAGNOSIS — Z87891 Personal history of nicotine dependence: Secondary | ICD-10-CM | POA: Diagnosis not present

## 2016-10-08 DIAGNOSIS — S3992XA Unspecified injury of lower back, initial encounter: Secondary | ICD-10-CM | POA: Diagnosis not present

## 2016-10-08 DIAGNOSIS — S335XXA Sprain of ligaments of lumbar spine, initial encounter: Secondary | ICD-10-CM | POA: Diagnosis not present

## 2016-11-10 ENCOUNTER — Telehealth: Payer: Self-pay | Admitting: Family Medicine

## 2016-11-10 NOTE — Telephone Encounter (Signed)
appt made

## 2016-11-11 ENCOUNTER — Ambulatory Visit (INDEPENDENT_AMBULATORY_CARE_PROVIDER_SITE_OTHER): Payer: Medicare Other | Admitting: Family Medicine

## 2016-11-11 ENCOUNTER — Encounter: Payer: Self-pay | Admitting: Family Medicine

## 2016-11-11 VITALS — BP 111/73 | HR 57 | Temp 97.2°F | Ht 68.0 in | Wt 208.0 lb

## 2016-11-11 DIAGNOSIS — B356 Tinea cruris: Secondary | ICD-10-CM

## 2016-11-11 DIAGNOSIS — N342 Other urethritis: Secondary | ICD-10-CM | POA: Diagnosis not present

## 2016-11-11 MED ORDER — TERBINAFINE HCL 1 % EX CREA: 1.0000 "application " | TOPICAL_CREAM | Freq: Two times a day (BID) | CUTANEOUS | 0 refills | Status: DC

## 2016-11-11 NOTE — Progress Notes (Signed)
   HPI  Patient presents today for 1 week of rash and stringy discharge from the penis. The rash is on the back side of the scrotum and perineum. It is very pruritic. He was noted to have had Trichomonas gonorrhea and chlamydia at the same time several years ago when he broke up with his ex. He says this feels about like that.Marland Kitchen He is having some burning as well.  PMH: Smoking status noted ROS: Per HPI  Objective: BP 111/73   Pulse (!) 57   Temp 97.2 F (36.2 C) (Oral)   Ht 5\' 8"  (1.727 m)   Wt 208 lb (94.3 kg)   BMI 31.63 kg/m  Gen: NAD, alert, cooperative with exam HEENT: NCAT, EOMI, PERRL CV: RRR, good S1/S2, no murmur Resp: CTABL, no wheezes, non-labored Abd: SNTND, BS present, no guarding or organomegaly Ext: No edema, warm Neuro: Alert and oriented, No gross deficits Skin: There is a erythematous lichenified eruption on the posterior aspect scrotum. No satellites are identified.  Genitourinary: Penis is free of lesion. No erythema at the meatus. Urethral swab obtained. Assessment and plan:  Urethritis - Plan: GC/Chlamydia Probe Amp  Tinea cruris - Plan: terbinafine (LAMISIL) 1 % cream    Orders Placed This Encounter  Procedures  . GC/Chlamydia Probe Amp    Meds ordered this encounter  Medications  . terbinafine (LAMISIL) 1 % cream    Sig: Apply 1 application topically 2 (two) times daily.    Dispense:  30 g    Refill:  0

## 2016-11-12 ENCOUNTER — Telehealth: Payer: Self-pay | Admitting: Family Medicine

## 2016-11-12 NOTE — Telephone Encounter (Signed)
Covering PCP, please advise.  

## 2016-11-13 ENCOUNTER — Other Ambulatory Visit: Payer: Self-pay | Admitting: Nurse Practitioner

## 2016-11-13 LAB — GC/CHLAMYDIA PROBE AMP
CHLAMYDIA, DNA PROBE: NEGATIVE
NEISSERIA GONORRHOEAE BY PCR: NEGATIVE

## 2016-11-13 MED ORDER — METRONIDAZOLE 500 MG PO TABS: ORAL_TABLET | ORAL | 0 refills | Status: DC

## 2016-11-13 NOTE — Telephone Encounter (Signed)
Flagyl rx sent to pharmacy

## 2016-11-13 NOTE — Telephone Encounter (Signed)
lmtcb

## 2016-11-13 NOTE — Telephone Encounter (Signed)
Patient aware.

## 2016-11-13 NOTE — Telephone Encounter (Signed)
Patient is calling again today. Was not addressed yesterday. Covering PCP today, please advise

## 2016-12-20 DIAGNOSIS — Z880 Allergy status to penicillin: Secondary | ICD-10-CM | POA: Diagnosis not present

## 2016-12-20 DIAGNOSIS — J019 Acute sinusitis, unspecified: Secondary | ICD-10-CM | POA: Diagnosis not present

## 2016-12-20 DIAGNOSIS — Z87891 Personal history of nicotine dependence: Secondary | ICD-10-CM | POA: Diagnosis not present

## 2016-12-20 DIAGNOSIS — R509 Fever, unspecified: Secondary | ICD-10-CM | POA: Diagnosis not present

## 2016-12-20 DIAGNOSIS — J029 Acute pharyngitis, unspecified: Secondary | ICD-10-CM | POA: Diagnosis not present

## 2017-03-20 DIAGNOSIS — R0789 Other chest pain: Secondary | ICD-10-CM | POA: Diagnosis not present

## 2017-03-20 DIAGNOSIS — Z87891 Personal history of nicotine dependence: Secondary | ICD-10-CM | POA: Diagnosis not present

## 2017-03-20 DIAGNOSIS — F319 Bipolar disorder, unspecified: Secondary | ICD-10-CM | POA: Diagnosis not present

## 2017-03-20 DIAGNOSIS — Z79899 Other long term (current) drug therapy: Secondary | ICD-10-CM | POA: Diagnosis not present

## 2017-03-20 DIAGNOSIS — K219 Gastro-esophageal reflux disease without esophagitis: Secondary | ICD-10-CM | POA: Diagnosis not present

## 2017-05-11 ENCOUNTER — Ambulatory Visit (INDEPENDENT_AMBULATORY_CARE_PROVIDER_SITE_OTHER): Payer: Medicare Other | Admitting: Family Medicine

## 2017-05-11 ENCOUNTER — Encounter: Payer: Self-pay | Admitting: Family Medicine

## 2017-05-11 VITALS — BP 129/83 | HR 75 | Temp 98.3°F | Ht 68.0 in | Wt 200.0 lb

## 2017-05-11 DIAGNOSIS — R1032 Left lower quadrant pain: Secondary | ICD-10-CM | POA: Diagnosis not present

## 2017-05-11 DIAGNOSIS — L0292 Furuncle, unspecified: Secondary | ICD-10-CM | POA: Diagnosis not present

## 2017-05-11 DIAGNOSIS — K2100 Gastro-esophageal reflux disease with esophagitis, without bleeding: Secondary | ICD-10-CM

## 2017-05-11 DIAGNOSIS — B356 Tinea cruris: Secondary | ICD-10-CM

## 2017-05-11 MED ORDER — NYSTATIN 100000 UNIT/GM EX POWD: Freq: Four times a day (QID) | CUTANEOUS | 1 refills | Status: DC

## 2017-05-11 MED ORDER — FLUCONAZOLE 150 MG PO TABS: ORAL_TABLET | ORAL | 0 refills | Status: DC

## 2017-05-11 MED ORDER — CYCLOBENZAPRINE HCL 10 MG PO TABS: 10.0000 mg | ORAL_TABLET | Freq: Three times a day (TID) | ORAL | 1 refills | Status: DC | PRN

## 2017-05-11 MED ORDER — DOXYCYCLINE HYCLATE 100 MG PO TABS: 100.0000 mg | ORAL_TABLET | Freq: Two times a day (BID) | ORAL | 0 refills | Status: DC

## 2017-05-11 MED ORDER — PANTOPRAZOLE SODIUM 40 MG PO TBEC: 40.0000 mg | DELAYED_RELEASE_TABLET | Freq: Every day | ORAL | 3 refills | Status: DC

## 2017-05-11 NOTE — Progress Notes (Signed)
   HPI  Patient presents today here for follow-up and acute complaints.  GERD Patient has daily symptoms without PPI, he can definitely tell when he misses a pill. Needs refill.  Tinea Patient tried Lamisil for his jock itch, he states that the smell worsened and he began to have worsening rash.  He would like to try something else.  He also describes a small knot in his right upper groin, this started maybe 2 or 3 months ago.  However it continues to be persistent.  He denies any severe pain. He states that he has shaved in that area and is concerned about ingrown hair.  Left lower quadrant pain This is been a chronic issue over the last 6-9 months. No worsening Patient describes left-sided sharp stabbing pain that occurs for a few moments without aggravating or alleviating factors. Patient states that it happens maybe 3 or 4 times a week he does go a few weeks at a time without pain. This is not aggravated by stooling, eating, or physical exercise. He has had a workup including CT scan and labs.  PMH: Smoking status noted ROS: Per HPI  Objective: BP 129/83   Pulse 75   Temp 98.3 F (36.8 C) (Oral)   Ht 5\' 8"  (1.727 m)   Wt 200 lb (90.7 kg)   BMI 30.41 kg/m  Gen: NAD, alert, cooperative with exam HEENT: NCAT CV: RRR, good S1/S2, no murmur Resp: CTABL, no wheezes, non-labored Ext: No edema, warm Neuro: Alert and oriented, No gross deficits Skin: Mild erythema of the bilateral crural folds Right pelvic area at 11:00 from the penis with a pea-sized nodule that is not tender to palpation, indurated, or showing any signs of drainage.  Assessment and plan:  #GERD Stable Refill PPI  #Left lower quadrant pain Chronic left lower quadrant pain, on exam he does have some tenderness to palpation, recommended GI referral which he is resistant to. He will consider  #Furuncle Right lower pelvic area nodule consistent with possible furuncle. Doxycycline Gust may be cyst and  need to be surgically removed to have complete resolution  # Tinea cruris Mild to moderate but persistent. Diflucan X 2  Nystatin powder Reassurance.    Meds ordered this encounter  Medications  . fluconazole (DIFLUCAN) 150 MG tablet    Sig: Take one pill and repeat in 1 week    Dispense:  2 tablet    Refill:  0  . doxycycline (VIBRA-TABS) 100 MG tablet    Sig: Take 1 tablet (100 mg total) 2 (two) times daily by mouth. 1 po bid    Dispense:  20 tablet    Refill:  0  . nystatin (MYCOSTATIN/NYSTOP) powder    Sig: Apply 4 (four) times daily topically.    Dispense:  60 g    Refill:  1  . cyclobenzaprine (FLEXERIL) 10 MG tablet    Sig: Take 1 tablet (10 mg total) 3 (three) times daily as needed by mouth for muscle spasms.    Dispense:  30 tablet    Refill:  1  . pantoprazole (PROTONIX) 40 MG tablet    Sig: Take 1 tablet (40 mg total) daily by mouth.    Dispense:  90 tablet    Refill:  [place], MD [facility] 05/11/2017, 4:10 PM

## 2017-05-11 NOTE — Patient Instructions (Addendum)
Great to see you!  Come back in 3-4 months for a physical exam  For the small knot- I have sent doxycyline  For jock itch- I have sent diflucan ( a pill), and a antifungal powder.   Please keep me posted if you pan worsens or changes.

## 2017-07-28 ENCOUNTER — Emergency Department (HOSPITAL_COMMUNITY)
Admission: EM | Admit: 2017-07-28 | Discharge: 2017-07-28 | Disposition: A | Discharge status: Home / Self Care | Payer: Medicare Other | Attending: Emergency Medicine | Admitting: Emergency Medicine

## 2017-07-28 ENCOUNTER — Encounter (HOSPITAL_COMMUNITY): Payer: Self-pay | Admitting: Emergency Medicine

## 2017-07-28 ENCOUNTER — Other Ambulatory Visit: Payer: Self-pay

## 2017-07-28 DIAGNOSIS — Z5321 Procedure and treatment not carried out due to patient leaving prior to being seen by health care provider: Secondary | ICD-10-CM | POA: Diagnosis not present

## 2017-07-28 DIAGNOSIS — R109 Unspecified abdominal pain: Secondary | ICD-10-CM | POA: Diagnosis not present

## 2017-07-28 LAB — URINALYSIS, ROUTINE W REFLEX MICROSCOPIC
Bilirubin Urine: NEGATIVE
Glucose, UA: NEGATIVE mg/dL
Hgb urine dipstick: NEGATIVE
Ketones, ur: 5 mg/dL — AB
LEUKOCYTES UA: NEGATIVE
NITRITE: NEGATIVE
Protein, ur: NEGATIVE mg/dL
SPECIFIC GRAVITY, URINE: 1.019 (ref 1.005–1.030)
pH: 5 (ref 5.0–8.0)

## 2017-07-28 LAB — BASIC METABOLIC PANEL
ANION GAP: 13 (ref 5–15)
BUN: 9 mg/dL (ref 6–20)
CHLORIDE: 98 mmol/L — AB (ref 101–111)
CO2: 29 mmol/L (ref 22–32)
Calcium: 10.1 mg/dL (ref 8.9–10.3)
Creatinine, Ser: 0.79 mg/dL (ref 0.61–1.24)
GFR calc non Af Amer: >60 mL/min (ref >60)
Glucose, Bld: 89 mg/dL (ref 65–99)
POTASSIUM: 4 mmol/L (ref 3.5–5.1)
SODIUM: 140 mmol/L (ref 135–145)

## 2017-07-28 LAB — CBC WITH DIFFERENTIAL/PLATELET
BASOS PCT: 1 %
Basophils Absolute: 0 10*3/uL (ref 0.0–0.1)
Eosinophils Absolute: 0.1 10*3/uL (ref 0.0–0.7)
Eosinophils Relative: 2 %
HEMATOCRIT: 46.9 % (ref 39.0–52.0)
HEMOGLOBIN: 15.6 g/dL (ref 13.0–17.0)
Lymphocytes Relative: 35 %
Lymphs Abs: 2.6 10*3/uL (ref 0.7–4.0)
MCH: 29.7 pg (ref 26.0–34.0)
MCHC: 33.3 g/dL (ref 30.0–36.0)
MCV: 89.3 fL (ref 78.0–100.0)
MONOS PCT: 9 %
Monocytes Absolute: 0.6 10*3/uL (ref 0.1–1.0)
NEUTROS ABS: 3.9 10*3/uL (ref 1.7–7.7)
NEUTROS PCT: 53 %
Platelets: 349 10*3/uL (ref 150–400)
RBC: 5.25 MIL/uL (ref 4.22–5.81)
RDW: 12.5 % (ref 11.5–15.5)
WBC: 7.3 10*3/uL (ref 4.0–10.5)

## 2017-07-28 LAB — LIPASE, BLOOD: LIPASE: 24 U/L (ref 11–51)

## 2017-07-28 NOTE — ED Triage Notes (Signed)
Patient reports dark urine, frequency and L sided flank to abdominal pain. Onset of symptoms a couple of months ago.

## 2017-07-28 NOTE — ED Notes (Signed)
Pt states he is concerned he has blood in his stool.  Expresses that his pain is all of the way across his abdomen and that his stools have been green to dark.  Hx of GI bleed per pt.  Added labs per protocol.  Updated on wait time.

## 2017-07-29 ENCOUNTER — Ambulatory Visit (INDEPENDENT_AMBULATORY_CARE_PROVIDER_SITE_OTHER): Payer: Medicare Other | Admitting: Pediatrics

## 2017-07-29 ENCOUNTER — Encounter: Payer: Self-pay | Admitting: Pediatrics

## 2017-07-29 VITALS — BP 134/72 | HR 93 | Temp 98.2°F | Ht 68.0 in | Wt 215.0 lb

## 2017-07-29 DIAGNOSIS — R1012 Left upper quadrant pain: Secondary | ICD-10-CM

## 2017-07-29 DIAGNOSIS — R300 Dysuria: Secondary | ICD-10-CM | POA: Diagnosis not present

## 2017-07-29 DIAGNOSIS — R195 Other fecal abnormalities: Secondary | ICD-10-CM | POA: Diagnosis not present

## 2017-07-29 DIAGNOSIS — R399 Unspecified symptoms and signs involving the genitourinary system: Secondary | ICD-10-CM | POA: Diagnosis not present

## 2017-07-29 LAB — URINALYSIS, COMPLETE
BILIRUBIN UA: NEGATIVE
GLUCOSE, UA: NEGATIVE
Ketones, UA: NEGATIVE
Nitrite, UA: NEGATIVE
PROTEIN UA: NEGATIVE
RBC UA: NEGATIVE
Specific Gravity, UA: 1.01 (ref 1.005–1.030)
UUROB: 0.2 mg/dL (ref 0.2–1.0)
pH, UA: 6.5 (ref 5.0–7.5)

## 2017-07-29 LAB — MICROSCOPIC EXAMINATION
Bacteria, UA: NONE SEEN
Epithelial Cells (non renal): NONE SEEN /hpf (ref 0–10)
RBC, UA: NONE SEEN /hpf (ref 0–?)
Renal Epithel, UA: NONE SEEN /hpf

## 2017-07-29 NOTE — Progress Notes (Signed)
Subjective:   Patient ID: Preston Berry, male    DOB: 1985-09-10, 32 y.o.   MRN: [id number] CC: Abdominal pain (left); burning with urination  HPI: [name] is a 32 y.o. male presenting for Abdominal pain (left); burning with urination  Starting about 4 weeks ago started having urinary frequency and urgency Drinking about five 20 oz bottles of water a day Says every 10-15 min he is getting up to go to the bathroom during the day Voids about 10 times at night  Here today with his girlfriend Occasionally will have some burning with urination over past few weeks, not usual, urinary frequency is what bothers him the most  No new sexual partners, has had STIs in past, says he has been tested since any new partners, declines testing today  Has had kidney stones in the past  Bothering him the most today is his L sided pain. This has been ongoing he says for over a year He relates some of L sided pain symptoms back more than a year ago when he was seen in ED after giving blood, had nausea, vomiting, was vomiting some blood at the time Pain doesn't radiate to his groin Sometimes has flank pain  Appetite has been ok Eating twice a day, lunch and dinner  Per chart review was in ED yesterday for blood in stools Asked pt, he says has had green to black-brown stools off and on  Hg yesterday baseline at 15.6, WBC of 7.3 He left before evaluation by MD due to wait time  Relevant past medical, surgical, family and social history reviewed. Allergies and medications reviewed and updated. Social History   Tobacco Use  Smoking Status Former Smoker  Smokeless Tobacco Never Used   ROS: Per HPI   Objective:    BP 134/72   Pulse 93   Temp 98.2 F (36.8 C) (Oral)   Ht 5\' 8"  (1.727 m)   Wt 215 lb (97.5 kg)   BMI 32.69 kg/m   Wt Readings from Last 3 Encounters:  07/29/17 215 lb (97.5 kg)  07/28/17 212 lb (96.2 kg)  05/11/17 200 lb (90.7 kg)    Gen: NAD, alert, cooperative with  exam, NCAT EYES: EOMI, no conjunctival injection, or no icterus ENT:   OP without erythema LYMPH: no cervical LAD CV: NRRR, normal S1/S2, no murmur, distal pulses 2+ b/l Resp: CTABL, no wheezes, normal WOB Abd: +BS, soft, some ttp L side, no peritoneal signs. no guarding or organomegaly Ext: No edema, warm Neuro: Alert and oriented, strength equal b/l UE and LE, coordination grossly normal MSK: normal muscle bulk  Assessment & Plan:  [name] was seen today for abdominal pain ongoing for a year though recently worsened, and intermittent burning with urination.  Diagnoses and all orders for this visit:  UTI symptoms -     Urinalysis, Complete  Left upper quadrant pain H/o stones Will order CT, pain worsening recently Has had CT a year ago for similar pain Pt to follow up with PCP -     CT RENAL STONE STUDY; Future  Dysuria UA positive only for small amount WBC, symptoms intermittent. Will wait for culture prior to treating Pt declined STI testing -     Urine Culture  Dark stools Describes as green/brown/dark No maroon colored or blood in stools Hg stable, check below -     Fecal occult blood, imunochemical; Future  Follow up plan: Return in about 1 week (around 08/05/2017)  Assunta Found, MD Tr[facility]  [place]

## 2017-07-30 ENCOUNTER — Encounter: Payer: Self-pay | Admitting: Pediatrics

## 2017-07-31 LAB — URINE CULTURE: Organism ID, Bacteria: NO GROWTH

## 2017-08-10 ENCOUNTER — Ambulatory Visit (INDEPENDENT_AMBULATORY_CARE_PROVIDER_SITE_OTHER): Payer: Medicare Other | Admitting: Family Medicine

## 2017-08-10 ENCOUNTER — Encounter: Payer: Self-pay | Admitting: Family Medicine

## 2017-08-10 VITALS — BP 131/87 | HR 85 | Temp 98.4°F | Ht 68.0 in | Wt 214.0 lb

## 2017-08-10 DIAGNOSIS — R1032 Left lower quadrant pain: Secondary | ICD-10-CM | POA: Diagnosis not present

## 2017-08-10 DIAGNOSIS — R3589 Other polyuria: Secondary | ICD-10-CM

## 2017-08-10 DIAGNOSIS — R195 Other fecal abnormalities: Secondary | ICD-10-CM

## 2017-08-10 DIAGNOSIS — R358 Other polyuria: Secondary | ICD-10-CM | POA: Diagnosis not present

## 2017-08-10 DIAGNOSIS — R7989 Other specified abnormal findings of blood chemistry: Secondary | ICD-10-CM | POA: Diagnosis not present

## 2017-08-10 LAB — BAYER DCA HB A1C WAIVED: HB A1C (BAYER DCA - WAIVED): 5 % (ref <7.0)

## 2017-08-10 NOTE — Patient Instructions (Signed)
Great to see you!  Come back to me in 1 month, we will work on a referral to GI, look for the stool test, and get a CT scan.

## 2017-08-10 NOTE — Progress Notes (Signed)
   HPI  Patient presents today here to discuss persistent abdominal pain.  Left lower quadrant abdominal pain is been going on for almost 1 year.  He was seen for left flank pain which is similar pain last March.  Patient states over the last 2 months he has had a worsening in the pain.  It is persistent dull left lower quadrant pain.  Is worse whenever he lies on it at night and in the morning. There are no aggravating or alleviating factors No association with stooling, eating, or urination.  Patient feels GERD is well controlled now.  Patient is hesitant to see GI due to concerns about getting a colonoscopy. He states that he has had black stools and has returned his FOBT card today.  So complains of urinating up to 27 times recently in 1 day.   PMH: Smoking status noted ROS: Per HPI  Objective: BP 131/87   Pulse 85   Temp 98.4 F (36.9 C) (Oral)   Ht 5\' 8"  (1.727 m)   Wt 214 lb (97.1 kg)   BMI 32.54 kg/m  Gen: NAD, alert, cooperative with exam HEENT: NCAT CV: RRR, good S1/S2, no murmur Resp: CTABL, no wheezes, non-labored Abd: Soft, positive bowel sounds, left lower quadrant and right lower quadrant tenderness to palpation, also left flank tenderness to palpation Ext: No edema, warm Neuro: Alert and oriented, No gross deficits  Assessment and plan:  #Left lower quadrant abdominal pain, dark stools Persistent abdominal pain now for almost 1 year. CT abdomen ordered, previous [facility] was reviewed and was unremarkable except for fatty liver. Refer to GI, appreciate their recommendations Labs recently drawn were reviewed and normal including lipase.  PT was actually FIT test, results pending  #Polyuria A1c today Recent UA was normal    Orders Placed This Encounter  Procedures  . CT Abdomen Pelvis W Contrast    Standing Status:   Future    Standing Expiration Date:   11/08/2018    Order Specific Question:   If indicated for the ordered procedure, I  authorize the administration of contrast media per Radiology protocol    Answer:   Yes    Order Specific Question:   Preferred imaging location?    Answer:   [facility]    Order Specific Question:   Radiology Contrast Protocol - do NOT remove file path    Answer:   [email or web address]  . Bayer DCA Hb A1c Waived  . Ambulatory referral to Gastroenterology    Referral Priority:   Routine    Referral Type:   Consultation    Referral Reason:   Specialty Services Required    Number of Visits Requested:   1    No orders of the defined types were placed in this encounter.   Laroy Apple, MD [facility] 08/10/2017, 3:39 PM

## 2017-08-12 LAB — FECAL OCCULT BLOOD, IMMUNOCHEMICAL: Fecal Occult Bld: POSITIVE — AB

## 2017-08-17 ENCOUNTER — Ambulatory Visit (INDEPENDENT_AMBULATORY_CARE_PROVIDER_SITE_OTHER): Payer: Medicare Other | Admitting: Internal Medicine

## 2017-08-25 ENCOUNTER — Ambulatory Visit (HOSPITAL_COMMUNITY)
Admission: RE | Admit: 2017-08-25 | Discharge: 2017-08-25 | Disposition: A | Discharge status: Home / Self Care | Payer: Medicare Other | Source: Ambulatory Visit | Attending: Family Medicine | Admitting: Family Medicine

## 2017-08-25 DIAGNOSIS — R1032 Left lower quadrant pain: Secondary | ICD-10-CM | POA: Diagnosis not present

## 2017-08-25 DIAGNOSIS — R109 Unspecified abdominal pain: Secondary | ICD-10-CM | POA: Diagnosis not present

## 2017-08-25 MED ORDER — IOPAMIDOL (ISOVUE-300) INJECTION 61%
100.0000 mL | Freq: Once | INTRAVENOUS | Status: AC | PRN
  Administered 2017-08-25: 100 mL via INTRAVENOUS

## 2017-09-02 ENCOUNTER — Ambulatory Visit (INDEPENDENT_AMBULATORY_CARE_PROVIDER_SITE_OTHER): Payer: Medicare Other | Admitting: Internal Medicine

## 2017-09-09 ENCOUNTER — Ambulatory Visit (INDEPENDENT_AMBULATORY_CARE_PROVIDER_SITE_OTHER): Payer: Medicare Other

## 2017-09-09 VITALS — BP 130/89 | HR 78 | Temp 98.0°F | Ht 68.0 in | Wt 216.0 lb

## 2017-09-09 DIAGNOSIS — Z0000 Encounter for general adult medical examination without abnormal findings: Secondary | ICD-10-CM

## 2017-09-09 NOTE — Progress Notes (Signed)
Subjective:   Preston Berry is a 32 y.o. male who presents for an Initial Medicare Annual Wellness Visit.  Review of Systems  Patient presents to office today with his fiance'. He and the fiance' live together in [place] in [place] call [place] Patient states that its a pretty rough neighborhood. Both patient and fiance' seem to have some cognitive impairment. Patient states that he was raised by his grandparents and doesn't seem to care for his biological parents. He states that he has never worked and that he has always been on medicare and medicaid since he was a child. He states that he quit school at the beginning of 11th grade and states that "school is not important because you come out with book sense but no common sense." Preston Berry' agrees stating that she also quit school and she is glad she did. He states at one time he wanted to  Patient is not on a regular exercise routine but does state that he has cut out a lot of his fatty food intake. He does not have an Advance Directive and information was not given at this time. Health maintenance is up to date. Chart states that patient is a former smoker but patient states he never picked up the habit. MMSE not performed because of cognitive defect. Patient states he is separated from his wife and has been for quite some time. He is trying to get a divorce. He also states he has a child that he never gets to see that is 7. He states that this child is from another women, not his wife, and he has been denied visitation from the child because he is unable to pay child support. He also states he is not sure what to do about the situation.    Cardiac Risk Factors include: dyslipidemia;male gender;hypertension;obesity (BMI >30kg/m2);sedentary lifestyle    Objective:    Today's Vitals   09/09/17 1451 09/09/17 1453  BP:  130/89  Pulse:  78  Temp:  98 F (36.7 C)  TempSrc:  Oral  Weight: 216 lb (98 kg) 216 lb (98 kg)  Height: 5\' 8"  (1.727  m) 5\' 8"  (1.727 m)  PainSc:  3    Body mass index is 32.84 kg/m.  Advanced Directives 09/09/2017 07/28/2017  Does Patient Have a Medical Advance Directive? No No  Would patient like information on creating a medical advance directive? No - Patient declined -    Current Medications (verified) Outpatient Encounter Medications as of 09/09/2017  Medication Sig  . aspirin 81 MG tablet Take 81 mg by mouth as needed for pain (chest pain).  . cyclobenzaprine (FLEXERIL) 10 MG tablet Take 1 tablet (10 mg total) 3 (three) times daily as needed by mouth for muscle spasms.  . pantoprazole (PROTONIX) 40 MG tablet Take 1 tablet (40 mg total) daily by mouth.   No facility-administered encounter medications on file as of 09/09/2017.     Allergies (verified) Penicillins and Keflex [cephalexin]   History: Past Medical History:  Diagnosis Date  . Arthritis   . Depression   . GERD (gastroesophageal reflux disease)   . Gynecomastia   . Hepatic steatosis   . Hepatomegaly   . Hyperlipidemia    Past Surgical History:  Procedure Laterality Date  . DEBRIDEMENT AND CLOSURE WOUND    . OTHER SURGICAL HISTORY     surgery to remove glass from peritoneal and buttox area d/t sled accident   Family History  Problem Relation Age of Onset  .  Diabetes Mellitus II Mother   . Diabetes Father   . Alcohol abuse Father   . Stroke Father   . Heart attack Father        In his 21's   Social History   Socioeconomic History  . Marital status: Legally Separated    Spouse name: None  . Number of children: 1  . Years of education: None  . Highest education level: 11th grade  Social Needs  . Financial resource strain: Not very hard  . Food insecurity - worry: None  . Food insecurity - inability: None  . Transportation needs - medical: None  . Transportation needs - non-medical: None  Occupational History  . Occupation: Disabled  Tobacco Use  . Smoking status: Former Research scientist (life sciences)  . Smokeless tobacco: Never Used    Substance and Sexual Activity  . Alcohol use: No    Alcohol/week: 0.0 oz    Frequency: Never  . Drug use: No  . Sexual activity: None  Other Topics Concern  . None  Social History Narrative  . None   Tobacco Counseling Chart states that patient if a former smoker but patient says he never took up the habit  Clinical Intake:     Pain : 0-10 Pain Score: 3  Pain Type: Acute pain      Patient states that he has been having left side abdominal pain for 1 year and has an appt with a GI at the end of this month. He has seen his PCP about this problem and was referred.            Activities of Daily Living In your present state of health, do you have any difficulty performing the following activities: 09/09/2017  Hearing? N  Vision? N  Difficulty concentrating or making decisions? N  Walking or climbing stairs? N  Dressing or bathing? N  Doing errands, shopping? N  Preparing Food and eating ? N  Using the Toilet? N  In the past six months, have you accidently leaked urine? N  Do you have problems with loss of bowel control? N  Managing your Medications? N  Managing your Finances? N  Housekeeping or managing your Housekeeping? N  Some recent data might be hidden     Immunizations and Health Maintenance Immunization History  Administered Date(s) Administered  . Tdap 10/15/2013   There are no preventive care reminders to display for this patient.  Patient Care Team: Timmothy Euler, MD as PCP - General (Family Medicine)  Indicate any recent Medical Services you may have received from other than [facility] providers in the past year (date may be approximate).    Assessment:   This is a routine wellness examination for Preston Berry.  Hearing/Vision screen Patient states he has no problem with hearing or vision  Dietary issues and exercise activities discussed: Current Exercise Habits: The patient does not participate in regular exercise at present, Exercise limited by:  None identified  Goals    . DIET - EAT MORE FRUITS AND VEGETABLES    . Exercise 150 min/wk Moderate Activity      Depression Screen PHQ 2/9 Scores 09/09/2017 08/10/2017 07/29/2017 05/11/2017  PHQ - 2 Score 0 0 0 0  PHQ- 9 Score - - - -  Exception Documentation - - - -    Fall Risk Fall Risk  09/09/2017 08/10/2017 07/29/2017 05/11/2017 01/15/2016  Falls in the past year? No No No No No    Is the patient's home free of loose throw  rugs in walkways, pet beds, electrical cords, etc?   yes      Grab bars in the bathroom? no      Handrails on the stairs?   yes      Adequate lighting?   yes    Cognitive Function: MMSE - Mini Mental State Exam 09/09/2017  Not completed: Unable to complete        Screening Tests Health Maintenance  Topic Date Due  . INFLUENZA VACCINE  10/04/2017 (Originally 02/04/2017)  . TETANUS/TDAP  10/16/2023  . HIV Screening  Completed      Cancer Screenings: Lung: Low Dose CT Chest recommended if Age 38-80 years, 30 pack-year currently smoking OR have quit w/in 15years. Patient does not qualify. Colorectal: Patient has an appt with a GI at the end of the month  Additional Screenings:  Hepatitis B/HIV/Syphillis: Hepatitis C Screening:  Patient has been screened for Hepatitis B but need additional labwork at next follow up with PCP      Plan:     I have personally reviewed and noted the following in the patient's chart:   . Medical and social history . Use of alcohol, tobacco or illicit drugs  . Current medications and supplements . Functional ability and status . Nutritional status . Physical activity . Advanced directives . List of other physicians . Hospitalizations, surgeries, and ER visits in previous 12 months . Vitals . Screenings to include cognitive, depression, and falls . Referrals and appointments  In addition, I have reviewed and discussed with patient certain preventive protocols, quality metrics, and best practice recommendations. A  written personalized care plan for preventive services as well as general preventive health recommendations were provided to patient. Follow up with PCP recommended in the near future. Advised to make sure he keeps appt with GI and to follow up as needed.      Rolena Infante, LPN   07/13/3843

## 2017-09-09 NOTE — Patient Instructions (Signed)
  Mr. Preston Berry , Thank you for taking time to come for your Medicare Wellness Visit. I appreciate your ongoing commitment to your health goals. Please review the following plan we discussed and let me know if I can assist you in the future.   These are the goals we discussed: Goals    . DIET - EAT MORE FRUITS AND VEGETABLES    . Exercise 150 min/wk Moderate Activity       This is a list of the screening recommended for you and due dates:  Health Maintenance  Topic Date Due  . Flu Shot  10/04/2017*  . Tetanus Vaccine  10/16/2023  . HIV Screening  Completed  *Topic was postponed. The date shown is not the original due date.

## 2017-09-28 ENCOUNTER — Encounter (INDEPENDENT_AMBULATORY_CARE_PROVIDER_SITE_OTHER): Payer: Self-pay | Admitting: Internal Medicine

## 2017-09-28 ENCOUNTER — Ambulatory Visit (INDEPENDENT_AMBULATORY_CARE_PROVIDER_SITE_OTHER): Payer: Medicare Other | Admitting: Internal Medicine

## 2017-09-28 VITALS — BP 100/80 | HR 60 | Temp 97.9°F | Ht 68.0 in | Wt 221.3 lb

## 2017-09-28 DIAGNOSIS — K625 Hemorrhage of anus and rectum: Secondary | ICD-10-CM

## 2017-09-28 DIAGNOSIS — R109 Unspecified abdominal pain: Secondary | ICD-10-CM

## 2017-09-28 NOTE — Patient Instructions (Signed)
3 stools cards home with patient.  

## 2017-09-28 NOTE — Progress Notes (Signed)
Subjective:    Patient ID: Preston Berry., male    DOB: Nov 17, 1985, 32 y.o.   MRN: [id number]  HPI Referred by Dr. Wendi Snipes for LLQ pain. Pain off and on for about a year.    For about the past year he has left flank pain off and on. Sometimes he cannot sleep on his left side. He also states he has seen blood in his stools. He sees blood in the commode at time. Occurs about 1-2 times a month. No family hx of colon cancer. He has a BM x 1 a day. States when he was about 14 he was in a sledding accident and had trauma to his rectum.   Had positive FOBT 08/10/2017  Hx of kidney stones.    08/25/2017 CT abdomen/pelvis with CM: rectal bleeding. IMPRESSION: 1. No explanation for the patient's pain is seen. No evidence of large or small bowel mucosal edema is seen and no diverticula are noted. 2. No renal or ureteral calculi are seen.   CBC    Component Value Date/Time   WBC 7.3 07/28/2017 1345   RBC 5.25 07/28/2017 1345   HGB 15.6 07/28/2017 1345   HGB 15.8 09/04/2016 1439   HCT 46.9 07/28/2017 1345   HCT 44.7 09/04/2016 1439   PLT 349 07/28/2017 1345   PLT 305 09/04/2016 1439   MCV 89.3 07/28/2017 1345   MCV 86 09/04/2016 1439   MCH 29.7 07/28/2017 1345   MCHC 33.3 07/28/2017 1345   RDW 12.5 07/28/2017 1345   RDW 13.5 09/04/2016 1439   LYMPHSABS 2.6 07/28/2017 1345   LYMPHSABS 2.3 09/04/2016 1439   MONOABS 0.6 07/28/2017 1345   EOSABS 0.1 07/28/2017 1345   EOSABS 0.2 09/04/2016 1439   BASOSABS 0.0 07/28/2017 1345   BASOSABS 0.1 09/04/2016 1439     Review of Systems Past Medical History:  Diagnosis Date  . Arthritis   . Depression   . GERD (gastroesophageal reflux disease)   . Gynecomastia   . Hepatic steatosis   . Hepatomegaly   . Hyperlipidemia     Past Surgical History:  Procedure Laterality Date  . DEBRIDEMENT AND CLOSURE WOUND    . OTHER SURGICAL HISTORY     surgery to remove glass from peritoneal and buttox area d/t sled accident    Allergies    Allergen Reactions  . Penicillins Anaphylaxis    swelling  . Keflex [Cephalexin]     Patient states he is allergic to cephlasporins. Unknown reaction    Current Outpatient Medications on File Prior to Visit  Medication Sig Dispense Refill  . aspirin 81 MG tablet Take 81 mg by mouth as needed for pain (chest pain).    . cyclobenzaprine (FLEXERIL) 10 MG tablet Take 1 tablet (10 mg total) 3 (three) times daily as needed by mouth for muscle spasms. 30 tablet 1  . pantoprazole (PROTONIX) 40 MG tablet Take 1 tablet (40 mg total) daily by mouth. 90 tablet 3   No current facility-administered medications on file prior to visit.         Objective:   Physical Exam Blood pressure 100/80, pulse 60, temperature 97.9 F (36.6 C), height 5\' 8"  (1.727 m), weight 221 lb 4.8 oz (100.4 kg).  Alert and oriented. Skin warm and dry. Oral mucosa is moist.   . Sclera anicteric, conjunctivae is pink. Thyroid not enlarged. No cervical lymphadenopathy. Lungs clear. Heart regular rate and rhythm.  Abdomen is soft. Bowel sounds are positive. No hepatomegaly. No abdominal  masses felt. No tenderness.  No edema to lower extremities. Stool brown and guaiac negative.         Assessment & Plan:  Left flank pain. ? Etiology. Has been treated for a UTI. Rectal bleeding. Stool brown and guaiac negative. Three stool cards home with patient.

## 2017-10-06 ENCOUNTER — Telehealth (INDEPENDENT_AMBULATORY_CARE_PROVIDER_SITE_OTHER): Payer: Self-pay | Admitting: *Deleted

## 2017-10-06 NOTE — Telephone Encounter (Signed)
   Diagnosis:    Result(s)   Card 1:: Negative:     Card 2:  Negative:         Card 3: Negative:  Stools were brown in color,appeared to have been runny.   Completed by: Nathanie Ottley,LPN   HEMOCCULT SENSA DEVELOPER: LOT#: [id number]  EXPIRATION DATE: 2021-11   HEMOCCULT SENSA CARD:  GHW#:[id number]   EXPIRATION DATE: 08/21   CARD CONTROL RESULTS:  POSITIVE: Positive  NEGATIVE: Negative    ADDITIONAL COMMENTS: Results were forwarded to Lelon Perla

## 2017-10-15 NOTE — Telephone Encounter (Signed)
Results given to patient.  Mitzie, OV in 3 months 

## 2017-10-19 NOTE — Telephone Encounter (Signed)
Patient scheduled for 01-18-18 at 2:15

## 2018-01-18 ENCOUNTER — Ambulatory Visit (INDEPENDENT_AMBULATORY_CARE_PROVIDER_SITE_OTHER): Payer: Medicare Other | Admitting: Internal Medicine

## 2018-01-19 ENCOUNTER — Encounter: Payer: Self-pay | Admitting: Family Medicine

## 2018-01-19 ENCOUNTER — Ambulatory Visit (INDEPENDENT_AMBULATORY_CARE_PROVIDER_SITE_OTHER): Payer: Medicare Other | Admitting: Family Medicine

## 2018-01-19 VITALS — BP 131/87 | HR 77 | Temp 98.3°F | Ht 68.0 in | Wt 213.2 lb

## 2018-01-19 DIAGNOSIS — B86 Scabies: Secondary | ICD-10-CM | POA: Diagnosis not present

## 2018-01-19 MED ORDER — PERMETHRIN 5 % EX CREA: 1.0000 "application " | TOPICAL_CREAM | Freq: Once | CUTANEOUS | 0 refills | Status: AC

## 2018-01-19 MED ORDER — PANTOPRAZOLE SODIUM 40 MG PO TBEC: 40.0000 mg | DELAYED_RELEASE_TABLET | Freq: Every day | ORAL | 3 refills | Status: DC

## 2018-01-19 NOTE — Progress Notes (Signed)
   HPI  Patient presents today with presumed scabies.  Patient explains that he has had itchy red rash that started on the palms for about 1 month ago. Patient states that he had this when he was younger and had characteristics similar symptoms. He felt that it may be poison oak at first which is why did not come right in.  No one else in the family has scabies that he knows of  PMH: Smoking status noted ROS: Per HPI  Objective: BP 131/87   Pulse 77   Temp 98.3 F (36.8 C) (Oral)   Ht 5\' 8"  (1.727 m)   Wt 213 lb 3.2 oz (96.7 kg)   BMI 32.42 kg/m  Gen: NAD, alert, cooperative with exam HEENT: NCAT CV: RRR, good S1/S2, no murmur Resp: CTABL, no wheezes, non-labored Ext: No edema, warm Neuro: Alert and oriented, No gross deficits Skin: Erythematous papules on bilateral palms, nothing in the finger webs, erythematous papules on the left arm and lower extremities as well  Assessment and plan:  #Scabies Most likely diagnosis Permethrin Return to clinic if not improving    Meds ordered this encounter  Medications  . pantoprazole (PROTONIX) 40 MG tablet    Sig: Take 1 tablet (40 mg total) by mouth daily.    Dispense:  90 tablet    Refill:  3  . permethrin (ELIMITE) 5 % cream    Sig: Apply 1 application topically once for 1 dose.    Dispense:  60 g    Refill:  [place], MD [facility] 01/19/2018, 2:02 PM

## 2018-01-19 NOTE — Patient Instructions (Signed)
Great to see you!  Come back to see Preston Berry for a physical in the fall.   Apply the cream evenly across the body from scalp to toes, leave on overnight at least 8 hours, wash off in the shower the following day

## 2018-02-02 ENCOUNTER — Ambulatory Visit (INDEPENDENT_AMBULATORY_CARE_PROVIDER_SITE_OTHER): Payer: Medicare Other | Admitting: Internal Medicine

## 2018-02-02 ENCOUNTER — Encounter (INDEPENDENT_AMBULATORY_CARE_PROVIDER_SITE_OTHER): Payer: Self-pay | Admitting: Internal Medicine

## 2018-07-03 DIAGNOSIS — E7800 Pure hypercholesterolemia, unspecified: Secondary | ICD-10-CM | POA: Diagnosis not present

## 2018-07-03 DIAGNOSIS — Z87891 Personal history of nicotine dependence: Secondary | ICD-10-CM | POA: Diagnosis not present

## 2018-07-03 DIAGNOSIS — F909 Attention-deficit hyperactivity disorder, unspecified type: Secondary | ICD-10-CM | POA: Diagnosis not present

## 2018-07-03 DIAGNOSIS — Z880 Allergy status to penicillin: Secondary | ICD-10-CM | POA: Diagnosis not present

## 2018-07-03 DIAGNOSIS — K219 Gastro-esophageal reflux disease without esophagitis: Secondary | ICD-10-CM | POA: Diagnosis not present

## 2018-07-03 DIAGNOSIS — J029 Acute pharyngitis, unspecified: Secondary | ICD-10-CM | POA: Diagnosis not present

## 2018-07-03 DIAGNOSIS — Z79899 Other long term (current) drug therapy: Secondary | ICD-10-CM | POA: Diagnosis not present

## 2018-07-03 DIAGNOSIS — F319 Bipolar disorder, unspecified: Secondary | ICD-10-CM | POA: Diagnosis not present

## 2018-08-11 DIAGNOSIS — J020 Streptococcal pharyngitis: Secondary | ICD-10-CM | POA: Diagnosis not present

## 2018-08-11 DIAGNOSIS — B9789 Other viral agents as the cause of diseases classified elsewhere: Secondary | ICD-10-CM | POA: Diagnosis not present

## 2018-08-11 DIAGNOSIS — K219 Gastro-esophageal reflux disease without esophagitis: Secondary | ICD-10-CM | POA: Diagnosis not present

## 2018-08-11 DIAGNOSIS — J069 Acute upper respiratory infection, unspecified: Secondary | ICD-10-CM | POA: Diagnosis not present

## 2018-08-11 DIAGNOSIS — Z79899 Other long term (current) drug therapy: Secondary | ICD-10-CM | POA: Diagnosis not present

## 2018-08-11 DIAGNOSIS — R05 Cough: Secondary | ICD-10-CM | POA: Diagnosis not present

## 2018-08-11 DIAGNOSIS — F319 Bipolar disorder, unspecified: Secondary | ICD-10-CM | POA: Diagnosis not present

## 2018-08-12 ENCOUNTER — Ambulatory Visit: Payer: Medicare Other | Admitting: Family Medicine

## 2018-08-26 ENCOUNTER — Encounter: Payer: Self-pay | Admitting: Family Medicine

## 2018-08-26 ENCOUNTER — Ambulatory Visit (INDEPENDENT_AMBULATORY_CARE_PROVIDER_SITE_OTHER): Payer: Medicare Other | Admitting: Family Medicine

## 2018-08-26 VITALS — BP 110/73 | HR 88 | Temp 98.3°F | Ht 68.0 in | Wt 221.4 lb

## 2018-08-26 DIAGNOSIS — R21 Rash and other nonspecific skin eruption: Secondary | ICD-10-CM | POA: Diagnosis not present

## 2018-08-26 DIAGNOSIS — B86 Scabies: Secondary | ICD-10-CM | POA: Diagnosis not present

## 2018-08-26 MED ORDER — PERMETHRIN 5 % EX CREA: 1.0000 "application " | TOPICAL_CREAM | Freq: Once | CUTANEOUS | 0 refills | Status: AC

## 2018-08-26 MED ORDER — BETAMETHASONE SOD PHOS & ACET 6 (3-3) MG/ML IJ SUSP
6.0000 mg | Freq: Once | INTRAMUSCULAR | Status: AC
  Administered 2018-08-26: 6 mg via INTRAMUSCULAR

## 2018-08-26 NOTE — Progress Notes (Signed)
Chief Complaint  Patient presents with  . Pruritis    pt here today c/o red dots on face and hands that he thinks is scabies    HPI  Patient presents today for multiple red spots on fingers, back and trunk. Concerned for scabiew. Very pruruitic. On papule on left cheek.  PMH: Smoking status noted ROS: Per HPI  Objective: BP 110/73   Pulse 88   Temp 98.3 F (36.8 C) (Oral)   Ht 5\' 8"  (1.727 m)   Wt 221 lb 6 oz (100.4 kg)   BMI 33.66 kg/m  Gen: NAD, alert, cooperative with exam HEENT: NCAT, EOMI, PERRL Skin:erythematous papules are 1-70mm some with burrows. Ext: No edema, warm Neuro: Alert and oriented, No gross deficits  Assessment and plan:  1. Rash   2. Scabies     Meds ordered this encounter  Medications  . betamethasone acetate-betamethasone sodium phosphate (CELESTONE) injection 6 mg  . permethrin (ELIMITE) 5 % cream    Sig: Apply 1 application topically once for 1 dose.    Dispense:  1 g    Refill:  0    No orders of the defined types were placed in this encounter.   Follow up as needed.  Claretta Fraise, MD

## 2018-10-05 ENCOUNTER — Ambulatory Visit (INDEPENDENT_AMBULATORY_CARE_PROVIDER_SITE_OTHER): Payer: Medicare Other | Admitting: Family Medicine

## 2018-10-05 ENCOUNTER — Telehealth: Payer: Self-pay | Admitting: Family Medicine

## 2018-10-05 ENCOUNTER — Encounter: Payer: Self-pay | Admitting: Family Medicine

## 2018-10-05 ENCOUNTER — Other Ambulatory Visit: Payer: Self-pay

## 2018-10-05 DIAGNOSIS — L2084 Intrinsic (allergic) eczema: Secondary | ICD-10-CM

## 2018-10-05 DIAGNOSIS — J029 Acute pharyngitis, unspecified: Secondary | ICD-10-CM

## 2018-10-05 MED ORDER — CLOTRIMAZOLE 10 MG MT TROC: OROMUCOSAL | 2 refills | Status: DC

## 2018-10-05 MED ORDER — TRIAMCINOLONE ACETONIDE 0.1 % EX CREA: 1.0000 "application " | TOPICAL_CREAM | Freq: Two times a day (BID) | CUTANEOUS | 0 refills | Status: DC

## 2018-10-05 MED ORDER — FLUCONAZOLE 100 MG PO TABS: ORAL_TABLET | ORAL | 0 refills | Status: DC

## 2018-10-05 NOTE — Progress Notes (Signed)
No chief complaint on file.   HPI  Patient presents today for slime built up in throat. Taking allergy med. Feels like infection. Feels like it is choking him.  Gargling didn't help. Onset 2 weeks ago. No improvement. No fever.  Did the full treatment for scabies with wife. Red dots on hands only. Onset last month.  PMH: Smoking status noted ROS: Per HPI  Objective: There were no vitals taken for this visit. Gen: NAD, alert, cooperative  Assessment and plan:  1. Pharyngitis, unspecified etiology   2. Intrinsic eczema     Meds ordered this encounter  Medications  . fluconazole (DIFLUCAN) 100 MG tablet    Sig: Take two with first dose. Then starting the next day take one daily until all are taken.    Dispense:  15 tablet    Refill:  0  . clotrimazole (MYCELEX) 10 MG troche    Sig: Allow one to dissolve in the mouth 5 times daily For yeast    Dispense:  35 tablet    Refill:  2  . triamcinolone cream (KENALOG) 0.1 %    Sig: Apply 1 application topically 2 (two) times daily. To affected areas only    Dispense:  45 g    Refill:  0    No orders of the defined types were placed in this encounter. Virtual Visit via telephone Note  I discussed the limitations, risks, security and privacy concerns of performing an evaluation and management service by telephone and the availability of in person appointments. I also discussed with the patient that there may be a patient responsible charge related to this service. The patient expressed understanding and agreed to proceed.  Follow Up Instructions:   I discussed the assessment and treatment plan with the patient. The patient was provided an opportunity to ask questions and all were answered. The patient agreed with the plan and demonstrated an understanding of the instructions.   The patient was advised to call back or seek an in-person evaluation if the symptoms worsen or if the condition fails to improve as anticipated.  Call  started: 9:44 Call ended:  9:53 Call minutes: 9   Follow up as needed.  Claretta Fraise, MD

## 2018-10-18 ENCOUNTER — Ambulatory Visit (INDEPENDENT_AMBULATORY_CARE_PROVIDER_SITE_OTHER): Payer: Medicare Other | Admitting: Nurse Practitioner

## 2018-10-18 ENCOUNTER — Telehealth: Payer: Self-pay | Admitting: Family Medicine

## 2018-10-18 ENCOUNTER — Other Ambulatory Visit: Payer: Self-pay

## 2018-10-18 ENCOUNTER — Encounter: Payer: Self-pay | Admitting: Nurse Practitioner

## 2018-10-18 DIAGNOSIS — Z20828 Contact with and (suspected) exposure to other viral communicable diseases: Secondary | ICD-10-CM | POA: Diagnosis not present

## 2018-10-18 DIAGNOSIS — J069 Acute upper respiratory infection, unspecified: Secondary | ICD-10-CM | POA: Diagnosis not present

## 2018-10-18 DIAGNOSIS — Z20822 Contact with and (suspected) exposure to covid-19: Secondary | ICD-10-CM

## 2018-10-18 NOTE — Telephone Encounter (Signed)
Patient states he has an intermittent headache, chest congestion, with slight shortness of breath with exertion.  Denies fever or cough.  His shortness of breath is not detectible over the phone, and he states he does not feel it is extreme.  He states his mother claims she tested positive for COVID 19, but he does not think this is true, but he would rather "be safe than sorry".  Scheduled him for telephone visit today at 2:30 pm with MMM.

## 2018-10-18 NOTE — Progress Notes (Signed)
Patient ID: Preston Berry, male   DOB: 11-Jan-1986, 33 y.o.   MRN: [id number]    Virtual Visit via telephone Note  I connected with Preston Berry on 10/18/18 at 2:00 PM by telephone and verified that I am speaking with the correct person using two identifiers. Preston Berry is currently located at home and no one is currently with her during visit. The provider, Mary-Margaret Hassell Done, FNP is located in their office at time of visit.  I discussed the limitations, risks, security and privacy concerns of performing an evaluation and management service by telephone and the availability of in person appointments. I also discussed with the patient that there may be a patient responsible charge related to this service. The patient expressed understanding and agreed to proceed.   History and Present Illness:   Chief Complaint: Nasal Congestion   HPI Patient calls in today c/o congestion and cough. He was around his mother last week who tested positive for corona virus. He says that he has slight SOB when he is going up steps. Denies fever or headaches.     Review of Systems  Constitutional: Negative for chills and fever.  HENT: Negative for congestion, ear discharge and sore throat.   Respiratory: Positive for shortness of breath (mild).   Cardiovascular: Negative for chest pain.  Neurological: Negative.   Psychiatric/Behavioral: Negative.      Observations/Objective: Alert and oriented- answers all questions appropriately No cough or sob  Noted during phone call  Assessment and Plan: Preston Berry in today with chief complaint of Nasal Congestion   1. Upper respiratory tract infection, unspecified type 2. Exposure to Covid-19 Virus  Based on your current symptoms, you may very well have the virus, however your symptoms are mild. Currently, not all patients are being tested. If the symptoms are mild and there is not a known exposure, performing the test is not indicated.   Coronavirus disease 2019 (COVID-19) is a respiratory illness that can spread from person to person. The virus that causes COVID-19 is a new virus that was first identified in the country of [place] but is now found in multiple other countries and has spread to the [place].  Symptoms associated with the virus are mild to severe fever, cough, and shortness of breath. There is currently no vaccine to protect against COVID-19, and there is no specific antiviral treatment for the virus.   To be considered HIGH RISK for Coronavirus (COVID-19), you have to meet the following criteria:  . Traveled to [place], [place], [place], [place] or [place]; or in the [place] to [place], [place], [place], or [place]; and have fever, cough, and shortness of breath within the last 2 weeks of travel OR  . Been in close contact with a person diagnosed with COVID-19 within the last 2 weeks and have fever, cough, and shortness of breath  . IF YOU DO NOT MEET THESE CRITERIA, YOU ARE CONSIDERED LOW RISK FOR COVID-19.   It is vitally important that if you feel that you have an infection such as this virus or any other virus that you stay home and away from places where you may spread it to others.  You should self-quarantine for 14 days if you have symptoms that could potentially be coronavirus and avoid contact with people age 41 and older.   You can use medication such as delsym or mucinex OTC if develop cough  You may also take acetaminophen (Tylenol) as needed for  fever.   Reduce your risk of any infection by using the same precautions used for avoiding the common cold or flu:  Marland Kitchen Wash your hands often with soap and warm water for at least 20 seconds.  If soap and water are not readily available, use an alcohol-based hand sanitizer with at least 60% alcohol.  . If coughing or sneezing, cover your mouth and nose by coughing or sneezing into the elbow areas of your shirt or coat, into a tissue or into your  sleeve (not your hands). . Avoid shaking hands with others and consider head nods or verbal greetings only. . Avoid touching your eyes, nose, or mouth with unwashed hands.  . Avoid close contact with people who are sick. . Avoid places or events with large numbers of people in one location, like concerts or sporting events. . Carefully consider travel plans you have or are making. . If you are planning any travel outside or inside the [place], visit the CDC's Travelers' Health webpage for the latest health notices. . If you have some symptoms but not all symptoms, continue to monitor at home and seek medical attention if your symptoms worsen. . If you are having a medical emergency, call 911.  HOME CARE . Only take medications as instructed by your medical team. . Drink plenty of fluids and get plenty of rest. . A steam or ultrasonic humidifier can help if you have congestion.   GET HELP RIGHT AWAY IF: . You develop worsening fever. . You become short of breath . You cough up blood. . Your symptoms become more severe MAKE SURE YOU   Understand these instructions.  Will watch your condition.  Will get help right away if you are not doing well or get worse.  Your e-visit answers were reviewed by a board certified advanced clinical practitioner to complete your personal care plan.  Depending on the condition, your plan could have included both over the counter or prescription medications.  If there is a problem please reply once you have received a response from your provider. Your safety is important to [place].  If you have drug allergies check your prescription carefully.    You can use MyChart to ask questions about today's visit, request a non-urgent call back, or ask for a work or school excuse for 24 hours related to this e-Visit. If it has been greater than 24 hours you will need to follow up with your provider, or enter a new e-Visit to address those concerns. You will get an e-mail in  the next two days asking about your experience.  I hope that your e-visit has been valuable and will speed your recovery. Thank you for using e-visits.      Follow Up Instructions:  prn    I discussed the assessment and treatment plan with the patient. The patient was provided an opportunity to ask questions and all were answered. The patient agreed with the plan and demonstrated an understanding of the instructions.   The patient was advised to call back or seek an in-person evaluation if the symptoms worsen or if the condition fails to improve as anticipated.  The above assessment and management plan was discussed with the patient. The patient verbalized understanding of and has agreed to the management plan. Patient is aware to call the clinic if symptoms persist or worsen. Patient is aware when to return to the clinic for a follow-up visit. Patient educated on when it is appropriate to go to  the emergency department.    I provided 10 minutes of non-face-to-face time during this encounter.    Mary-Margaret Hassell Done, FNP

## 2018-10-28 ENCOUNTER — Encounter: Payer: Self-pay | Admitting: Nurse Practitioner

## 2018-10-28 ENCOUNTER — Other Ambulatory Visit: Payer: Self-pay

## 2018-10-28 ENCOUNTER — Ambulatory Visit (INDEPENDENT_AMBULATORY_CARE_PROVIDER_SITE_OTHER): Payer: Medicare Other | Admitting: Nurse Practitioner

## 2018-10-28 DIAGNOSIS — R6889 Other general symptoms and signs: Secondary | ICD-10-CM | POA: Diagnosis not present

## 2018-10-28 MED ORDER — FLUTICASONE PROPIONATE 50 MCG/ACT NA SUSP: 2.0000 | Freq: Every day | NASAL | 6 refills | Status: DC

## 2018-10-28 NOTE — Progress Notes (Signed)
Patient ID: SHALAMAR CRAYS, male   DOB: 08/27/1985, 33 y.o.   MRN: [id number]     Virtual Visit via telephone Note  I connected with [name] on 10/28/18 at 2:25PM by telephone and verified that I am speaking with the correct person using two identifiers. MYREON WIMER is currently located at home and no one is currently with her during visit. The provider, Mary-Margaret Hassell Done, FNP is located in their office at time of visit.  I discussed the limitations, risks, security and privacy concerns of performing an evaluation and management service by telephone and the availability of in person appointments. I also discussed with the patient that there may be a patient responsible charge related to this service. The patient expressed understanding and agreed to proceed.   History and Present Illness:   Chief Complaint: feels like slime in throat  HPI Patient had televisit with Dr. Livia Snellen on 10/05/18 and he diagnosed him with thrush. He was given myclex torches, and diflucan. That did not help. He called back on 10/18/18 stating he was exposed to corona virus by his mom. Come to find out he was not really exposed to corona. He does have a cough and mucus slime comes up, but does not feel like he is getting everything out.       Review of Systems  Constitutional: Negative for diaphoresis and weight loss.  HENT: Positive for congestion.   Eyes: Negative for blurred vision, double vision and pain.  Respiratory: Negative for shortness of breath.   Cardiovascular: Negative for chest pain, palpitations, orthopnea and leg swelling.  Gastrointestinal: Negative for abdominal pain.  Skin: Negative for rash.  Neurological: Negative for dizziness, sensory change, loss of consciousness, weakness and headaches.  Endo/Heme/Allergies: Negative for polydipsia. Does not bruise/bleed easily.  Psychiatric/Behavioral: Negative for memory loss. The patient does not have insomnia.   All other systems  reviewed and are negative.    Observations/Objective: Alert and oriented- answers all questions appropriately Patient is very aggitated because no better  Assessment and Plan: [name] in today with chief complaint of No chief complaint on file.   1. Throat congestion mucinex D OTC Force fluids Run humidifier Meds ordered this encounter  Medications  . fluticasone (FLONASE) 50 MCG/ACT nasal spray    Sig: Place 2 sprays into both nostrils daily.    Dispense:  16 g    Refill:  6    Order Specific Question:   Supervising Provider    Answer:   Caryl Pina A [[id number]]     Follow Up Instructions:  If no better in a couple of days will need to be seen    I discussed the assessment and treatment plan with the patient. The patient was provided an opportunity to ask questions and all were answered. The patient agreed with the plan and demonstrated an understanding of the instructions.   The patient was advised to call back or seek an in-person evaluation if the symptoms worsen or if the condition fails to improve as anticipated.  The above assessment and management plan was discussed with the patient. The patient verbalized understanding of and has agreed to the management plan. Patient is aware to call the clinic if symptoms persist or worsen. Patient is aware when to return to the clinic for a follow-up visit. Patient educated on when it is appropriate to go to the emergency department.    I provided 12 minutes of non-face-to-face time during this encounter.  Mary-Margaret Mykenzie Ebanks, FNP   

## 2018-11-16 ENCOUNTER — Telehealth: Payer: Self-pay | Admitting: *Deleted

## 2018-11-16 NOTE — Telephone Encounter (Signed)
Pt called with complaint of continued congestion Pt wanted to be seen in office Explained to pt, at this time , we are not seeing URI sxs in the office Pt declined televisit due to not being able to schedule with MMM Pt can use OTC Claritin and Mucinex or can go to Urgent Care for evaluation Pt verbalizes understanding

## 2018-12-28 ENCOUNTER — Other Ambulatory Visit: Payer: Self-pay

## 2018-12-28 ENCOUNTER — Observation Stay (HOSPITAL_COMMUNITY)
Admission: EM | Admit: 2018-12-28 | Discharge: 2018-12-28 | Disposition: A | Discharge status: Home / Self Care | Payer: Medicare Other | Attending: Internal Medicine | Admitting: Internal Medicine

## 2018-12-28 ENCOUNTER — Emergency Department (HOSPITAL_COMMUNITY): Payer: Medicare Other

## 2018-12-28 ENCOUNTER — Observation Stay (HOSPITAL_COMMUNITY): Payer: Medicare Other

## 2018-12-28 ENCOUNTER — Encounter (HOSPITAL_COMMUNITY): Payer: Self-pay

## 2018-12-28 DIAGNOSIS — E860 Dehydration: Secondary | ICD-10-CM | POA: Insufficient documentation

## 2018-12-28 DIAGNOSIS — R072 Precordial pain: Principal | ICD-10-CM | POA: Insufficient documentation

## 2018-12-28 DIAGNOSIS — I10 Essential (primary) hypertension: Secondary | ICD-10-CM | POA: Insufficient documentation

## 2018-12-28 DIAGNOSIS — R160 Hepatomegaly, not elsewhere classified: Secondary | ICD-10-CM | POA: Insufficient documentation

## 2018-12-28 DIAGNOSIS — Z7951 Long term (current) use of inhaled steroids: Secondary | ICD-10-CM | POA: Diagnosis not present

## 2018-12-28 DIAGNOSIS — I499 Cardiac arrhythmia, unspecified: Secondary | ICD-10-CM | POA: Diagnosis not present

## 2018-12-28 DIAGNOSIS — Z1159 Encounter for screening for other viral diseases: Secondary | ICD-10-CM | POA: Diagnosis not present

## 2018-12-28 DIAGNOSIS — K219 Gastro-esophageal reflux disease without esophagitis: Secondary | ICD-10-CM | POA: Insufficient documentation

## 2018-12-28 DIAGNOSIS — Z833 Family history of diabetes mellitus: Secondary | ICD-10-CM | POA: Diagnosis not present

## 2018-12-28 DIAGNOSIS — Z87891 Personal history of nicotine dependence: Secondary | ICD-10-CM | POA: Insufficient documentation

## 2018-12-28 DIAGNOSIS — N62 Hypertrophy of breast: Secondary | ICD-10-CM | POA: Diagnosis not present

## 2018-12-28 DIAGNOSIS — M1990 Unspecified osteoarthritis, unspecified site: Secondary | ICD-10-CM | POA: Insufficient documentation

## 2018-12-28 DIAGNOSIS — Z8249 Family history of ischemic heart disease and other diseases of the circulatory system: Secondary | ICD-10-CM | POA: Diagnosis not present

## 2018-12-28 DIAGNOSIS — F319 Bipolar disorder, unspecified: Secondary | ICD-10-CM | POA: Insufficient documentation

## 2018-12-28 DIAGNOSIS — F329 Major depressive disorder, single episode, unspecified: Secondary | ICD-10-CM | POA: Insufficient documentation

## 2018-12-28 DIAGNOSIS — R079 Chest pain, unspecified: Secondary | ICD-10-CM

## 2018-12-28 DIAGNOSIS — E7800 Pure hypercholesterolemia, unspecified: Secondary | ICD-10-CM | POA: Insufficient documentation

## 2018-12-28 DIAGNOSIS — Z7982 Long term (current) use of aspirin: Secondary | ICD-10-CM | POA: Diagnosis not present

## 2018-12-28 DIAGNOSIS — I213 ST elevation (STEMI) myocardial infarction of unspecified site: Secondary | ICD-10-CM | POA: Diagnosis not present

## 2018-12-28 DIAGNOSIS — E785 Hyperlipidemia, unspecified: Secondary | ICD-10-CM | POA: Diagnosis not present

## 2018-12-28 HISTORY — DX: Chest pain, unspecified: R07.9

## 2018-12-28 LAB — TROPONIN I: Troponin I: <0.03 ng/mL (ref <0.03)

## 2018-12-28 LAB — BASIC METABOLIC PANEL
Anion gap: 10 (ref 5–15)
BUN: 12 mg/dL (ref 6–20)
CO2: 25 mmol/L (ref 22–32)
Calcium: 9 mg/dL (ref 8.9–10.3)
Chloride: 102 mmol/L (ref 98–111)
Creatinine, Ser: 0.97 mg/dL (ref 0.61–1.24)
GFR calc Af Amer: >60 mL/min (ref >60)
GFR calc non Af Amer: >60 mL/min (ref >60)
Glucose, Bld: 108 mg/dL — ABNORMAL HIGH (ref 70–99)
Potassium: 4.5 mmol/L (ref 3.5–5.1)
Sodium: 137 mmol/L (ref 135–145)

## 2018-12-28 LAB — CBC
HCT: 42.8 % (ref 39.0–52.0)
Hemoglobin: 14.8 g/dL (ref 13.0–17.0)
MCH: 30.6 pg (ref 26.0–34.0)
MCHC: 34.6 g/dL (ref 30.0–36.0)
MCV: 88.6 fL (ref 80.0–100.0)
Platelets: 340 10*3/uL (ref 150–400)
RBC: 4.83 MIL/uL (ref 4.22–5.81)
RDW: 12.1 % (ref 11.5–15.5)
WBC: 9 10*3/uL (ref 4.0–10.5)
nRBC: 0 % (ref 0.0–0.2)

## 2018-12-28 LAB — I-STAT TROPONIN, ED: Troponin i, poc: 0.02 ng/mL (ref 0.00–0.08)

## 2018-12-28 LAB — D-DIMER, QUANTITATIVE (NOT AT ARMC): D-Dimer, Quant: 0.3 ug/mL-FEU (ref 0.00–0.50)

## 2018-12-28 MED ORDER — METOPROLOL TARTRATE 50 MG PO TABS
50.0000 mg | ORAL_TABLET | Freq: Once | ORAL | Status: AC
  Administered 2018-12-28: 50 mg via ORAL
  Filled 2018-12-28: qty 1

## 2018-12-28 MED ORDER — LIDOCAINE VISCOUS HCL 2 % MT SOLN
15.0000 mL | Freq: Once | OROMUCOSAL | Status: AC
  Administered 2018-12-28: 15 mL via OROMUCOSAL
  Filled 2018-12-28: qty 15

## 2018-12-28 MED ORDER — ACETAMINOPHEN 325 MG PO TABS: 650.0000 mg | ORAL_TABLET | ORAL | Status: DC | PRN

## 2018-12-28 MED ORDER — ENOXAPARIN SODIUM 40 MG/0.4ML ~~LOC~~ SOLN
40.0000 mg | Freq: Every day | SUBCUTANEOUS | Status: DC
  Filled 2018-12-28: qty 0.4

## 2018-12-28 MED ORDER — NITROGLYCERIN 0.4 MG SL SUBL
SUBLINGUAL_TABLET | SUBLINGUAL | Status: AC
  Administered 2018-12-28: 0.8 mg via SUBLINGUAL
  Filled 2018-12-28: qty 2

## 2018-12-28 MED ORDER — SODIUM CHLORIDE 0.9% FLUSH: 3.0000 mL | Freq: Once | INTRAVENOUS | Status: DC

## 2018-12-28 MED ORDER — NITROGLYCERIN 0.4 MG SL SUBL
0.8000 mg | SUBLINGUAL_TABLET | Freq: Once | SUBLINGUAL | Status: AC
  Administered 2018-12-28: 0.8 mg via SUBLINGUAL

## 2018-12-28 MED ORDER — ONDANSETRON HCL 4 MG/2ML IJ SOLN: 4.0000 mg | Freq: Four times a day (QID) | INTRAMUSCULAR | Status: DC | PRN

## 2018-12-28 MED ORDER — IOHEXOL 350 MG/ML SOLN
100.0000 mL | Freq: Once | INTRAVENOUS | Status: AC | PRN
  Administered 2018-12-28: 100 mL via INTRAVENOUS

## 2018-12-28 MED ORDER — ALUM & MAG HYDROXIDE-SIMETH 200-200-20 MG/5ML PO SUSP
30.0000 mL | Freq: Once | ORAL | Status: AC
  Administered 2018-12-28: 30 mL via ORAL
  Filled 2018-12-28: qty 30

## 2018-12-28 NOTE — Discharge Summary (Signed)
Physician Discharge Summary  BURNETT SPRAY JQB:[id number] DOB: 1985/07/21 DOA: 12/28/2018  PCP: Patient, No Pcp Per  Admit date: 12/28/2018 Discharge date: 12/28/2018  Admitted From: Home Discharge disposition: Home   Code Status: Full Code   Recommendations for Outpatient Follow-Up:   1. Monitor blood pressure at home.  Follow-up with PCP as an outpatient  Discharge Diagnosis:   Active Problems:   Chest pain, rule out acute myocardial infarction    History of Present Illness / Brief narrative:  Preston Berry is a 33 y.o. male with prior history of hypertension and hyperlipidemia who presented to the ED on 6/22 after an episode of AT stroke/dehydration after using his lawnmower. He suddenly felt dizzy when he bent over.  He thought he was dehydrated. He went home and had a burger in long time.  Then he started to having lower sternal tightness radiating across the chest.  This is somewhat similar to his typical GERD episode but was different in radiation.  He chew aspirin 324 mg and EMS was called.  Initially code STEMI was called but cancel after cardiology reviewed EKG. Patient denies orthopnea, PND, syncope, lower extremity edema, fever, chills or exposure to COVID. No radiation.    D-dimer negative.  Troponin x2 negative.  COVID pending.   Chest x-ray without acute cardiopulmonary disease.    Patient was admitted for further evaluation.  Cardiology consultation was obtained. He was seen previously by cardiology service in 2017 when he presented [facility] as a code STEMI.  However code was canceled upon arrival to ER at [facility].  Felt his chest discomfort was likely musculoskeletal in etiology.  Echocardiogram during that time admission showed LV function of 65 to 70%.  Outpatient exercise tolerance test was negative for ischemia.  Reports previously on medication for high blood pressure and cholesterol which has been discontinued by PCP since dietary changes and  regular exercise.  Biological father had an MI in his 72's. Maternal grandmother with an MI in her 79's.  Hospital Course:  Chest pain -Initially code STEMI was called however canceled after reviewed by cardiologist.  Similar to 2017 episode. -His chest pain episode likely due to dehydration/heat stroke/GI etiology. -Low suspicious for ACS.  EKG without acute ischemic changes.  Troponin negative. Patient had normal ETT and echocardiogram in 2017. -Cardiology recommendation, cardiac CT was obtained.  Coronary calcium score was 0 suggesting low risk for future cardiac events.  History of hypertension and hyperlipidemia -He has been taken off of medications by PCP since he had made dietary changes and  doing regular exercise. -Blood pressure elevated to 140s 150s while in the hospital.  Patient will monitor as an outpatient follow-up with his primary care provider.   Subjective:  Patient was seen and examined this morning.  [place] Caucasian male.  Lying down in bed.  Not in distress.  No chest pain or shortness of breath at this time.  Discharge Exam:   Vitals:   12/28/18 0841 12/28/18 1120 12/28/18 1144 12/28/18 1229  BP: (!) 146/94 (!) 141/87 (!) 142/89   Pulse: 80 63 62 66  Resp: 18 15 16    Temp: 98.7 F (37.1 C)  98.6 F (37 C)   TempSrc: Oral  Oral   SpO2: 98% 98% 100%     There is no height or weight on file to calculate BMI.  General exam: Appears calm and comfortable.  Skin: No rashes, lesions or ulcers. HEENT: Atraumatic, normocephalic, supple neck, no obvious bleeding  Lungs: Clear to auscultation bilaterally CVS: Regular rate and rhythm, no murmur GI/Abd soft, nontender, nondistended, bowel sound present CNS: Alert, awake, oriented x3 Psychiatry: Mood appropriate Extremities: No pedal edema, no calf tenderness  Discharge Instructions:  Wound care: None Discharge Instructions    Diet - low sodium heart healthy   Complete by: As directed    Increase activity  slowly   Complete by: As directed       Allergies as of 12/28/2018      Reactions   Penicillins Anaphylaxis   swelling   Keflex [cephalexin]    Patient states he is allergic to cephlasporins. Unknown reaction      Medication List    TAKE these medications   aspirin 81 MG tablet Take 81 mg by mouth as needed for pain (chest pain).   clotrimazole 10 MG troche Commonly known as: MYCELEX Allow one to dissolve in the mouth 5 times daily For yeast   fluconazole 100 MG tablet Commonly known as: Diflucan Take two with first dose. Then starting the next day take one daily until all are taken.   fluticasone 50 MCG/ACT nasal spray Commonly known as: FLONASE Place 2 sprays into both nostrils daily.   pantoprazole 40 MG tablet Commonly known as: PROTONIX Take 1 tablet (40 mg total) by mouth daily.   triamcinolone cream 0.1 % Commonly known as: KENALOG Apply 1 application topically 2 (two) times daily. To affected areas only       Time coordinating discharge: 25 minutes  The results of significant diagnostics from this hospitalization (including imaging, microbiology, ancillary and laboratory) are listed below for reference.    Procedures and Diagnostic Studies:   Dg Chest 2 View  Result Date: 12/28/2018 CLINICAL DATA:  Chest pain EXAM: CHEST - 2 VIEW COMPARISON:  12/20/2016 FINDINGS: The heart size and mediastinal contours are within normal limits. Both lungs are clear. The visualized skeletal structures are unremarkable. IMPRESSION: No active cardiopulmonary disease. Electronically Signed   By: Ulyses Jarred M.D.   On: 12/28/2018 03:05   Ct Coronary Morph W/cta Cor W/score W/ca W/cm &/or Wo/cm  Addendum Date: 12/28/2018   ADDENDUM REPORT: 12/28/2018 14:19 EXAM: OVER-READ INTERPRETATION  CT CHEST The following report is an over-read performed by radiologist Dr. Alvino Blood [facility] on 12/28/2018. This over-read does not include interpretation of cardiac or  coronary anatomy or pathology. The CTA interpretation by the cardiologist is attached. COMPARISON:  None. FINDINGS: Limited view of the lung parenchyma demonstrates no suspicious nodularity. Airways are normal. Limited view of the mediastinum demonstrates no adenopathy. Esophagus normal. Limited view of the upper abdomen unremarkable. Limited view of the skeleton and chest wall is unremarkable. IMPRESSION: No significant extracardiac findings Electronically Signed   By: Suzy Bouchard M.D.   On: 12/28/2018 14:19   Result Date: 12/28/2018 CLINICAL DATA:  Chest pain EXAM: Cardiac CTA MEDICATIONS: Sub lingual nitro. 4mg  x 2 TECHNIQUE: The patient was scanned on a Siemens 834 slice scanner. Gantry rotation speed was 250 msecs. Collimation was 0.6 mm. A 100 kV prospective scan was triggered in the ascending thoracic aorta at 35-75% of the R-R interval. Average HR during the scan was 60 bpm. The 3D data set was interpreted on a dedicated work station using MPR, MIP and VRT modes. A total of 80cc of contrast was used. FINDINGS: Non-cardiac: See separate report from [facility]. Pulmonary veins drain normally to the left atrium. Calcium Score: 0 Agatston units. Coronary Arteries: Right dominant with no anomalies LM:  Short, no plaque or stenosis. LAD system:  No plaque or stenosis. Circumflex system: Small to moderate ramus. No plaque or stenosis in the LCx system. RCA system: No plaque or stenosis. IMPRESSION: 1. Coronary artery calcium score 0 Agatston units, suggesting low risk for future cardiac events. 2.  No significant coronary disease noted. 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 On: 12/28/2018 13:11     Labs:   Basic Metabolic Panel: Recent Labs  Lab 12/28/18 0238  NA 137  K 4.5  CL 102  CO2 25  GLUCOSE 108*  BUN 12  CREATININE 0.97  CALCIUM 9.0   GFR CrCl cannot be calculated (Unknown ideal weight.). Liver Function Tests: No results for input(s): AST, ALT,  ALKPHOS, BILITOT, PROT, ALBUMIN in the last 168 hours. No results for input(s): LIPASE, AMYLASE in the last 168 hours. No results for input(s): AMMONIA in the last 168 hours. Coagulation profile No results for input(s): INR, PROTIME in the last 168 hours.  CBC: Recent Labs  Lab 12/28/18 0238  WBC 9.0  HGB 14.8  HCT 42.8  MCV 88.6  PLT 340   Cardiac Enzymes: Recent Labs  Lab 12/28/18 0839  TROPONINI <0.03   BNP: Invalid input(s): POCBNP CBG: No results for input(s): GLUCAP in the last 168 hours. D-Dimer Recent Labs    12/28/18 0238  DDIMER 0.30   Hgb A1c No results for input(s): HGBA1C in the last 72 hours. Lipid Profile No results for input(s): CHOL, HDL, LDLCALC, TRIG, CHOLHDL, LDLDIRECT in the last 72 hours. Thyroid function studies No results for input(s): TSH, T4TOTAL, T3FREE, THYROIDAB in the last 72 hours.  Invalid input(s): FREET3 Anemia work up No results for input(s): VITAMINB12, FOLATE, FERRITIN, TIBC, IRON, RETICCTPCT in the last 72 hours. Microbiology No results found for this or any previous visit (from the past 240 hour(s)).  Signed: Terrilee Croak  [facility] 12/28/2018, 2:57 PM
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
## 2018-12-28 NOTE — ED Triage Notes (Signed)
Pt arrived via [facility]; pt from [facility] with c/o CP x 6hrs; started while mowing the lawn; pt states that pain was on R side and became centralized; VSS; 324mg  ASA; 200cc NS rec'd; 147/89; 102; 22; 96% on RA;
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
## 2018-12-29 LAB — NOVEL CORONAVIRUS, NAA (HOSP ORDER, SEND-OUT TO REF LAB; TAT 18-24 HRS): SARS-CoV-2, NAA: NOT DETECTED

## 2018-12-29 LAB — HIV ANTIBODY (ROUTINE TESTING W REFLEX): HIV Screen 4th Generation wRfx: NONREACTIVE

## 2019-01-12 ENCOUNTER — Other Ambulatory Visit: Payer: Self-pay

## 2019-01-13 ENCOUNTER — Ambulatory Visit (INDEPENDENT_AMBULATORY_CARE_PROVIDER_SITE_OTHER): Payer: Medicare Other | Admitting: Family Medicine

## 2019-01-13 ENCOUNTER — Encounter: Payer: Self-pay | Admitting: Family Medicine

## 2019-01-13 VITALS — BP 132/88 | HR 81 | Temp 99.1°F | Ht 68.0 in | Wt 215.0 lb

## 2019-01-13 DIAGNOSIS — Z09 Encounter for follow-up examination after completed treatment for conditions other than malignant neoplasm: Secondary | ICD-10-CM

## 2019-01-13 DIAGNOSIS — I10 Essential (primary) hypertension: Secondary | ICD-10-CM

## 2019-01-13 DIAGNOSIS — E782 Mixed hyperlipidemia: Secondary | ICD-10-CM | POA: Diagnosis not present

## 2019-01-13 DIAGNOSIS — K219 Gastro-esophageal reflux disease without esophagitis: Secondary | ICD-10-CM | POA: Diagnosis not present

## 2019-01-13 HISTORY — DX: Essential (primary) hypertension: I10

## 2019-01-13 MED ORDER — ATORVASTATIN CALCIUM 20 MG PO TABS: 20.0000 mg | ORAL_TABLET | Freq: Every day | ORAL | 3 refills | Status: DC

## 2019-01-13 MED ORDER — PANTOPRAZOLE SODIUM 40 MG PO TBEC: 40.0000 mg | DELAYED_RELEASE_TABLET | Freq: Every day | ORAL | 3 refills | Status: DC

## 2019-01-13 MED ORDER — LISINOPRIL 5 MG PO TABS: 5.0000 mg | ORAL_TABLET | Freq: Every day | ORAL | 3 refills | Status: DC

## 2019-01-13 NOTE — Patient Instructions (Signed)

## 2019-01-14 LAB — CBC WITH DIFFERENTIAL/PLATELET
Basophils Absolute: 0.1 10*3/uL (ref 0.0–0.2)
Basos: 1 %
EOS (ABSOLUTE): 0.1 10*3/uL (ref 0.0–0.4)
Eos: 2 %
Hematocrit: 50.4 % (ref 37.5–51.0)
Hemoglobin: 16.7 g/dL (ref 13.0–17.7)
Immature Grans (Abs): 0 10*3/uL (ref 0.0–0.1)
Immature Granulocytes: 0 %
Lymphocytes Absolute: 2.5 10*3/uL (ref 0.7–3.1)
Lymphs: 41 %
MCH: 29.5 pg (ref 26.6–33.0)
MCHC: 33.1 g/dL (ref 31.5–35.7)
MCV: 89 fL (ref 79–97)
Monocytes Absolute: 0.5 10*3/uL (ref 0.1–0.9)
Monocytes: 9 %
Neutrophils Absolute: 2.9 10*3/uL (ref 1.4–7.0)
Neutrophils: 47 %
Platelets: 355 10*3/uL (ref 150–450)
RBC: 5.67 x10E6/uL (ref 4.14–5.80)
RDW: 12.5 % (ref 11.6–15.4)
WBC: 6 10*3/uL (ref 3.4–10.8)

## 2019-01-14 LAB — LIPID PANEL
Chol/HDL Ratio: 5.7 ratio — ABNORMAL HIGH (ref 0.0–5.0)
Cholesterol, Total: 216 mg/dL — ABNORMAL HIGH (ref 100–199)
HDL: 38 mg/dL — ABNORMAL LOW (ref >39)
LDL Calculated: 98 mg/dL (ref 0–99)
Triglycerides: 399 mg/dL — ABNORMAL HIGH (ref 0–149)
VLDL Cholesterol Cal: 80 mg/dL — ABNORMAL HIGH (ref 5–40)

## 2019-01-14 LAB — CMP14+EGFR
ALT: 44 IU/L (ref 0–44)
AST: 23 IU/L (ref 0–40)
Albumin/Globulin Ratio: 1.8 (ref 1.2–2.2)
Albumin: 4.8 g/dL (ref 4.0–5.0)
Alkaline Phosphatase: 88 IU/L (ref 39–117)
BUN/Creatinine Ratio: 13 (ref 9–20)
BUN: 11 mg/dL (ref 6–20)
Bilirubin Total: 0.4 mg/dL (ref 0.0–1.2)
CO2: 26 mmol/L (ref 20–29)
Calcium: 10.5 mg/dL — ABNORMAL HIGH (ref 8.7–10.2)
Chloride: 99 mmol/L (ref 96–106)
Creatinine, Ser: 0.88 mg/dL (ref 0.76–1.27)
GFR calc Af Amer: 131 mL/min/{1.73_m2} (ref >59)
GFR calc non Af Amer: 114 mL/min/{1.73_m2} (ref >59)
Globulin, Total: 2.7 g/dL (ref 1.5–4.5)
Glucose: 83 mg/dL (ref 65–99)
Potassium: 4.7 mmol/L (ref 3.5–5.2)
Sodium: 140 mmol/L (ref 134–144)
Total Protein: 7.5 g/dL (ref 6.0–8.5)

## 2019-01-14 LAB — THYROID PANEL WITH TSH
Free Thyroxine Index: 1.7 (ref 1.2–4.9)
T3 Uptake Ratio: 25 % (ref 24–39)
T4, Total: 6.6 ug/dL (ref 4.5–12.0)
TSH: 3.61 u[IU]/mL (ref 0.450–4.500)
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
## 2019-01-25 ENCOUNTER — Telehealth: Payer: Self-pay | Admitting: Family Medicine

## 2019-01-25 NOTE — Telephone Encounter (Signed)
Pt called and aware that mostly likely meds are not causing rectal bleeding.  He wants to hold all meds except protonix until appt Thursday.

## 2019-01-26 ENCOUNTER — Other Ambulatory Visit: Payer: Self-pay

## 2019-01-27 ENCOUNTER — Encounter (HOSPITAL_COMMUNITY): Payer: Self-pay

## 2019-01-27 ENCOUNTER — Encounter: Payer: Self-pay | Admitting: Family Medicine

## 2019-01-27 ENCOUNTER — Other Ambulatory Visit: Payer: Self-pay

## 2019-01-27 ENCOUNTER — Emergency Department (HOSPITAL_COMMUNITY)
Admission: EM | Admit: 2019-01-27 | Discharge: 2019-01-27 | Disposition: A | Discharge status: Home / Self Care | Payer: Medicare Other | Attending: Emergency Medicine | Admitting: Emergency Medicine

## 2019-01-27 ENCOUNTER — Emergency Department (HOSPITAL_COMMUNITY): Payer: Medicare Other

## 2019-01-27 ENCOUNTER — Ambulatory Visit (INDEPENDENT_AMBULATORY_CARE_PROVIDER_SITE_OTHER): Payer: Medicare Other | Admitting: Family Medicine

## 2019-01-27 ENCOUNTER — Telehealth: Payer: Self-pay | Admitting: Family Medicine

## 2019-01-27 ENCOUNTER — Ambulatory Visit (HOSPITAL_COMMUNITY)
Admission: RE | Admit: 2019-01-27 | Discharge: 2019-01-27 | Disposition: A | Discharge status: Home / Self Care | Payer: Medicare Other | Source: Ambulatory Visit | Attending: Family Medicine | Admitting: Family Medicine

## 2019-01-27 VITALS — BP 111/79 | HR 60 | Temp 98.9°F | Ht 68.0 in | Wt 212.0 lb

## 2019-01-27 DIAGNOSIS — R9431 Abnormal electrocardiogram [ECG] [EKG]: Secondary | ICD-10-CM

## 2019-01-27 DIAGNOSIS — R1032 Left lower quadrant pain: Secondary | ICD-10-CM

## 2019-01-27 DIAGNOSIS — Z5321 Procedure and treatment not carried out due to patient leaving prior to being seen by health care provider: Secondary | ICD-10-CM | POA: Diagnosis not present

## 2019-01-27 DIAGNOSIS — Z209 Contact with and (suspected) exposure to unspecified communicable disease: Secondary | ICD-10-CM | POA: Diagnosis not present

## 2019-01-27 DIAGNOSIS — F319 Bipolar disorder, unspecified: Secondary | ICD-10-CM | POA: Diagnosis not present

## 2019-01-27 DIAGNOSIS — R079 Chest pain, unspecified: Secondary | ICD-10-CM

## 2019-01-27 DIAGNOSIS — E7800 Pure hypercholesterolemia, unspecified: Secondary | ICD-10-CM | POA: Diagnosis not present

## 2019-01-27 DIAGNOSIS — R0789 Other chest pain: Secondary | ICD-10-CM | POA: Diagnosis not present

## 2019-01-27 DIAGNOSIS — R1013 Epigastric pain: Secondary | ICD-10-CM | POA: Diagnosis not present

## 2019-01-27 DIAGNOSIS — Z880 Allergy status to penicillin: Secondary | ICD-10-CM | POA: Diagnosis not present

## 2019-01-27 DIAGNOSIS — Z79899 Other long term (current) drug therapy: Secondary | ICD-10-CM | POA: Diagnosis not present

## 2019-01-27 DIAGNOSIS — K219 Gastro-esophageal reflux disease without esophagitis: Secondary | ICD-10-CM | POA: Diagnosis not present

## 2019-01-27 DIAGNOSIS — I10 Essential (primary) hypertension: Secondary | ICD-10-CM

## 2019-01-27 DIAGNOSIS — K921 Melena: Secondary | ICD-10-CM

## 2019-01-27 DIAGNOSIS — R58 Hemorrhage, not elsewhere classified: Secondary | ICD-10-CM | POA: Diagnosis not present

## 2019-01-27 HISTORY — DX: Essential (primary) hypertension: I10

## 2019-01-27 HISTORY — DX: Abnormal electrocardiogram (ECG) (EKG): R94.31

## 2019-01-27 MED ORDER — SODIUM CHLORIDE 0.9% FLUSH: 3.0000 mL | Freq: Once | INTRAVENOUS | Status: DC

## 2019-01-27 NOTE — Progress Notes (Signed)
Subjective:  Patient ID: Preston Berry, male    DOB: June 18, 1986, 33 y.o.   MRN: [id number]  Patient Care Team: Baruch Gouty, FNP as PCP - General (Family Medicine)   Chief Complaint:  left side abd pain, left chest pain and arm pain, Hypertension, and Blood In Stools   HPI: [name] is a 33 y.o. male presenting on 01/27/2019 for left side abd pain, left chest pain and arm pain, Hypertension, and Blood In Stools   Pt presents today for hypertension follow up. Pt states since his last visit he has developed some left sided abdominal pain, rectal bleeding, and intermittent left arm and chest pain. He states the abdominal pain started several days ago. States he has red blood in his stool. No nausea, vomiting, diarrhea, or constipation. He has stopped his ASA since noticing the blood.  He states yesterday he had an episode of left arm pain that radiated into his left upper chest. Pt states the pain was sharp and only last for a few minutes and then subsided. Pt states he did not have nausea, vomiting, diaphoresis, weakness, dizziness, shortness of breath, or syncope with the pain. States this happened again at 0300 this morning when he was watching TV. No pain at present.   Hypertension This is a chronic problem. The current episode started more than 1 month ago. The problem has been gradually improving since onset. The problem is controlled. Associated symptoms include anxiety and chest pain. Pertinent negatives include no blurred vision, headaches, malaise/fatigue, neck pain, orthopnea, palpitations, peripheral edema, PND, shortness of breath or sweats. Risk factors for coronary artery disease include obesity, male gender, sedentary lifestyle, stress and dyslipidemia. Past treatments include ACE inhibitors. The current treatment provides significant improvement. There are no compliance problems.  There is no history of kidney disease, CAD/MI, CVA, heart failure, left ventricular  hypertrophy, PVD or retinopathy.     Relevant past medical, surgical, family, and social history reviewed and updated as indicated.  Allergies and medications reviewed and updated. Date reviewed: Chart in Epic.   Past Medical History:  Diagnosis Date   Arthritis    Depression    GERD (gastroesophageal reflux disease)    Gynecomastia    Hepatic steatosis    Hepatomegaly    Hyperlipidemia    Hypertension     Past Surgical History:  Procedure Laterality Date   DEBRIDEMENT AND CLOSURE WOUND     OTHER SURGICAL HISTORY     surgery to remove glass from peritoneal and buttox area d/t sled accident    Social History   Socioeconomic History   Marital status: Legally Separated    Spouse name: Not on file   Number of children: 1   Years of education: Not on file   Highest education level: 11th grade  Occupational History   Occupation: Disabled  Scientist, 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   Food insecurity    Worry: Not on file    Inability: Not on file   Transportation needs    Medical: Not on file    Non-medical: Not on file  Tobacco Use   Smoking status: Never Smoker   Smokeless tobacco: Never Used  Substance and Sexual Activity   Alcohol use: No    Alcohol/week: 0.0 standard drinks    Frequency: Never   Drug use: No   Sexual activity: Not on file  Lifestyle   Physical activity    Days per week: 0 days  Minutes per session: 0 min   Stress: Not at all  Relationships   Social connections    Talks on phone: Twice a week    Gets together: Twice a week    Attends religious service: Not on file    Active member of club or organization: Not on file    Attends meetings of clubs or organizations: Not on file    Relationship status: Separated   Intimate partner violence    Fear of current or ex partner: No    Emotionally abused: No    Physically abused: No    Forced sexual activity: No  Other Topics Concern   Not on file    Social History Narrative   Not on file    Outpatient Encounter Medications as of 01/27/2019  Medication Sig   pantoprazole (PROTONIX) 40 MG tablet Take 1 tablet (40 mg total) by mouth daily.   aspirin 81 MG tablet Take 81 mg by mouth as needed for pain (chest pain).   atorvastatin (LIPITOR) 20 MG tablet Take 1 tablet (20 mg total) by mouth daily. (Patient not taking: Reported on 01/27/2019)   lisinopril (ZESTRIL) 5 MG tablet Take 1 tablet (5 mg total) by mouth daily. (Patient not taking: Reported on 01/27/2019)   No facility-administered encounter medications on file as of 01/27/2019.     Allergies  Allergen Reactions   Penicillins Anaphylaxis    swelling   Keflex [Cephalexin]     Patient states he is allergic to cephlasporins. Unknown reaction    Review of Systems  Constitutional: Negative for activity change, appetite change, chills, diaphoresis, fatigue, fever, malaise/fatigue and unexpected weight change.  Eyes: Negative for blurred vision, photophobia and visual disturbance.  Respiratory: Negative for cough, chest tightness, shortness of breath and wheezing.   Cardiovascular: Positive for chest pain. Negative for palpitations, orthopnea, leg swelling and PND.  Gastrointestinal: Positive for abdominal pain and blood in stool. Negative for abdominal distention, anal bleeding, constipation, diarrhea, nausea, rectal pain and vomiting.  Genitourinary: Negative for decreased urine volume and difficulty urinating.  Musculoskeletal: Negative for neck pain.  Neurological: Negative for dizziness, tremors, seizures, syncope, facial asymmetry, speech difficulty, weakness, light-headedness, numbness and headaches.  Psychiatric/Behavioral: Negative for confusion.  All other systems reviewed and are negative.       Objective:  BP 111/79    Pulse 60    Temp 98.9 F (37.2 C)    Ht '5\' 8"'  (1.727 m)    Wt 212 lb (96.2 kg)    BMI 32.23 kg/m    Wt Readings from Last 3 Encounters:   01/27/19 212 lb (96.2 kg)  01/27/19 212 lb (96.2 kg)  01/13/19 215 lb (97.5 kg)    Physical Exam Vitals signs and nursing note reviewed.  Constitutional:      General: He is not in acute distress.    Appearance: Normal appearance. He is well-developed and well-groomed. He is not ill-appearing, toxic-appearing or diaphoretic.  HENT:     Head: Normocephalic and atraumatic.     Jaw: There is normal jaw occlusion.     Right Ear: Hearing normal.     Left Ear: Hearing normal.     Nose: Nose normal.     Mouth/Throat:     Lips: Pink.     Mouth: Mucous membranes are moist.     Pharynx: Oropharynx is clear. Uvula midline.  Eyes:     General: Lids are normal.     Extraocular Movements: Extraocular movements intact.  Conjunctiva/sclera: Conjunctivae normal.     Pupils: Pupils are equal, round, and reactive to light.  Neck:     Musculoskeletal: Normal range of motion and neck supple.     Thyroid: No thyroid mass, thyromegaly or thyroid tenderness.     Vascular: No carotid bruit or JVD.     Trachea: Trachea and phonation normal.  Cardiovascular:     Rate and Rhythm: Normal rate and regular rhythm.     Chest Wall: PMI is not displaced.     Pulses: Normal pulses.     Heart sounds: Normal heart sounds. No murmur. No friction rub. No gallop.   Pulmonary:     Effort: Pulmonary effort is normal. No respiratory distress.     Breath sounds: Normal breath sounds. No wheezing.  Abdominal:     General: Bowel sounds are normal. There is no distension or abdominal bruit.     Palpations: Abdomen is soft. There is no hepatomegaly or splenomegaly.     Tenderness: There is abdominal tenderness in the periumbilical area, left upper quadrant and left lower quadrant. There is guarding. There is no right CVA tenderness, left CVA tenderness or rebound. Negative signs include Murphy's sign and McBurney's sign.     Hernia: No hernia is present.     Comments: Hemoccult positive  Genitourinary:     Prostate: Not enlarged, not tender and no nodules present.     Rectum: Normal. Guaiac result positive. No mass, tenderness, anal fissure, external hemorrhoid or internal hemorrhoid. Normal anal tone.  Musculoskeletal: Normal range of motion.     Right lower leg: No edema.     Left lower leg: No edema.  Lymphadenopathy:     Cervical: No cervical adenopathy.  Skin:    General: Skin is warm and dry.     Capillary Refill: Capillary refill takes less than 2 seconds.     Coloration: Skin is not cyanotic, jaundiced or pale.     Findings: No rash.  Neurological:     General: No focal deficit present.     Mental Status: He is alert and oriented to person, place, and time.     Cranial Nerves: Cranial nerves are intact.     Sensory: Sensation is intact.     Motor: Motor function is intact.     Coordination: Coordination is intact.     Gait: Gait is intact.     Deep Tendon Reflexes: Reflexes are normal and symmetric.  Psychiatric:        Attention and Perception: Attention and perception normal.        Mood and Affect: Mood and affect normal.        Speech: Speech normal.        Behavior: Behavior normal. Behavior is cooperative.        Thought Content: Thought content normal.        Cognition and Memory: Cognition and memory normal.        Judgment: Judgment normal.     Results for orders placed or performed in visit on 01/13/19  CBC with Differential/Platelet  Result Value Ref Range   WBC 6.0 3.4 - 10.8 x10E3/uL   RBC 5.67 4.14 - 5.80 x10E6/uL   Hemoglobin 16.7 13.0 - 17.7 g/dL   Hematocrit 50.4 37.5 - 51.0 %   MCV 89 79 - 97 fL   MCH 29.5 26.6 - 33.0 pg   MCHC 33.1 31.5 - 35.7 g/dL   RDW 12.5 11.6 - 15.4 %   Platelets 355  150 - 450 x10E3/uL   Neutrophils 47 Not Estab. %   Lymphs 41 Not Estab. %   Monocytes 9 Not Estab. %   Eos 2 Not Estab. %   Basos 1 Not Estab. %   Neutrophils Absolute 2.9 1.4 - 7.0 x10E3/uL   Lymphocytes Absolute 2.5 0.7 - 3.1 x10E3/uL   Monocytes Absolute  0.5 0.1 - 0.9 x10E3/uL   EOS (ABSOLUTE) 0.1 0.0 - 0.4 x10E3/uL   Basophils Absolute 0.1 0.0 - 0.2 x10E3/uL   Immature Granulocytes 0 Not Estab. %   Immature Grans (Abs) 0.0 0.0 - 0.1 x10E3/uL  CMP14+EGFR  Result Value Ref Range   Glucose 83 65 - 99 mg/dL   BUN 11 6 - 20 mg/dL   Creatinine, Ser 0.88 0.76 - 1.27 mg/dL   GFR calc non Af Amer 114 >59 mL/min/1.73   GFR calc Af Amer 131 >59 mL/min/1.73   BUN/Creatinine Ratio 13 9 - 20   Sodium 140 134 - 144 mmol/L   Potassium 4.7 3.5 - 5.2 mmol/L   Chloride 99 96 - 106 mmol/L   CO2 26 20 - 29 mmol/L   Calcium 10.5 (H) 8.7 - 10.2 mg/dL   Total Protein 7.5 6.0 - 8.5 g/dL   Albumin 4.8 4.0 - 5.0 g/dL   Globulin, Total 2.7 1.5 - 4.5 g/dL   Albumin/Globulin Ratio 1.8 1.2 - 2.2   Bilirubin Total 0.4 0.0 - 1.2 mg/dL   Alkaline Phosphatase 88 39 - 117 IU/L   AST 23 0 - 40 IU/L   ALT 44 0 - 44 IU/L  Lipid panel  Result Value Ref Range   Cholesterol, Total 216 (H) 100 - 199 mg/dL   Triglycerides 399 (H) 0 - 149 mg/dL   HDL 38 (L) >39 mg/dL   VLDL Cholesterol Cal 80 (H) 5 - 40 mg/dL   LDL Calculated 98 0 - 99 mg/dL   Chol/HDL Ratio 5.7 (H) 0.0 - 5.0 ratio  Thyroid Panel With TSH  Result Value Ref Range   TSH 3.610 0.450 - 4.500 uIU/mL   T4, Total 6.6 4.5 - 12.0 ug/dL   T3 Uptake Ratio 25 24 - 39 %   Free Thyroxine Index 1.7 1.2 - 4.9     EKG in office with left axis deviation and BBB. No acute ST abnormalities or ectopy. Monia Pouch, FNP-C.  Pertinent labs & imaging results that were available during my care of the patient were reviewed by me and considered in my medical decision making.  Assessment & Plan:  [place] was seen today for left side abd pain, left chest pain and arm pain, hypertension and blood in stools.  Diagnoses and all orders for this visit:  Essential hypertension BP well controlled on current medications. Pt aware to continue medications as prescribed. Labs pending. Referral to cardiology.  -     CMP14+EGFR -      Ambulatory referral to Cardiology -     EKG 12-Lead  Chest pain in adult Atypical chest pain that has subsided. Abnormal EKG in office. Will refer to cardiology. Pt aware of symptoms that require emergent evaluation and treatment. Report any new or return of symptoms.  -     Ambulatory referral to Cardiology -     EKG 12-Lead -     D-dimer, quantitative (not at [facility])  Left lower quadrant abdominal pain Blood in stool Hemoccult in office positive. No gross blood. Labs and CT ordered. Referral to GI. Pt aware of symptoms that require emergent  evaluation and treatment. Hold Aspirin.  -     Ambulatory referral to Gastroenterology -     CBC with Differential/Platelet -     CT Abdomen Pelvis W Contrast; Future  Abnormal EKG EKG without acute ST changes or ectopy. Left axis deviation and BBB. Referral to cardiology for evaluation and treatment.  -     Ambulatory referral to Cardiology     Continue all other maintenance medications.  Follow up plan: Return in about 2 weeks (around 02/10/2019), or if symptoms worsen or fail to improve, for HTN, Abd pain.   The above assessment and management plan was discussed with the patient. The patient verbalized understanding of and has agreed to the management plan. Patient is aware to call the clinic if symptoms persist or worsen. Patient is aware when to return to the clinic for a follow-up visit. Patient educated on when it is appropriate to go to the emergency department.   Monia Pouch, FNP-C [facility] [phone] 01/27/19

## 2019-01-27 NOTE — Telephone Encounter (Signed)
Pt did not get CT today = he needs to be reschedule at [facility] and called with new date ---- he developed Chest pain and CT was not completed.

## 2019-01-27 NOTE — ED Triage Notes (Signed)
Pt was seen by PCP today due to intermittent chest pain, shoulder,and arm that began last night. Pt was sent to radiology today for CT of chest and abdomen. While drinking contrast chest began hurting. Also reports he has had blood in stool

## 2019-01-28 DIAGNOSIS — R079 Chest pain, unspecified: Secondary | ICD-10-CM | POA: Diagnosis not present

## 2019-01-31 LAB — CBC WITH DIFFERENTIAL/PLATELET
Basophils Absolute: 0.1 10*3/uL (ref 0.0–0.2)
Basos: 1 %
EOS (ABSOLUTE): 0.2 10*3/uL (ref 0.0–0.4)
Eos: 2 %
Hematocrit: 44.9 % (ref 37.5–51.0)
Hemoglobin: 15.1 g/dL (ref 13.0–17.7)
Immature Grans (Abs): 0 10*3/uL (ref 0.0–0.1)
Immature Granulocytes: 0 %
Lymphocytes Absolute: 3.2 10*3/uL — ABNORMAL HIGH (ref 0.7–3.1)
Lymphs: 41 %
MCH: 29.7 pg (ref 26.6–33.0)
MCHC: 33.6 g/dL (ref 31.5–35.7)
MCV: 88 fL (ref 79–97)
Monocytes Absolute: 0.8 10*3/uL (ref 0.1–0.9)
Monocytes: 10 %
Neutrophils Absolute: 3.6 10*3/uL (ref 1.4–7.0)
Neutrophils: 46 %
Platelets: 370 10*3/uL (ref 150–450)
RBC: 5.09 x10E6/uL (ref 4.14–5.80)
RDW: 12.3 % (ref 11.6–15.4)
WBC: 7.8 10*3/uL (ref 3.4–10.8)

## 2019-01-31 LAB — D-DIMER, QUANTITATIVE

## 2019-01-31 LAB — CMP14+EGFR
ALT: 44 IU/L (ref 0–44)
AST: 23 IU/L (ref 0–40)
Albumin/Globulin Ratio: 1.8 (ref 1.2–2.2)
Albumin: 4.5 g/dL (ref 4.0–5.0)
Alkaline Phosphatase: 84 IU/L (ref 39–117)
BUN/Creatinine Ratio: 15 (ref 9–20)
BUN: 14 mg/dL (ref 6–20)
Bilirubin Total: 0.3 mg/dL (ref 0.0–1.2)
CO2: 22 mmol/L (ref 20–29)
Calcium: 10.2 mg/dL (ref 8.7–10.2)
Chloride: 98 mmol/L (ref 96–106)
Creatinine, Ser: 0.96 mg/dL (ref 0.76–1.27)
GFR calc Af Amer: 120 mL/min/{1.73_m2} (ref >59)
GFR calc non Af Amer: 104 mL/min/{1.73_m2} (ref >59)
Globulin, Total: 2.5 g/dL (ref 1.5–4.5)
Glucose: 91 mg/dL (ref 65–99)
Potassium: 4.3 mmol/L (ref 3.5–5.2)
Sodium: 139 mmol/L (ref 134–144)
Total Protein: 7 g/dL (ref 6.0–8.5)

## 2019-02-02 NOTE — Telephone Encounter (Signed)
Patient has been R/s.

## 2019-02-08 ENCOUNTER — Ambulatory Visit (INDEPENDENT_AMBULATORY_CARE_PROVIDER_SITE_OTHER): Payer: Medicare Other | Admitting: Nurse Practitioner

## 2019-02-08 ENCOUNTER — Other Ambulatory Visit: Payer: Self-pay

## 2019-02-08 ENCOUNTER — Encounter (INDEPENDENT_AMBULATORY_CARE_PROVIDER_SITE_OTHER): Payer: Self-pay | Admitting: Nurse Practitioner

## 2019-02-08 DIAGNOSIS — K5900 Constipation, unspecified: Secondary | ICD-10-CM | POA: Insufficient documentation

## 2019-02-08 DIAGNOSIS — R1032 Left lower quadrant pain: Secondary | ICD-10-CM | POA: Insufficient documentation

## 2019-02-08 DIAGNOSIS — K625 Hemorrhage of anus and rectum: Secondary | ICD-10-CM

## 2019-02-08 DIAGNOSIS — K5904 Chronic idiopathic constipation: Secondary | ICD-10-CM | POA: Diagnosis not present

## 2019-02-08 HISTORY — DX: Hemorrhage of anus and rectum: K62.5

## 2019-02-08 HISTORY — DX: Constipation, unspecified: K59.00

## 2019-02-08 HISTORY — DX: Left lower quadrant pain: R10.32

## 2019-02-08 NOTE — Patient Instructions (Signed)
1. Complete your cardiology evaluation before proceeding with a colonoscopy.  2. Avoid straining to pass a bowel movement, try taking Miralax 1 capful mixed in 8 ounces of water at bed time  3. Proceed with your abdominal/pelvic CAT scan as previously scheduled  4. Ok to use Desitin diaper rash ointment for hemorrhoid and anal fissure care, apply a small amount of Desitin inside the anal opening twice daily as needed  5. Call our office if your abdominal pain or rectal bleeding worsens

## 2019-02-08 NOTE — Progress Notes (Signed)
Subjective:    Patient ID: Preston Berry, male    DOB: [birth year], 33 y.o.   MRN: [id number]  HPI  Preston Berry is a 33 y.o. male with a PMH significant for GERD, hepatic steatosis, depression, arthritis and a traumatic rectal trauma secondary to a sledding injury. He presents today with complaints of having LLQ abdominal pain and recta bleeding for the past 3 weeks. He was evaluated by his PCP and an abdominal/pelvic CT scan was ordered, scheduled on 02/17/2019. He stated his LLQ pain is not worse since he saw his PCP. The LLQ pain comes and goes. No fever. He sometimes strains to pass a bowel movement. He reports seeing bright red blood on his stool approximately 3 times weekly. No NSAID use.No history of diverticulitis. No family history of colorectal cancer. Maternal uncle with history of stomach cancer. His significant other is present throughout his consultation and exam.   He stated having left chest pain that radiated to his left arm seen in the ED 6/23 and 01/27/2019. He was seen by cardiology, chest pain was assessed to be d/t dehydration/heat stroke vs possible GI etiology. Outpatient cardiac evaluation was recommended. He is scheduled to see a cardiologist the first week of September. No current chest pain. His father had a MI in his 47's.  Labs 01/27/2019: WBC 7.8. Hg 15.1. HCT 44.9. PLT 370. BUN 14. Cr. 0.96. Alk phos 84. AST 23. ALT 44. TSH 3.610.  Past Medical History:  Diagnosis Date  . Arthritis   . Depression   . GERD (gastroesophageal reflux disease)   . Gynecomastia   . Hepatic steatosis   . Hepatomegaly   . Hyperlipidemia   . Hypertension    Past Surgical History:  Procedure Laterality Date  . DEBRIDEMENT AND CLOSURE WOUND    . OTHER SURGICAL HISTORY     surgery to remove glass from peritoneal and buttox area d/t sled accident    Current Outpatient Medications on File Prior to Visit  Medication Sig Dispense Refill  . atorvastatin (LIPITOR) 20 MG tablet Take 1  tablet (20 mg total) by mouth daily. (Patient not taking: Reported on 01/27/2019) 90 tablet 3  . lisinopril (ZESTRIL) 5 MG tablet Take 1 tablet (5 mg total) by mouth daily. (Patient not taking: Reported on 01/27/2019) 90 tablet 3  . pantoprazole (PROTONIX) 40 MG tablet Take 1 tablet (40 mg total) by mouth daily. 90 tablet 3   No current facility-administered medications on file prior to visit.    Allergies  Allergen Reactions  . Penicillins Anaphylaxis    swelling  . Keflex [Cephalexin]     Patient states he is allergic to cephlasporins. Unknown reaction    Review of Systems  See HPI, all other systems reviewed and are negative.    Objective:   Physical Exam  Blood pressure 138/85, pulse (!) 55, temperature 98.2 F (36.8 C), height _0  (1.727 m), weight 212 lb 8 oz (96.4 kg). General: overweight male in NAD Eyes: sclera nonicteric, conjunctiva pink Neck: no thyromegaly or lymphadenopathy Heart: RRR, no murmur Lungs: lungs clear Abdomen: mild LLQ tenderness without rebound or guarding, + BS x 4 quads Rectal: small anterior fissure open without active bleeding, internal hemorrhoids, no blood or stool in rectum, limited exam, significant other present during exam, patient declined office chaperone Extremities: no edema    Assessment & Plan:  1. 33 y.o. male with LLQ abdominal pain -proceed with abdominal/pelvic CT as previously ordered -call our office if your  abdominal pain or rectal bleeding worsens  2. Rectal bleeding, small anterior anal fissure and internal hemorrhoids -schedule a diagnostic colonoscopy after cardiac evaluation completed -Miralax QD as needed -Desitin instructions provided for fissure/hemorrhoids  3. Atypical chest pain, cardiac vs GI etiology with hx of GERD -schedule EGD with colonoscopy after cardiac evaluation completed -continue Protonix 28m once daily

## 2019-02-15 ENCOUNTER — Telehealth: Payer: Self-pay | Admitting: Family Medicine

## 2019-02-15 ENCOUNTER — Ambulatory Visit (INDEPENDENT_AMBULATORY_CARE_PROVIDER_SITE_OTHER): Payer: Medicare Other | Admitting: Family Medicine

## 2019-02-15 ENCOUNTER — Other Ambulatory Visit: Payer: Self-pay

## 2019-02-15 ENCOUNTER — Encounter: Payer: Self-pay | Admitting: Family Medicine

## 2019-02-15 DIAGNOSIS — K529 Noninfective gastroenteritis and colitis, unspecified: Secondary | ICD-10-CM

## 2019-02-15 DIAGNOSIS — R1032 Left lower quadrant pain: Secondary | ICD-10-CM

## 2019-02-15 MED ORDER — METRONIDAZOLE 500 MG PO TABS: 500.0000 mg | ORAL_TABLET | Freq: Three times a day (TID) | ORAL | 0 refills | Status: DC

## 2019-02-15 MED ORDER — CIPROFLOXACIN HCL 500 MG PO TABS: 500.0000 mg | ORAL_TABLET | Freq: Two times a day (BID) | ORAL | 0 refills | Status: DC

## 2019-02-15 NOTE — Telephone Encounter (Signed)
Appt scheduled. Pt notified.

## 2019-02-15 NOTE — Progress Notes (Signed)
Subjective:    Patient ID: Preston Berry, male    DOB: Mar 19, 1986, 33 y.o.   MRN: [id number]   HPI: Preston Berry is a 33 y.o. male presenting for knife like pain lower left abd at side.Shoots all over stomach and chest. Sharp stabbing. Hurts to lay hand on the area. Onset over 1 week Constant. LGF subjective. CT scan of abd scheduled in 2 days. Wakening q AM with taste of blood in his mouth x 2-3 days. BMs once a day usually. Now 2-3 a day. Stools are greenish color. Formed. No blood in stool. Staying thirsty. On weight - loss diet. Eating twice a day. Eggs in AM, chicken in evening. Has lost 18 lbs in 2-3 weeks. Had spell of incontinence of stool last night in bath tub and in his underwear. This scares him a lot. Sx ongoing since before he was seen on  01/27/2019. Seen on 8/4 by GI. Colonoscopy ordered. No recent use of antibiotics.  Ms. Thayer Ohm note and GI note reviewed.   Depression screen [facility] 2/9 01/27/2019 01/13/2019 08/26/2018 01/19/2018 09/09/2017  Decreased Interest 0 0 2 3 0  Down, Depressed, Hopeless 1 0 2 3 0  PHQ - 2 Score 1 0 4 6 0  Altered sleeping - 3 3 3  -  Tired, decreased energy - 0 2 3 -  Change in appetite - 0 2 3 -  Feeling bad or failure about yourself  - 0 2 0 -  Trouble concentrating - - 1 3 -  Moving slowly or fidgety/restless - 0 2 0 -  Suicidal thoughts - 0 0 0 -  PHQ-9 Score - 3 16 18  -  Difficult doing work/chores - Not difficult at all - - -     Relevant past medical, surgical, family and social history reviewed and updated as indicated.  Interim medical history since our last visit reviewed. Allergies and medications reviewed and updated.  ROS:  Review of Systems  Constitutional: Negative.   HENT: Negative.   Eyes: Negative for visual disturbance.  Respiratory: Negative for cough and shortness of breath.   Cardiovascular: Negative for chest pain and leg swelling.  Gastrointestinal: Positive for abdominal pain. Negative for blood in stool, diarrhea,  nausea and vomiting.  Genitourinary: Negative for difficulty urinating.  Musculoskeletal: Negative for arthralgias and myalgias.  Skin: Negative for rash.  Neurological: Negative for headaches.  Psychiatric/Behavioral: Negative for sleep disturbance.     Social History   Tobacco Use  Smoking Status Never Smoker  Smokeless Tobacco Never Used       Objective:     Wt Readings from Last 3 Encounters:  02/08/19 212 lb 8 oz (96.4 kg)  01/27/19 212 lb (96.2 kg)  01/13/19 215 lb (97.5 kg)     Exam deferred. Pt. Harboring due to COVID 19. Phone visit performed.   Assessment & Plan:   1. Colitis   2. LLQ abdominal pain     Meds ordered this encounter  Medications  . metroNIDAZOLE (FLAGYL) 500 MG tablet    Sig: Take 1 tablet (500 mg total) by mouth 3 (three) times daily.    Dispense:  30 tablet    Refill:  0  . ciprofloxacin (CIPRO) 500 MG tablet    Sig: Take 1 tablet (500 mg total) by mouth 2 (two) times daily.    Dispense:  20 tablet    Refill:  0    No orders of the defined types were placed in this encounter.  Diagnoses and all orders for this visit:  Colitis  LLQ abdominal pain  Other orders -     metroNIDAZOLE (FLAGYL) 500 MG tablet; Take 1 tablet (500 mg total) by mouth 3 (three) times daily. -     ciprofloxacin (CIPRO) 500 MG tablet; Take 1 tablet (500 mg total) by mouth 2 (two) times daily.    Virtual Visit via telephone Note  I discussed the limitations, risks, security and privacy concerns of performing an evaluation and management service by telephone and the availability of in person appointments. The patient was identified with two identifiers. Pt.expressed understanding and agreed to proceed. Pt. Is at home. Dr. Livia Snellen is in his office.  Follow Up Instructions:   I discussed the assessment and treatment plan with the patient. The patient was provided an opportunity to ask questions and all were answered. The patient agreed with the plan and  demonstrated an understanding of the instructions.   The patient was advised to call back or seek an in-person evaluation if the symptoms worsen or if the condition fails to improve as anticipated.   Total minutes including chart review and phone contact time: 34   Follow up plan: Return in about 3 days (around 02/18/2019) for With Ms Rakes .  Claretta Fraise, MD [place]

## 2019-02-17 ENCOUNTER — Ambulatory Visit (HOSPITAL_COMMUNITY)
Admission: RE | Admit: 2019-02-17 | Discharge: 2019-02-17 | Disposition: A | Discharge status: Home / Self Care | Payer: Medicare Other | Source: Ambulatory Visit | Attending: Family Medicine | Admitting: Family Medicine

## 2019-02-17 ENCOUNTER — Encounter: Payer: Self-pay | Admitting: Family Medicine

## 2019-02-17 ENCOUNTER — Other Ambulatory Visit: Payer: Self-pay

## 2019-02-17 ENCOUNTER — Ambulatory Visit (INDEPENDENT_AMBULATORY_CARE_PROVIDER_SITE_OTHER): Payer: Medicare Other | Admitting: Family Medicine

## 2019-02-17 DIAGNOSIS — R1032 Left lower quadrant pain: Secondary | ICD-10-CM | POA: Diagnosis not present

## 2019-02-17 DIAGNOSIS — K921 Melena: Secondary | ICD-10-CM | POA: Diagnosis not present

## 2019-02-17 DIAGNOSIS — K760 Fatty (change of) liver, not elsewhere classified: Secondary | ICD-10-CM | POA: Insufficient documentation

## 2019-02-17 DIAGNOSIS — K529 Noninfective gastroenteritis and colitis, unspecified: Secondary | ICD-10-CM | POA: Diagnosis not present

## 2019-02-17 LAB — POCT I-STAT CREATININE: Creatinine, Ser: 0.9 mg/dL (ref 0.61–1.24)

## 2019-02-17 MED ORDER — IOHEXOL 300 MG/ML  SOLN
30.0000 mL | Freq: Once | INTRAMUSCULAR | Status: AC | PRN
  Administered 2019-02-17: 11:00:00 30 mL via ORAL

## 2019-02-17 MED ORDER — IOHEXOL 300 MG/ML  SOLN
100.0000 mL | Freq: Once | INTRAMUSCULAR | Status: AC | PRN
  Administered 2019-02-17: 100 mL via INTRAVENOUS
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
## 2019-02-20 ENCOUNTER — Other Ambulatory Visit: Payer: Self-pay

## 2019-02-20 ENCOUNTER — Emergency Department (HOSPITAL_COMMUNITY)
Admission: EM | Admit: 2019-02-20 | Discharge: 2019-02-20 | Disposition: A | Discharge status: Home / Self Care | Payer: Medicare Other | Attending: Emergency Medicine | Admitting: Emergency Medicine

## 2019-02-20 ENCOUNTER — Encounter (HOSPITAL_COMMUNITY): Payer: Self-pay | Admitting: Emergency Medicine

## 2019-02-20 ENCOUNTER — Emergency Department (HOSPITAL_COMMUNITY): Payer: Medicare Other

## 2019-02-20 DIAGNOSIS — R079 Chest pain, unspecified: Secondary | ICD-10-CM | POA: Diagnosis not present

## 2019-02-20 DIAGNOSIS — I10 Essential (primary) hypertension: Secondary | ICD-10-CM | POA: Diagnosis not present

## 2019-02-20 DIAGNOSIS — Z79899 Other long term (current) drug therapy: Secondary | ICD-10-CM | POA: Insufficient documentation

## 2019-02-20 DIAGNOSIS — R202 Paresthesia of skin: Secondary | ICD-10-CM | POA: Insufficient documentation

## 2019-02-20 DIAGNOSIS — R072 Precordial pain: Secondary | ICD-10-CM | POA: Diagnosis not present

## 2019-02-20 DIAGNOSIS — R112 Nausea with vomiting, unspecified: Secondary | ICD-10-CM | POA: Diagnosis not present

## 2019-02-20 HISTORY — DX: Fatty (change of) liver, not elsewhere classified: K76.0

## 2019-02-20 HISTORY — DX: Noninfective gastroenteritis and colitis, unspecified: K52.9

## 2019-02-20 LAB — BASIC METABOLIC PANEL
Anion gap: 9 (ref 5–15)
BUN: 11 mg/dL (ref 6–20)
CO2: 26 mmol/L (ref 22–32)
Calcium: 9.2 mg/dL (ref 8.9–10.3)
Chloride: 102 mmol/L (ref 98–111)
Creatinine, Ser: 0.82 mg/dL (ref 0.61–1.24)
GFR calc Af Amer: >60 mL/min (ref >60)
GFR calc non Af Amer: >60 mL/min (ref >60)
Glucose, Bld: 86 mg/dL (ref 70–99)
Potassium: 4 mmol/L (ref 3.5–5.1)
Sodium: 137 mmol/L (ref 135–145)

## 2019-02-20 LAB — CBC
HCT: 47.8 % (ref 39.0–52.0)
Hemoglobin: 15.9 g/dL (ref 13.0–17.0)
MCH: 29 pg (ref 26.0–34.0)
MCHC: 33.3 g/dL (ref 30.0–36.0)
MCV: 87.2 fL (ref 80.0–100.0)
Platelets: 326 10*3/uL (ref 150–400)
RBC: 5.48 MIL/uL (ref 4.22–5.81)
RDW: 11.9 % (ref 11.5–15.5)
WBC: 6.9 10*3/uL (ref 4.0–10.5)
nRBC: 0 % (ref 0.0–0.2)

## 2019-02-20 LAB — TROPONIN I (HIGH SENSITIVITY)
Troponin I (High Sensitivity): 4 ng/L (ref <18)
Troponin I (High Sensitivity): 4 ng/L (ref <18)

## 2019-02-20 LAB — D-DIMER, QUANTITATIVE: D-Dimer, Quant: <0.27 ug/mL-FEU (ref 0.00–0.50)

## 2019-02-20 MED ORDER — SODIUM CHLORIDE 0.9% FLUSH: 3.0000 mL | Freq: Once | INTRAVENOUS | Status: DC

## 2019-02-20 NOTE — ED Triage Notes (Signed)
Patient c/o left side chest pain that started last night. Patient called paramedic and was checked . Patient told that the only thing they could see was that his blood pressure and HR was elevated. Patient woke again this morning at 6am. Patient states started in left upper abd and radiated into left chest this morning. Pain worse with deep breath. Patient states nausea and vomiting. Patient recently diagnosed with "colon infection and fatty liver." Patient given two different antibiotics. Patient stopped taking medication last night because it would make him hot and "upset his stomach."

## 2019-02-20 NOTE — Discharge Instructions (Addendum)
Continue taking your blood pressure medications daily as directed.  Call your primary provider to arrange a follow-up appointment.  And keep your appointment with your cardiologist as previously scheduled.  Return to the ER for any worsening symptoms.
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
## 2019-02-21 ENCOUNTER — Telehealth: Payer: Self-pay | Admitting: Family Medicine

## 2019-02-21 NOTE — Telephone Encounter (Signed)
Tell him to make sure he is eating when taking the medications and avoiding alcohol. If the diarrhea continues, he needs to be reevaluated. Tell him to try taking a probiotic daily. Make sure staying hydrated and increase fiber in diet as well.

## 2019-02-21 NOTE — Telephone Encounter (Signed)
He should contact GI as he has proctitis. He was given these medications by Dr. Livia Snellen and is to follow up with GI. HE should reach out to GI to see what they would like to change this to for the proctitis.

## 2019-02-21 NOTE — Telephone Encounter (Signed)
03/22/19 is the first available with GI - hasn't seen yet.  He wants [place] to tell him what to do about the meds making him sick.

## 2019-02-21 NOTE — Telephone Encounter (Signed)
Please review and advise.

## 2019-02-21 NOTE — Telephone Encounter (Signed)
Aware of suggestions and will call if does not start feeling better.

## 2019-02-24 DIAGNOSIS — R079 Chest pain, unspecified: Secondary | ICD-10-CM | POA: Diagnosis not present

## 2019-02-24 DIAGNOSIS — Z79899 Other long term (current) drug therapy: Secondary | ICD-10-CM | POA: Diagnosis not present

## 2019-02-24 DIAGNOSIS — M79602 Pain in left arm: Secondary | ICD-10-CM | POA: Diagnosis not present

## 2019-02-24 DIAGNOSIS — Z8249 Family history of ischemic heart disease and other diseases of the circulatory system: Secondary | ICD-10-CM | POA: Diagnosis not present

## 2019-02-24 DIAGNOSIS — R110 Nausea: Secondary | ICD-10-CM | POA: Diagnosis not present

## 2019-02-24 DIAGNOSIS — I10 Essential (primary) hypertension: Secondary | ICD-10-CM | POA: Diagnosis not present

## 2019-02-24 DIAGNOSIS — R200 Anesthesia of skin: Secondary | ICD-10-CM | POA: Diagnosis not present

## 2019-02-24 DIAGNOSIS — E785 Hyperlipidemia, unspecified: Secondary | ICD-10-CM | POA: Diagnosis not present

## 2019-02-27 DIAGNOSIS — Z881 Allergy status to other antibiotic agents status: Secondary | ICD-10-CM | POA: Diagnosis not present

## 2019-02-27 DIAGNOSIS — F319 Bipolar disorder, unspecified: Secondary | ICD-10-CM | POA: Diagnosis not present

## 2019-02-27 DIAGNOSIS — Z79899 Other long term (current) drug therapy: Secondary | ICD-10-CM | POA: Diagnosis not present

## 2019-02-27 DIAGNOSIS — E7800 Pure hypercholesterolemia, unspecified: Secondary | ICD-10-CM | POA: Diagnosis not present

## 2019-02-27 DIAGNOSIS — Z209 Contact with and (suspected) exposure to unspecified communicable disease: Secondary | ICD-10-CM | POA: Diagnosis not present

## 2019-02-27 DIAGNOSIS — Z880 Allergy status to penicillin: Secondary | ICD-10-CM | POA: Diagnosis not present

## 2019-02-27 DIAGNOSIS — R0789 Other chest pain: Secondary | ICD-10-CM | POA: Diagnosis not present

## 2019-02-27 DIAGNOSIS — R202 Paresthesia of skin: Secondary | ICD-10-CM | POA: Diagnosis not present

## 2019-02-27 DIAGNOSIS — R58 Hemorrhage, not elsewhere classified: Secondary | ICD-10-CM | POA: Diagnosis not present

## 2019-02-27 DIAGNOSIS — R0602 Shortness of breath: Secondary | ICD-10-CM | POA: Diagnosis not present

## 2019-02-27 DIAGNOSIS — R079 Chest pain, unspecified: Secondary | ICD-10-CM | POA: Diagnosis not present

## 2019-02-27 DIAGNOSIS — K219 Gastro-esophageal reflux disease without esophagitis: Secondary | ICD-10-CM | POA: Diagnosis not present

## 2019-02-28 ENCOUNTER — Encounter: Payer: Self-pay | Admitting: Family Medicine

## 2019-02-28 ENCOUNTER — Ambulatory Visit (INDEPENDENT_AMBULATORY_CARE_PROVIDER_SITE_OTHER): Payer: Medicare Other | Admitting: Family Medicine

## 2019-02-28 ENCOUNTER — Other Ambulatory Visit: Payer: Self-pay

## 2019-02-28 DIAGNOSIS — M79602 Pain in left arm: Secondary | ICD-10-CM | POA: Diagnosis not present

## 2019-02-28 DIAGNOSIS — R0789 Other chest pain: Secondary | ICD-10-CM | POA: Diagnosis not present
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
## 2019-03-04 ENCOUNTER — Emergency Department (HOSPITAL_COMMUNITY)
Admission: EM | Admit: 2019-03-04 | Discharge: 2019-03-04 | Disposition: A | Discharge status: Home / Self Care | Payer: Medicare Other | Attending: Emergency Medicine | Admitting: Emergency Medicine

## 2019-03-04 ENCOUNTER — Encounter (HOSPITAL_COMMUNITY): Payer: Self-pay

## 2019-03-04 ENCOUNTER — Other Ambulatory Visit: Payer: Self-pay

## 2019-03-04 DIAGNOSIS — I10 Essential (primary) hypertension: Secondary | ICD-10-CM | POA: Diagnosis not present

## 2019-03-04 DIAGNOSIS — R0789 Other chest pain: Secondary | ICD-10-CM | POA: Insufficient documentation

## 2019-03-04 DIAGNOSIS — Z79899 Other long term (current) drug therapy: Secondary | ICD-10-CM | POA: Insufficient documentation

## 2019-03-04 LAB — COMPREHENSIVE METABOLIC PANEL
ALT: 40 U/L (ref 0–44)
AST: 21 U/L (ref 15–41)
Albumin: 4 g/dL (ref 3.5–5.0)
Alkaline Phosphatase: 73 U/L (ref 38–126)
Anion gap: 9 (ref 5–15)
BUN: 13 mg/dL (ref 6–20)
CO2: 25 mmol/L (ref 22–32)
Calcium: 9.1 mg/dL (ref 8.9–10.3)
Chloride: 105 mmol/L (ref 98–111)
Creatinine, Ser: 0.89 mg/dL (ref 0.61–1.24)
GFR calc Af Amer: >60 mL/min (ref >60)
GFR calc non Af Amer: >60 mL/min (ref >60)
Glucose, Bld: 98 mg/dL (ref 70–99)
Potassium: 3.7 mmol/L (ref 3.5–5.1)
Sodium: 139 mmol/L (ref 135–145)
Total Bilirubin: 0.3 mg/dL (ref 0.3–1.2)
Total Protein: 7.1 g/dL (ref 6.5–8.1)

## 2019-03-04 LAB — CBC
HCT: 43.2 % (ref 39.0–52.0)
Hemoglobin: 14.6 g/dL (ref 13.0–17.0)
MCH: 30.1 pg (ref 26.0–34.0)
MCHC: 33.8 g/dL (ref 30.0–36.0)
MCV: 89.1 fL (ref 80.0–100.0)
Platelets: 334 10*3/uL (ref 150–400)
RBC: 4.85 MIL/uL (ref 4.22–5.81)
RDW: 12 % (ref 11.5–15.5)
WBC: 6.1 10*3/uL (ref 4.0–10.5)
nRBC: 0 % (ref 0.0–0.2)

## 2019-03-04 LAB — TROPONIN I (HIGH SENSITIVITY): Troponin I (High Sensitivity): 5 ng/L (ref <18)

## 2019-03-04 LAB — LIPASE, BLOOD: Lipase: 28 U/L (ref 11–51)
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
## 2019-03-04 NOTE — ED Triage Notes (Signed)
Pt states he was evaluated last week for the pain he is having in his chest, states he has cardiology appt for next week.  Tonight the pain worsened with the numbness in his left arm and neck.

## 2019-03-09 DIAGNOSIS — Z881 Allergy status to other antibiotic agents status: Secondary | ICD-10-CM | POA: Diagnosis not present

## 2019-03-09 DIAGNOSIS — K219 Gastro-esophageal reflux disease without esophagitis: Secondary | ICD-10-CM | POA: Diagnosis not present

## 2019-03-09 DIAGNOSIS — F909 Attention-deficit hyperactivity disorder, unspecified type: Secondary | ICD-10-CM | POA: Diagnosis not present

## 2019-03-09 DIAGNOSIS — Z23 Encounter for immunization: Secondary | ICD-10-CM | POA: Diagnosis not present

## 2019-03-09 DIAGNOSIS — Z880 Allergy status to penicillin: Secondary | ICD-10-CM | POA: Diagnosis not present

## 2019-03-09 DIAGNOSIS — R072 Precordial pain: Secondary | ICD-10-CM | POA: Diagnosis not present

## 2019-03-09 DIAGNOSIS — I10 Essential (primary) hypertension: Secondary | ICD-10-CM | POA: Diagnosis not present

## 2019-03-09 DIAGNOSIS — R0789 Other chest pain: Secondary | ICD-10-CM | POA: Diagnosis not present

## 2019-03-09 DIAGNOSIS — F319 Bipolar disorder, unspecified: Secondary | ICD-10-CM | POA: Diagnosis not present

## 2019-03-09 DIAGNOSIS — M79602 Pain in left arm: Secondary | ICD-10-CM | POA: Diagnosis not present

## 2019-03-09 DIAGNOSIS — J45909 Unspecified asthma, uncomplicated: Secondary | ICD-10-CM | POA: Diagnosis not present

## 2019-03-09 DIAGNOSIS — E785 Hyperlipidemia, unspecified: Secondary | ICD-10-CM | POA: Diagnosis not present

## 2019-03-09 DIAGNOSIS — Z79899 Other long term (current) drug therapy: Secondary | ICD-10-CM | POA: Diagnosis not present

## 2019-03-09 DIAGNOSIS — F419 Anxiety disorder, unspecified: Secondary | ICD-10-CM | POA: Diagnosis not present

## 2019-03-09 DIAGNOSIS — R079 Chest pain, unspecified: Secondary | ICD-10-CM | POA: Diagnosis not present

## 2019-03-10 DIAGNOSIS — K219 Gastro-esophageal reflux disease without esophagitis: Secondary | ICD-10-CM | POA: Diagnosis not present

## 2019-03-10 DIAGNOSIS — I10 Essential (primary) hypertension: Secondary | ICD-10-CM | POA: Diagnosis not present

## 2019-03-10 DIAGNOSIS — R0789 Other chest pain: Secondary | ICD-10-CM | POA: Diagnosis not present

## 2019-03-10 DIAGNOSIS — E785 Hyperlipidemia, unspecified: Secondary | ICD-10-CM | POA: Diagnosis not present

## 2019-03-11 ENCOUNTER — Ambulatory Visit: Payer: Medicare Other | Admitting: Interventional Cardiology

## 2019-03-15 DIAGNOSIS — R079 Chest pain, unspecified: Secondary | ICD-10-CM | POA: Diagnosis not present

## 2019-03-17 ENCOUNTER — Ambulatory Visit (INDEPENDENT_AMBULATORY_CARE_PROVIDER_SITE_OTHER): Payer: Medicare Other | Admitting: Interventional Cardiology

## 2019-03-17 ENCOUNTER — Encounter: Payer: Self-pay | Admitting: Interventional Cardiology

## 2019-03-17 ENCOUNTER — Other Ambulatory Visit: Payer: Self-pay

## 2019-03-17 VITALS — BP 110/80 | HR 86 | Ht 68.0 in | Wt 215.2 lb

## 2019-03-17 DIAGNOSIS — R1032 Left lower quadrant pain: Secondary | ICD-10-CM | POA: Diagnosis not present

## 2019-03-17 DIAGNOSIS — R072 Precordial pain: Secondary | ICD-10-CM

## 2019-03-17 DIAGNOSIS — E781 Pure hyperglyceridemia: Secondary | ICD-10-CM | POA: Diagnosis not present
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
## 2019-03-17 NOTE — Patient Instructions (Signed)
Medication Instructions:  Your physician recommends that you continue on your current medications as directed. Please refer to the Current Medication list given to you today.  If you need a refill on your cardiac medications before your next appointment, please call your pharmacy.   Lab work: none If you have labs (blood work) drawn today and your tests are completely normal, you will receive your results only by: Marland Kitchen MyChart Message (if you have MyChart) OR . A paper copy in the mail If you have any lab test that is abnormal or we need to change your treatment, we will call you to review the results.  Testing/Procedures: none  Follow-Up: . Follow up as needed  Any Other Special Instructions Will Be Listed Below (If Applicable).

## 2019-03-21 ENCOUNTER — Encounter (HOSPITAL_COMMUNITY): Payer: Self-pay | Admitting: Emergency Medicine

## 2019-03-21 ENCOUNTER — Emergency Department (HOSPITAL_COMMUNITY): Payer: Medicare Other

## 2019-03-21 ENCOUNTER — Emergency Department (HOSPITAL_COMMUNITY)
Admission: EM | Admit: 2019-03-21 | Discharge: 2019-03-21 | Disposition: A | Discharge status: Home / Self Care | Payer: Medicare Other | Attending: Emergency Medicine | Admitting: Emergency Medicine

## 2019-03-21 ENCOUNTER — Other Ambulatory Visit: Payer: Self-pay

## 2019-03-21 DIAGNOSIS — Z79899 Other long term (current) drug therapy: Secondary | ICD-10-CM | POA: Insufficient documentation

## 2019-03-21 DIAGNOSIS — M25572 Pain in left ankle and joints of left foot: Secondary | ICD-10-CM | POA: Insufficient documentation

## 2019-03-21 DIAGNOSIS — R0789 Other chest pain: Secondary | ICD-10-CM | POA: Diagnosis not present

## 2019-03-21 DIAGNOSIS — R079 Chest pain, unspecified: Secondary | ICD-10-CM | POA: Diagnosis not present

## 2019-03-21 DIAGNOSIS — M25512 Pain in left shoulder: Secondary | ICD-10-CM | POA: Diagnosis not present

## 2019-03-21 DIAGNOSIS — I10 Essential (primary) hypertension: Secondary | ICD-10-CM | POA: Diagnosis not present

## 2019-03-21 LAB — CBC
HCT: 44.1 % (ref 39.0–52.0)
Hemoglobin: 14.6 g/dL (ref 13.0–17.0)
MCH: 29.7 pg (ref 26.0–34.0)
MCHC: 33.1 g/dL (ref 30.0–36.0)
MCV: 89.8 fL (ref 80.0–100.0)
Platelets: 304 10*3/uL (ref 150–400)
RBC: 4.91 MIL/uL (ref 4.22–5.81)
RDW: 12.2 % (ref 11.5–15.5)
WBC: 7 10*3/uL (ref 4.0–10.5)
nRBC: 0 % (ref 0.0–0.2)

## 2019-03-21 LAB — BASIC METABOLIC PANEL
Anion gap: 10 (ref 5–15)
BUN: 13 mg/dL (ref 6–20)
CO2: 25 mmol/L (ref 22–32)
Calcium: 9.1 mg/dL (ref 8.9–10.3)
Chloride: 103 mmol/L (ref 98–111)
Creatinine, Ser: 0.76 mg/dL (ref 0.61–1.24)
GFR calc Af Amer: >60 mL/min (ref >60)
GFR calc non Af Amer: >60 mL/min (ref >60)
Glucose, Bld: 105 mg/dL — ABNORMAL HIGH (ref 70–99)
Potassium: 3.9 mmol/L (ref 3.5–5.1)
Sodium: 138 mmol/L (ref 135–145)

## 2019-03-21 LAB — TROPONIN I (HIGH SENSITIVITY)
Troponin I (High Sensitivity): 5 ng/L (ref <18)
Troponin I (High Sensitivity): 5 ng/L (ref <18)

## 2019-03-21 MED ORDER — METHOCARBAMOL 500 MG PO TABS
500.0000 mg | ORAL_TABLET | Freq: Once | ORAL | Status: AC
  Administered 2019-03-21: 500 mg via ORAL
  Filled 2019-03-21: qty 1

## 2019-03-21 MED ORDER — METHOCARBAMOL 500 MG PO TABS: ORAL_TABLET | ORAL | 0 refills | Status: DC

## 2019-03-21 MED ORDER — IBUPROFEN 400 MG PO TABS
600.0000 mg | ORAL_TABLET | Freq: Once | ORAL | Status: AC
  Administered 2019-03-21: 04:00:00 600 mg via ORAL
  Filled 2019-03-21: qty 2

## 2019-03-21 NOTE — ED Provider Notes (Signed)
[facility] EMERGENCY DEPARTMENT Provider Note   CSN: [id number] Arrival date & time: 03/21/19  0315   Time seen 3:55 AM (patient was in radiology at 3:40 AM)  History   Chief Complaint Chief Complaint  Patient presents with   Chest Pain    HPI Preston Berry is a 33 y.o. male.     HPI this is the patient's third ED visit since August 16 for chest pain.  He was seen by Dr. Irish Lack, Cardioloogy on  September 10 who felt he was having stress related chest pain.  Patient had a normal CT coronary morph of the chest in June 2020 with coronary artery calcium score of 0.  He felt patient did not need any further cardiac evaluation.  Patient states about 3 AM he was awakened from sleep with a stabbing pain in his left chest.  He states he felt like his heart rate was different and later on states he felt like his heart was beating too slowly.  He states when he had the chest pain it made him feel short of breath.  He states the chest pain last 1 to 2 minutes and it comes and goes.  He also complains of pain in his left shoulder and in his left arm.  He also states he has some pain in his left ankle.  He states when he has the pain he "takes blood" in my mouth.  He states this is like the pain he has had before that started about a month ago.  He denies cough, fever, or rhinorrhea, he also denies swelling of his legs.  When asked if he is under any extra stress he states he is.  His mother died a year ago and his father is needing to move out of his trailer where all the children grew up and his mother died.  He states his father is not doing well and is going to move in with the patient's sister.  The sister who normally takes care of his father left at 6 PM tonight and she will be returning in another couple of days.  The patient is staying with his father who has breathing problems.  He seems very upset about losing the family home.  Patient states he lives in a low income apartment in [place] with his  fiance.  PCP Baruch Gouty, FNP   Past Medical History:  Diagnosis Date   Abdominal pain 02/19/2015   Abnormal EKG 01/27/2019   Arthritis    Bipolar disorder ([place]) 04/10/2014   Chest pain, rule out acute myocardial infarction 12/28/2018   Colitis    Constipation 02/08/2019   Depression    Eczema 09/18/2015   Essential hypertension 01/13/2019   Fatty liver    GERD (gastroesophageal reflux disease)    Gynecomastia    H/O lead exposure 02/19/2015   Hepatic steatosis    Hepatomegaly    Hyperlipidemia    Hypertension    LLQ abdominal pain 02/08/2019   Panic attacks 04/10/2014   Rectal bleeding 02/08/2019   Transaminitis 02/19/2015    Patient Active Problem List   Diagnosis Date Noted   Hepatic steatosis 02/17/2019   Rectal bleeding 02/08/2019   LLQ abdominal pain 02/08/2019   Constipation 02/08/2019   Abnormal EKG 01/27/2019   Essential hypertension 01/13/2019   Chest pain, rule out acute myocardial infarction 12/28/2018   Eczema 09/18/2015   Gynecomastia 03/08/2015   Abdominal pain 02/19/2015   H/O lead exposure 02/19/2015   Transaminitis 02/19/2015  Hyperlipidemia 09/19/2014   Depression 09/19/2014   Bipolar disorder ([place]) 04/10/2014   Panic attacks 04/10/2014   GERD (gastroesophageal reflux disease) 03/29/2014    Past Surgical History:  Procedure Laterality Date   DEBRIDEMENT AND CLOSURE WOUND     OTHER SURGICAL HISTORY     surgery to remove glass from peritoneal and buttox area d/t sled accident        Home Medications    Prior to Admission medications   Medication Sig Start Date End Date Taking? Authorizing Provider  atorvastatin (LIPITOR) 20 MG tablet Take 20 mg by mouth daily.   Yes [provider]  ibuprofen (ADVIL) 200 MG tablet Take 200 mg by mouth every 6 (six) hours as needed for headache.   Yes [provider]  lisinopril (ZESTRIL) 5 MG tablet Take 5 mg by mouth daily.   Yes [provider]  pantoprazole (PROTONIX) 40 MG tablet Take 1 tablet (40 mg total) by mouth daily. 01/13/19  Yes Rakes, Connye Burkitt, FNP  methocarbamol (ROBAXIN) 500 MG tablet Take 1 or 2 po Q 6hrs for muscle pain 03/21/19   Rolland Porter, MD    Family History Family History  Problem Relation Age of Onset   Diabetes Mellitus II Mother    Diabetes Father    Alcohol abuse Father    Stroke Father    Heart attack Father        In his 43's    Social History Social History   Tobacco Use   Smoking status: Never Smoker   Smokeless tobacco: Never Used  Substance Use Topics   Alcohol use: No    Alcohol/week: 0.0 standard drinks    Frequency: Never   Drug use: No     Allergies   Penicillins and Keflex [cephalexin]   Review of Systems Review of Systems  All other systems reviewed and are negative.    Physical Exam Updated Vital Signs BP 116/73    Pulse (!) 47    Temp 98.3 F (36.8 C) (Oral)    Resp 19    Ht 5\' 8"  (1.727 m)    Wt 97.6 kg    SpO2 97%    BMI 32.72 kg/m   Physical Exam Vitals signs and nursing note reviewed.  Constitutional:      General: He is not in acute distress.    Appearance: Normal appearance. He is well-developed. He is not ill-appearing or toxic-appearing.  HENT:     Head: Normocephalic and atraumatic.     Right Ear: External ear normal.     Left Ear: External ear normal.     Nose: Nose normal. No mucosal edema or rhinorrhea.     Mouth/Throat:     Mouth: Mucous membranes are moist.     Dentition: No dental abscesses.     Pharynx: No oropharyngeal exudate, posterior oropharyngeal erythema or uvula swelling.  Eyes:     Extraocular Movements: Extraocular movements intact.     Conjunctiva/sclera: Conjunctivae normal.     Pupils: Pupils are equal, round, and reactive to light.  Neck:     Musculoskeletal: Full passive range of motion without pain, normal range of motion and neck supple.  Cardiovascular:     Rate and Rhythm: Normal rate and regular rhythm.      Heart sounds: Normal heart sounds. No murmur. No friction rub. No gallop.   Pulmonary:     Effort: Pulmonary effort is normal. No respiratory distress.     Breath sounds: Normal breath sounds.  No wheezing, rhonchi or rales.  Chest:     Chest wall: Tenderness present. No crepitus.    Abdominal:     General: Bowel sounds are normal. There is no distension.     Palpations: Abdomen is soft.     Tenderness: There is no abdominal tenderness. There is no guarding or rebound.  Musculoskeletal: Normal range of motion.        General: No tenderness.     Comments: Moves all extremities well.   Skin:    General: Skin is warm and dry.     Coloration: Skin is not pale.     Findings: No erythema or rash.  Neurological:     Mental Status: He is alert and oriented to person, place, and time.     Cranial Nerves: No cranial nerve deficit.  Psychiatric:        Mood and Affect: Mood is not anxious. Affect is flat.        Speech: Speech is rapid and pressured.        Behavior: Behavior normal.      ED Treatments / Results  Labs (all labs ordered are listed, but only abnormal results are displayed) Results for orders placed or performed during the hospital encounter of [id number]  Basic metabolic panel  Result Value Ref Range   Sodium 138 135 - 145 mmol/L   Potassium 3.9 3.5 - 5.1 mmol/L   Chloride 103 98 - 111 mmol/L   CO2 25 22 - 32 mmol/L   Glucose, Bld 105 (H) 70 - 99 mg/dL   BUN 13 6 - 20 mg/dL   Creatinine, Ser 0.76 0.61 - 1.24 mg/dL   Calcium 9.1 8.9 - 10.3 mg/dL   GFR calc non Af Amer >60 >60 mL/min   GFR calc Af Amer >60 >60 mL/min   Anion gap 10 5 - 15  CBC  Result Value Ref Range   WBC 7.0 4.0 - 10.5 K/uL   RBC 4.91 4.22 - 5.81 MIL/uL   Hemoglobin 14.6 13.0 - 17.0 g/dL   HCT 44.1 39.0 - 52.0 %   MCV 89.8 80.0 - 100.0 fL   MCH 29.7 26.0 - 34.0 pg   MCHC 33.1 30.0 - 36.0 g/dL   RDW 12.2 11.5 - 15.5 %   Platelets 304 150 - 400 K/uL   nRBC 0.0 0.0 - 0.2 %  Troponin I (High  Sensitivity)  Result Value Ref Range   Troponin I (High Sensitivity) 5 <18 ng/L  Troponin I (High Sensitivity)  Result Value Ref Range   Troponin I (High Sensitivity) 5 <18 ng/L      EKG EKG Interpretation  Date/Time:  Monday March 21 2019 03:31:06 EDT Ventricular Rate:  55 PR Interval:    QRS Duration: 107 QT Interval:  429 QTC Calculation: 411 R Axis:   -42 Text Interpretation:  Sinus rhythm Incomplete left bundle branch block ST elevation, consider inferior injury No significant change since last tracing 04 Mar 2019 Confirmed by Rolland Porter [id number]) on 03/21/2019 3:48:01 AM   Radiology Dg Chest 2 View  Result Date: 03/21/2019 CLINICAL DATA:  Chest pain EXAM: CHEST - 2 VIEW COMPARISON:  03/09/2019 FINDINGS: Cardiac shadows within normal limits. The lungs are well aerated bilaterally. No focal infiltrate or sizable effusion is seen. No bony abnormality is noted. IMPRESSION: No active cardiopulmonary disease. Electronically Signed   By: Inez Catalina M.D.   On: 03/21/2019 03:56    Procedures Procedures (including critical care time)   Exercise  tolerance test January 14, 2016  Notes Recorded by Almyra Deforest, PA on 01/14/2016 at 5:34 PM  Treadmill test did not show any obvious ischemia, plan to observe symptom for now.  Echocardiogram January 01, 2016 Study Conclusions  - Left ventricle: The cavity size was normal. Wall thickness was   increased in a pattern of mild LVH. Systolic function was   vigorous. The estimated ejection fraction was in the range of 65%   to 70%. Wall motion was normal; there were no regional wall   motion abnormalities. Left ventricular diastolic function   parameters were normal. - Mitral valve: Mildly thickened leaflets . There was trivial   regurgitation. - Left atrium: The atrium was normal in size. - Tricuspid valve: There was no significant regurgitation. - Inferior vena cava: The vessel was normal in size. The   respirophasic diameter changes  were in the normal range (>= 50%),   consistent with normal central venous pressure.  Impressions:  - LVEF 65-70%, mild LVH, normal wall motion, normal diastolic   function, normal LA size, trivial MR, no TR, normal IVC.   Medications Ordered in ED Medications  ibuprofen (ADVIL) tablet 600 mg (600 mg Oral Given 03/21/19 0416)  methocarbamol (ROBAXIN) tablet 500 mg (500 mg Oral Given 03/21/19 0416)     Initial Impression / Assessment and Plan / ED Course  I have reviewed the triage vital signs and the nursing notes.  Pertinent labs & imaging results that were available during my care of the patient were reviewed by me and considered in my medical decision making (see chart for details).   Patient was given ibuprofen for pain.  Laboratory testing was done.  Recheck at 5:15 AM patient states his pain is better, he still gets sharp pain every now and again in his chest.  He states the tingling in his arm is gone and his left shoulder feels better.  We discussed that he was waiting on the delta troponin.  Recheck at 6:20 AM patient states his pain continues to get better.  His arm pain is also almost gone.  We discussed his delta troponin is negative.  We also talked about stress and how it can affect the chest wall.  He was advised to use ice and heat for comfort, take ibuprofen.  I will give him a prescription for some mild muscle relaxers.  Final Clinical Impressions(s) / ED Diagnoses   Final diagnoses:  Atypical chest pain    ED Discharge Orders         Ordered    methocarbamol (ROBAXIN) 500 MG tablet     03/21/19 [id number]        OTC ibuprofen  Plan discharge  Rolland Porter, MD, Barbette Or, MD 03/21/19 [phone]

## 2019-03-21 NOTE — Discharge Instructions (Addendum)
Use ice and heat on the area that hurts for comfort.  Take ibuprofen 600 mg every 6 hours as needed for pain.  You can also take it with the muscle relaxer.  Be aware of when you are stressed out and try to relax your muscles.

## 2019-03-21 NOTE — ED Triage Notes (Signed)
Pt states he was awakened from his sleep approximately 20 mins ago with "stabbing" chest pain that radiated to L shoulder. Pt also states he was having some shortness of breath at that time as well. Pt took 325mg  Aspirin PTA.

## 2019-03-22 ENCOUNTER — Encounter (INDEPENDENT_AMBULATORY_CARE_PROVIDER_SITE_OTHER): Payer: Self-pay | Admitting: *Deleted

## 2019-03-22 ENCOUNTER — Ambulatory Visit (INDEPENDENT_AMBULATORY_CARE_PROVIDER_SITE_OTHER): Payer: Medicare Other | Admitting: Nurse Practitioner

## 2019-03-22 ENCOUNTER — Encounter (INDEPENDENT_AMBULATORY_CARE_PROVIDER_SITE_OTHER): Payer: Self-pay | Admitting: Nurse Practitioner

## 2019-03-22 ENCOUNTER — Telehealth (INDEPENDENT_AMBULATORY_CARE_PROVIDER_SITE_OTHER): Payer: Self-pay | Admitting: *Deleted

## 2019-03-22 ENCOUNTER — Other Ambulatory Visit: Payer: Self-pay

## 2019-03-22 VITALS — BP 119/70 | HR 80 | Temp 98.7°F | Ht 68.0 in | Wt 210.6 lb

## 2019-03-22 DIAGNOSIS — K760 Fatty (change of) liver, not elsewhere classified: Secondary | ICD-10-CM | POA: Diagnosis not present

## 2019-03-22 DIAGNOSIS — R1032 Left lower quadrant pain: Secondary | ICD-10-CM

## 2019-03-22 DIAGNOSIS — K219 Gastro-esophageal reflux disease without esophagitis: Secondary | ICD-10-CM | POA: Diagnosis not present

## 2019-03-22 DIAGNOSIS — K625 Hemorrhage of anus and rectum: Secondary | ICD-10-CM

## 2019-03-22 MED ORDER — PEG 3350-KCL-NA BICARB-NACL 420 G PO SOLR: 4000.0000 mL | Freq: Once | ORAL | 0 refills | Status: AC

## 2019-03-22 NOTE — Progress Notes (Addendum)
Subjective:    Patient ID: Preston Berry, male    DOB: Mar 09, 1986, 33 y.o.   MRN: [id number]  HPI Preston Berry is a 33 year old male with a past medical history of hypertension, bipolar disorder, arthritis, fatty liver, hepatomegaly, GERD and a traumatic rectal trauma secondary to a sledding injury.  He presents today to schedule an EGD and colonoscopy as previously recommended after he completed a cardiology consultation. Refer to office visit 02/08/2019. He was evaluated by cardiologist Dr. Irish Lack on 03/17/2019.  A 12-lead EKG demonstrated normal sinus rhythm with borderline inferior ST changes which were unchanged. He underwent a negative stress test in 2017.  Normal CTA June 2020.  Dr. Dominica Severin did not assess his chest pain to be cardiac origin.  He was cleared to proceed with a colonoscopy.  He developed chest pain that radiated to his left shoulder with SOB on 03/21/2019. He presented to [facility] ED.  This was his 3rd visit to the ED since 02/2019 with chest pain.  He received ibuprofen for pain and his symptoms improved.  He was discharged home.  He reports having an elevated stress level, his mother died one year ago and his father is not doing well and is currently at [facility] with respiratory distress with hemoptysis.   Labs 03/21/2019: Sodium 138.  Potassium 3.9.  Glucose 105.  Creatinine 0.76.  Troponin V.  WBC 7.0.  Hemoglobin 14.6.  Hematocrit 44.1.  Platelet 304.  Currently, he complains of having daily heartburn.  He increased Protonix 40 mg from once daily to twice daily without significant improvement.  He feels a lump type sensation in his throat which she somewhat attributes to having postnasal drainage.  He feels as if food gets stuck in the throat then passes after he drinks water, this occurs intermittently.He is passing a darker green to brown formed bowel movement every day or every other day.  Infrequent spotting of red blood on the toilet tissue.  He continues to have daily left  lower quadrant abdominal pain that sometimes lingers and moves up to the left upper quadrant then towards his bellybutton area.  His abdominal pain often interferes with his sleep.  No fever, sweats or chills.  No weight loss. No family history of colorectal cancer. Maternal uncle with history of stomach cancer.  Past Medical History:  Diagnosis Date   Abdominal pain 02/19/2015   Abnormal EKG 01/27/2019   Arthritis    Bipolar disorder ([place]) 04/10/2014   Chest pain, rule out acute myocardial infarction 12/28/2018   Colitis    Constipation 02/08/2019   Depression    Eczema 09/18/2015   Essential hypertension 01/13/2019   Fatty liver    GERD (gastroesophageal reflux disease)    Gynecomastia    H/O lead exposure 02/19/2015   Hepatic steatosis    Hepatomegaly    Hyperlipidemia    Hypertension    LLQ abdominal pain 02/08/2019   Panic attacks 04/10/2014   Rectal bleeding 02/08/2019   Transaminitis 02/19/2015   Past Surgical History:  Procedure Laterality Date   DEBRIDEMENT AND CLOSURE WOUND     OTHER SURGICAL HISTORY     surgery to remove glass from peritoneal and buttox area d/t sled accident   Current Outpatient Medications on File Prior to Visit  Medication Sig Dispense Refill   atorvastatin (LIPITOR) 20 MG tablet Take 20 mg by mouth daily.     ibuprofen (ADVIL) 200 MG tablet Take 200 mg by mouth every 6 (six) hours  as needed for headache.     lisinopril (ZESTRIL) 5 MG tablet Take 5 mg by mouth daily.     pantoprazole (PROTONIX) 40 MG tablet Take 1 tablet (40 mg total) by mouth daily. 90 tablet 3   No current facility-administered medications on file prior to visit.    Allergies  Allergen Reactions   Penicillins Anaphylaxis and Swelling    Did it involve swelling of the face/tongue/throat, SOB, or low BP? Yes Did it involve sudden or severe rash/hives, skin peeling, or any reaction on the inside of your mouth or nose? No Did you need to seek medical attention  at a hospital or doctor's office? unknown When did it last happen?over 15 years If all above answers are NO, may proceed with cephalosporin use.    Keflex [Cephalexin]     Patient states he is allergic to cephlasporins. Unknown reaction   Review of Systems see HPI, all other systems reviewed and are negative     Objective:   Physical Exam  BP 119/70    Pulse 80    Temp 98.7 F (37.1 C)    Ht 5\' 8"  (1.727 m)    Wt 210 lb 9.6 oz (95.5 kg)    BMI 32.02 kg/m ' General: 33 year old male in no acute distress Eyes: Sclera nonicteric, conjunctiva pink Mouth: Dentition intact, no ulcers or lesions Neck: Supple, no lymphadenopathy or thyromegaly Heart: Regular rate and rhythm, no murmurs Lungs: Breath sounds clear throughout Abdomen: Mild left lower quadrant tenderness without rebound or guarding, positive bowel sounds to all 4 quadrants, no HSM Extremities: No edema Neuro: Alert and oriented x4, no focal deficits     Assessment & Plan:   1.  Left lower quadrant abdominal pain -Schedule colonoscopy, benefits and risk discussed including risk with sedation, risk of bleeding, perforation infection -Patient to call office if his lower abdominal pain worsens or fever develops  2.  Rectal bleeding -proceed with a diagnostic colonoscopy   3.  Atypical non-cardiac chest pain, cardiac clearance for endoscopic evaluation provided by Dr. Irish Lack on 03/17/2019 -Schedule EGD -Protonix 40 mg p.o. twice daily  4.  Fatty liver -Discussed avoiding greasy, fried foods, avoid weight gain, exercise  5. HTN  Further follow-up to be determined after EGD and colonoscopy completed

## 2019-03-22 NOTE — Patient Instructions (Signed)
1. Schedule EGD and colonoscopy   2. Continue Protonix 40mg  one capsule by mouth twice daily   3. Further follow up to be determined after EGD and colonoscopy completed

## 2019-03-22 NOTE — Telephone Encounter (Signed)
Patient needs trilyte TCS/EGD sch'd 10/16

## 2019-03-25 ENCOUNTER — Ambulatory Visit (INDEPENDENT_AMBULATORY_CARE_PROVIDER_SITE_OTHER): Payer: Medicare Other | Admitting: Family Medicine

## 2019-03-25 ENCOUNTER — Telehealth: Payer: Self-pay | Admitting: Family Medicine

## 2019-03-25 ENCOUNTER — Encounter: Payer: Self-pay | Admitting: Family Medicine

## 2019-03-25 DIAGNOSIS — J069 Acute upper respiratory infection, unspecified: Secondary | ICD-10-CM | POA: Diagnosis not present

## 2019-03-25 DIAGNOSIS — R093 Abnormal sputum: Secondary | ICD-10-CM

## 2019-03-25 MED ORDER — GUAIFENESIN ER 600 MG PO TB12: 600.0000 mg | ORAL_TABLET | Freq: Two times a day (BID) | ORAL | 0 refills | Status: DC
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
## 2019-03-25 NOTE — Telephone Encounter (Signed)
I discussed the Mucinex with him on the phone and he was agreeable. If he wants testing, he needs to go to ED or UC due to symptoms.

## 2019-03-25 NOTE — Telephone Encounter (Signed)
Patient aware that this office can not bring anyone in with any COVID symptoms.  Patient states that he does not have COVID. Advised patient that he can be seen at any [facility] Urgent care or ED for further evaluations.

## 2019-03-27 DIAGNOSIS — R0982 Postnasal drip: Secondary | ICD-10-CM | POA: Diagnosis not present

## 2019-03-28 DIAGNOSIS — Z79899 Other long term (current) drug therapy: Secondary | ICD-10-CM | POA: Diagnosis not present

## 2019-03-28 DIAGNOSIS — F319 Bipolar disorder, unspecified: Secondary | ICD-10-CM | POA: Diagnosis not present

## 2019-03-28 DIAGNOSIS — Z20828 Contact with and (suspected) exposure to other viral communicable diseases: Secondary | ICD-10-CM | POA: Diagnosis not present

## 2019-03-28 DIAGNOSIS — Z880 Allergy status to penicillin: Secondary | ICD-10-CM | POA: Diagnosis not present

## 2019-03-28 DIAGNOSIS — R093 Abnormal sputum: Secondary | ICD-10-CM | POA: Diagnosis not present

## 2019-03-28 DIAGNOSIS — I10 Essential (primary) hypertension: Secondary | ICD-10-CM | POA: Diagnosis not present

## 2019-03-28 DIAGNOSIS — R0982 Postnasal drip: Secondary | ICD-10-CM | POA: Diagnosis not present

## 2019-03-28 DIAGNOSIS — E7800 Pure hypercholesterolemia, unspecified: Secondary | ICD-10-CM | POA: Diagnosis not present

## 2019-03-28 DIAGNOSIS — K219 Gastro-esophageal reflux disease without esophagitis: Secondary | ICD-10-CM | POA: Diagnosis not present

## 2019-03-29 DIAGNOSIS — B349 Viral infection, unspecified: Secondary | ICD-10-CM | POA: Diagnosis not present

## 2019-03-31 ENCOUNTER — Other Ambulatory Visit: Payer: Self-pay

## 2019-04-01 ENCOUNTER — Telehealth: Payer: Self-pay | Admitting: Family Medicine

## 2019-04-01 ENCOUNTER — Encounter: Payer: Self-pay | Admitting: Family Medicine

## 2019-04-01 ENCOUNTER — Ambulatory Visit (INDEPENDENT_AMBULATORY_CARE_PROVIDER_SITE_OTHER): Payer: Medicare Other | Admitting: Family Medicine

## 2019-04-01 DIAGNOSIS — J069 Acute upper respiratory infection, unspecified: Secondary | ICD-10-CM | POA: Diagnosis not present

## 2019-04-01 MED ORDER — BENZONATATE 100 MG PO CAPS: 100.0000 mg | ORAL_CAPSULE | Freq: Three times a day (TID) | ORAL | 0 refills | Status: DC | PRN

## 2019-04-01 MED ORDER — CETIRIZINE HCL 10 MG PO TABS: 10.0000 mg | ORAL_TABLET | Freq: Every day | ORAL | 11 refills | Status: DC

## 2019-04-01 MED ORDER — DOXYCYCLINE HYCLATE 100 MG PO TABS: 100.0000 mg | ORAL_TABLET | Freq: Two times a day (BID) | ORAL | 0 refills | Status: AC
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
## 2019-04-01 NOTE — Telephone Encounter (Signed)
He can try Robitussin DM. All of the over the counter meds will not be covered.

## 2019-04-01 NOTE — Telephone Encounter (Signed)
Patient aware.

## 2019-04-05 ENCOUNTER — Other Ambulatory Visit (INDEPENDENT_AMBULATORY_CARE_PROVIDER_SITE_OTHER): Payer: Self-pay | Admitting: Nurse Practitioner

## 2019-04-05 ENCOUNTER — Telehealth (INDEPENDENT_AMBULATORY_CARE_PROVIDER_SITE_OTHER): Payer: Self-pay | Admitting: *Deleted

## 2019-04-05 DIAGNOSIS — I10 Essential (primary) hypertension: Secondary | ICD-10-CM | POA: Diagnosis not present

## 2019-04-05 DIAGNOSIS — R109 Unspecified abdominal pain: Secondary | ICD-10-CM

## 2019-04-05 DIAGNOSIS — Z7982 Long term (current) use of aspirin: Secondary | ICD-10-CM | POA: Diagnosis not present

## 2019-04-05 DIAGNOSIS — R1032 Left lower quadrant pain: Secondary | ICD-10-CM

## 2019-04-05 DIAGNOSIS — Z880 Allergy status to penicillin: Secondary | ICD-10-CM | POA: Diagnosis not present

## 2019-04-05 DIAGNOSIS — E7800 Pure hypercholesterolemia, unspecified: Secondary | ICD-10-CM | POA: Diagnosis not present

## 2019-04-05 DIAGNOSIS — F319 Bipolar disorder, unspecified: Secondary | ICD-10-CM | POA: Diagnosis not present

## 2019-04-05 DIAGNOSIS — Z79899 Other long term (current) drug therapy: Secondary | ICD-10-CM | POA: Diagnosis not present

## 2019-04-05 DIAGNOSIS — R112 Nausea with vomiting, unspecified: Secondary | ICD-10-CM | POA: Diagnosis not present

## 2019-04-05 NOTE — Telephone Encounter (Signed)
I called pt, he stated he's having the same abd pain he's had for the past 2 years but it is lasting longer and is more frequent. He vomited once last night after eating chicken. He is urinating multiple times during the day, unlikely dehydrated. I advised pt to have labs repeated, he will stop by office to pick up lab order CBC, CMP and CRP. I advised pt to go to the local ER if he is having severe abd pain. EGD and colonoscopy are scheduled in 2 weeks. Await lab results, he may require repeat abd/pelvic CT. He will need office appt after labs done.

## 2019-04-05 NOTE — Telephone Encounter (Signed)
Patient is sch'd for [facility]CS 04/22/19, called in today- he is having more issues on the left side, having problem staying hyrated, getting tired for no reason - wants to know what to do -- please call [phone]

## 2019-04-07 ENCOUNTER — Telehealth (INDEPENDENT_AMBULATORY_CARE_PROVIDER_SITE_OTHER): Payer: Self-pay | Admitting: Nurse Practitioner

## 2019-04-07 ENCOUNTER — Telehealth: Payer: Self-pay | Admitting: Family Medicine

## 2019-04-07 DIAGNOSIS — Z880 Allergy status to penicillin: Secondary | ICD-10-CM | POA: Diagnosis not present

## 2019-04-07 DIAGNOSIS — K219 Gastro-esophageal reflux disease without esophagitis: Secondary | ICD-10-CM | POA: Diagnosis not present

## 2019-04-07 DIAGNOSIS — R0789 Other chest pain: Secondary | ICD-10-CM | POA: Diagnosis not present

## 2019-04-07 DIAGNOSIS — I10 Essential (primary) hypertension: Secondary | ICD-10-CM | POA: Diagnosis not present

## 2019-04-07 DIAGNOSIS — R1032 Left lower quadrant pain: Secondary | ICD-10-CM | POA: Diagnosis not present

## 2019-04-07 DIAGNOSIS — F319 Bipolar disorder, unspecified: Secondary | ICD-10-CM | POA: Diagnosis not present

## 2019-04-07 DIAGNOSIS — Z79899 Other long term (current) drug therapy: Secondary | ICD-10-CM | POA: Diagnosis not present

## 2019-04-07 DIAGNOSIS — Z7982 Long term (current) use of aspirin: Secondary | ICD-10-CM | POA: Diagnosis not present

## 2019-04-07 DIAGNOSIS — E7800 Pure hypercholesterolemia, unspecified: Secondary | ICD-10-CM | POA: Diagnosis not present

## 2019-04-07 NOTE — Telephone Encounter (Signed)
Returned patient's phone call.  Patient states he was having lower abdominal pain, nausea, dyspnea, wheezing, chest pain and pain left side.  Advised to go to the ED to be evaluated.

## 2019-04-07 NOTE — Telephone Encounter (Signed)
Patient called regarding lab results 

## 2019-04-07 NOTE — Telephone Encounter (Signed)
Lab results not yet received

## 2019-04-08 NOTE — Telephone Encounter (Signed)
I called patient, left msg with family member that lab results not yet received, advised pt to call me on Monday 04/11/2019. If lab results not received over the weekend then I will have Tammy call the lab, will need to verify if pt went to [facility] or [facility].

## 2019-04-09 DIAGNOSIS — R079 Chest pain, unspecified: Secondary | ICD-10-CM | POA: Diagnosis not present

## 2019-04-09 DIAGNOSIS — R1032 Left lower quadrant pain: Secondary | ICD-10-CM | POA: Diagnosis not present

## 2019-04-09 DIAGNOSIS — F909 Attention-deficit hyperactivity disorder, unspecified type: Secondary | ICD-10-CM | POA: Diagnosis not present

## 2019-04-09 DIAGNOSIS — F319 Bipolar disorder, unspecified: Secondary | ICD-10-CM | POA: Diagnosis not present

## 2019-04-09 DIAGNOSIS — Z79899 Other long term (current) drug therapy: Secondary | ICD-10-CM | POA: Diagnosis not present

## 2019-04-09 DIAGNOSIS — Z7982 Long term (current) use of aspirin: Secondary | ICD-10-CM | POA: Diagnosis not present

## 2019-04-09 DIAGNOSIS — I10 Essential (primary) hypertension: Secondary | ICD-10-CM | POA: Diagnosis not present

## 2019-04-09 DIAGNOSIS — E7800 Pure hypercholesterolemia, unspecified: Secondary | ICD-10-CM | POA: Diagnosis not present

## 2019-04-09 DIAGNOSIS — R0789 Other chest pain: Secondary | ICD-10-CM | POA: Diagnosis not present

## 2019-04-10 DIAGNOSIS — R079 Chest pain, unspecified: Secondary | ICD-10-CM | POA: Diagnosis not present

## 2019-04-11 ENCOUNTER — Emergency Department (HOSPITAL_COMMUNITY)
Admission: EM | Admit: 2019-04-11 | Discharge: 2019-04-11 | Disposition: A | Discharge status: Home / Self Care | Payer: Medicare Other | Attending: Emergency Medicine | Admitting: Emergency Medicine

## 2019-04-11 ENCOUNTER — Emergency Department (HOSPITAL_COMMUNITY): Payer: Medicare Other

## 2019-04-11 ENCOUNTER — Encounter (HOSPITAL_COMMUNITY): Payer: Self-pay | Admitting: *Deleted

## 2019-04-11 ENCOUNTER — Other Ambulatory Visit: Payer: Self-pay

## 2019-04-11 DIAGNOSIS — I10 Essential (primary) hypertension: Secondary | ICD-10-CM | POA: Diagnosis not present

## 2019-04-11 DIAGNOSIS — R0602 Shortness of breath: Secondary | ICD-10-CM | POA: Diagnosis not present

## 2019-04-11 DIAGNOSIS — Z79899 Other long term (current) drug therapy: Secondary | ICD-10-CM | POA: Diagnosis not present

## 2019-04-11 DIAGNOSIS — R079 Chest pain, unspecified: Secondary | ICD-10-CM | POA: Diagnosis not present

## 2019-04-11 DIAGNOSIS — R0789 Other chest pain: Secondary | ICD-10-CM | POA: Insufficient documentation

## 2019-04-11 DIAGNOSIS — R072 Precordial pain: Secondary | ICD-10-CM | POA: Diagnosis not present

## 2019-04-11 LAB — COMPREHENSIVE METABOLIC PANEL
ALT: 36 U/L (ref 0–44)
AST: 21 U/L (ref 15–41)
Albumin: 3.5 g/dL (ref 3.5–5.0)
Alkaline Phosphatase: 74 U/L (ref 38–126)
Anion gap: 10 (ref 5–15)
BUN: 13 mg/dL (ref 6–20)
CO2: 24 mmol/L (ref 22–32)
Calcium: 8.6 mg/dL — ABNORMAL LOW (ref 8.9–10.3)
Chloride: 104 mmol/L (ref 98–111)
Creatinine, Ser: 0.77 mg/dL (ref 0.61–1.24)
GFR calc Af Amer: >60 mL/min (ref >60)
GFR calc non Af Amer: >60 mL/min (ref >60)
Glucose, Bld: 128 mg/dL — ABNORMAL HIGH (ref 70–99)
Potassium: 4 mmol/L (ref 3.5–5.1)
Sodium: 138 mmol/L (ref 135–145)
Total Bilirubin: 0.4 mg/dL (ref 0.3–1.2)
Total Protein: 6.6 g/dL (ref 6.5–8.1)

## 2019-04-11 LAB — CBC WITH DIFFERENTIAL/PLATELET
Abs Immature Granulocytes: 0.01 10*3/uL (ref 0.00–0.07)
Basophils Absolute: 0 10*3/uL (ref 0.0–0.1)
Basophils Relative: 1 %
Eosinophils Absolute: 0.1 10*3/uL (ref 0.0–0.5)
Eosinophils Relative: 2 %
HCT: 42.1 % (ref 39.0–52.0)
Hemoglobin: 13.6 g/dL (ref 13.0–17.0)
Immature Granulocytes: 0 %
Lymphocytes Relative: 39 %
Lymphs Abs: 2.4 10*3/uL (ref 0.7–4.0)
MCH: 29.4 pg (ref 26.0–34.0)
MCHC: 32.3 g/dL (ref 30.0–36.0)
MCV: 90.9 fL (ref 80.0–100.0)
Monocytes Absolute: 0.7 10*3/uL (ref 0.1–1.0)
Monocytes Relative: 11 %
Neutro Abs: 2.8 10*3/uL (ref 1.7–7.7)
Neutrophils Relative %: 47 %
Platelets: 325 10*3/uL (ref 150–400)
RBC: 4.63 MIL/uL (ref 4.22–5.81)
RDW: 12.1 % (ref 11.5–15.5)
WBC: 6 10*3/uL (ref 4.0–10.5)
nRBC: 0 % (ref 0.0–0.2)

## 2019-04-11 LAB — TROPONIN I (HIGH SENSITIVITY)
Troponin I (High Sensitivity): 8 ng/L (ref <18)
Troponin I (High Sensitivity): 7 ng/L (ref <18)

## 2019-04-11 LAB — LIPASE, BLOOD: Lipase: 22 U/L (ref 11–51)

## 2019-04-11 NOTE — ED Triage Notes (Signed)
Pt c/o low blood pressure, low heart rate, pt states that he woke up this am and did not feel good, noticed that his heart rate was low and his blood pressure was low, started having left side chest pain and sob,  pt was seen at [facility] last night for chest tightness, had blood work performed, ekg with negative results, pt currently being seen by Gi for ? "bleeding on the inside"

## 2019-04-11 NOTE — Discharge Instructions (Addendum)
Follow-up with your GI as planned
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
## 2019-04-14 ENCOUNTER — Telehealth (INDEPENDENT_AMBULATORY_CARE_PROVIDER_SITE_OTHER): Payer: Self-pay | Admitting: Nurse Practitioner

## 2019-04-14 NOTE — Telephone Encounter (Signed)
Patient called stating he is in severe pain - would like a call back at [phone]

## 2019-04-14 NOTE — Telephone Encounter (Signed)
I called the patient.  He stated he had severe pain and went to the [facility] emergency room 3 days ago.  No significant findings on his evaluation.  He was advised to proceed with his EGD and colonoscopy as scheduled 04/20/2019.  He is taking Protonix 40 mg twice daily.  I asked that he start Pepcid 20 mg at bedtime and he may also take a dose midday if needed.  Bland diet, small portions.  If he is in true and distress to go back to the hospital emergency room.  Further follow-up to be determined after his EGD and colonoscopy has been completed.

## 2019-04-20 ENCOUNTER — Other Ambulatory Visit: Payer: Self-pay

## 2019-04-20 ENCOUNTER — Other Ambulatory Visit (HOSPITAL_COMMUNITY)
Admission: RE | Admit: 2019-04-20 | Discharge: 2019-04-20 | Disposition: A | Discharge status: Home / Self Care | Payer: Medicare Other | Source: Ambulatory Visit | Attending: Internal Medicine | Admitting: Internal Medicine

## 2019-04-20 ENCOUNTER — Encounter (HOSPITAL_COMMUNITY)
Admission: RE | Admit: 2019-04-20 | Discharge: 2019-04-20 | Disposition: A | Discharge status: Home / Self Care | Payer: Medicare Other | Source: Ambulatory Visit | Attending: Internal Medicine | Admitting: Internal Medicine

## 2019-04-20 DIAGNOSIS — Z01812 Encounter for preprocedural laboratory examination: Secondary | ICD-10-CM | POA: Insufficient documentation

## 2019-04-20 DIAGNOSIS — I10 Essential (primary) hypertension: Secondary | ICD-10-CM | POA: Diagnosis not present

## 2019-04-20 DIAGNOSIS — R0789 Other chest pain: Secondary | ICD-10-CM | POA: Diagnosis not present

## 2019-04-20 DIAGNOSIS — Z79899 Other long term (current) drug therapy: Secondary | ICD-10-CM | POA: Diagnosis not present

## 2019-04-20 DIAGNOSIS — F319 Bipolar disorder, unspecified: Secondary | ICD-10-CM | POA: Diagnosis not present

## 2019-04-20 DIAGNOSIS — Z20828 Contact with and (suspected) exposure to other viral communicable diseases: Secondary | ICD-10-CM | POA: Insufficient documentation

## 2019-04-20 DIAGNOSIS — E785 Hyperlipidemia, unspecified: Secondary | ICD-10-CM | POA: Diagnosis not present

## 2019-04-20 DIAGNOSIS — R079 Chest pain, unspecified: Secondary | ICD-10-CM | POA: Diagnosis not present

## 2019-04-20 DIAGNOSIS — Z880 Allergy status to penicillin: Secondary | ICD-10-CM | POA: Diagnosis not present

## 2019-04-20 LAB — SARS CORONAVIRUS 2 (TAT 6-24 HRS): SARS Coronavirus 2: NEGATIVE

## 2019-04-21 ENCOUNTER — Telehealth: Payer: Self-pay | Admitting: Family Medicine

## 2019-04-21 NOTE — Telephone Encounter (Signed)
Pt calling, had called short stay first and was directed to [place]. States he has colonoscopy tomorrow and "Can not get anymore of the solution down." States has half bottle left; stool is still dark brown.  Questioning if he could take OTC laxative. Suggested pt mix solution in beverage, states has done so, "Im vomiting it back up." Advised to check with pharmacist for advise on what to take if appropriate as [place] could not advise on medication/bowel prep.  Pt verbalizes understanding

## 2019-04-22 ENCOUNTER — Ambulatory Visit (HOSPITAL_COMMUNITY)
Admission: RE | Admit: 2019-04-22 | Discharge: 2019-04-22 | Disposition: A | Discharge status: Home / Self Care | Payer: Medicare Other | Attending: Internal Medicine | Admitting: Internal Medicine

## 2019-04-22 ENCOUNTER — Encounter (HOSPITAL_COMMUNITY): Payer: Self-pay

## 2019-04-22 ENCOUNTER — Other Ambulatory Visit: Payer: Self-pay

## 2019-04-22 ENCOUNTER — Ambulatory Visit (HOSPITAL_COMMUNITY): Payer: Medicare Other | Admitting: Anesthesiology

## 2019-04-22 ENCOUNTER — Encounter (HOSPITAL_COMMUNITY)
Admission: RE | Disposition: A | Discharge status: Home / Self Care | Payer: Self-pay | Source: Home / Self Care | Attending: Internal Medicine

## 2019-04-22 DIAGNOSIS — Z881 Allergy status to other antibiotic agents status: Secondary | ICD-10-CM | POA: Insufficient documentation

## 2019-04-22 DIAGNOSIS — K760 Fatty (change of) liver, not elsewhere classified: Secondary | ICD-10-CM | POA: Diagnosis not present

## 2019-04-22 DIAGNOSIS — K2950 Unspecified chronic gastritis without bleeding: Secondary | ICD-10-CM | POA: Diagnosis not present

## 2019-04-22 DIAGNOSIS — I10 Essential (primary) hypertension: Secondary | ICD-10-CM | POA: Diagnosis not present

## 2019-04-22 DIAGNOSIS — Z8249 Family history of ischemic heart disease and other diseases of the circulatory system: Secondary | ICD-10-CM | POA: Diagnosis not present

## 2019-04-22 DIAGNOSIS — K921 Melena: Secondary | ICD-10-CM | POA: Diagnosis not present

## 2019-04-22 DIAGNOSIS — Z880 Allergy status to penicillin: Secondary | ICD-10-CM | POA: Diagnosis not present

## 2019-04-22 DIAGNOSIS — K228 Other specified diseases of esophagus: Secondary | ICD-10-CM | POA: Diagnosis not present

## 2019-04-22 DIAGNOSIS — Z8709 Personal history of other diseases of the respiratory system: Secondary | ICD-10-CM | POA: Insufficient documentation

## 2019-04-22 DIAGNOSIS — R933 Abnormal findings on diagnostic imaging of other parts of digestive tract: Secondary | ICD-10-CM | POA: Diagnosis not present

## 2019-04-22 DIAGNOSIS — R0789 Other chest pain: Secondary | ICD-10-CM | POA: Diagnosis not present

## 2019-04-22 DIAGNOSIS — K2100 Gastro-esophageal reflux disease with esophagitis, without bleeding: Secondary | ICD-10-CM | POA: Insufficient documentation

## 2019-04-22 DIAGNOSIS — F410 Panic disorder [episodic paroxysmal anxiety] without agoraphobia: Secondary | ICD-10-CM | POA: Diagnosis not present

## 2019-04-22 DIAGNOSIS — F319 Bipolar disorder, unspecified: Secondary | ICD-10-CM | POA: Insufficient documentation

## 2019-04-22 DIAGNOSIS — K219 Gastro-esophageal reflux disease without esophagitis: Secondary | ICD-10-CM | POA: Diagnosis not present

## 2019-04-22 DIAGNOSIS — K648 Other hemorrhoids: Secondary | ICD-10-CM | POA: Diagnosis not present

## 2019-04-22 DIAGNOSIS — M1990 Unspecified osteoarthritis, unspecified site: Secondary | ICD-10-CM | POA: Insufficient documentation

## 2019-04-22 DIAGNOSIS — R1032 Left lower quadrant pain: Secondary | ICD-10-CM | POA: Diagnosis not present

## 2019-04-22 DIAGNOSIS — K3189 Other diseases of stomach and duodenum: Secondary | ICD-10-CM | POA: Insufficient documentation

## 2019-04-22 DIAGNOSIS — K621 Rectal polyp: Secondary | ICD-10-CM | POA: Diagnosis not present

## 2019-04-22 DIAGNOSIS — K449 Diaphragmatic hernia without obstruction or gangrene: Secondary | ICD-10-CM | POA: Diagnosis not present

## 2019-04-22 DIAGNOSIS — K208 Other esophagitis without bleeding: Secondary | ICD-10-CM | POA: Diagnosis not present

## 2019-04-22 DIAGNOSIS — Z79899 Other long term (current) drug therapy: Secondary | ICD-10-CM | POA: Insufficient documentation

## 2019-04-22 DIAGNOSIS — E785 Hyperlipidemia, unspecified: Secondary | ICD-10-CM | POA: Insufficient documentation

## 2019-04-22 HISTORY — PX: ESOPHAGOGASTRODUODENOSCOPY (EGD) WITH PROPOFOL: SHX5813

## 2019-04-22 HISTORY — PX: BIOPSY: SHX5522

## 2019-04-22 HISTORY — PX: COLONOSCOPY WITH PROPOFOL: SHX5780

## 2019-04-22 HISTORY — PX: POLYPECTOMY: SHX5525

## 2019-04-22 SURGERY — ESOPHAGOGASTRODUODENOSCOPY (EGD) WITH PROPOFOL
Anesthesia: General

## 2019-04-22 MED ORDER — LACTATED RINGERS IV SOLN
INTRAVENOUS | Status: DC | PRN
  Administered 2019-04-22: 10:00:00 via INTRAVENOUS

## 2019-04-22 MED ORDER — PROMETHAZINE HCL 25 MG/ML IJ SOLN: 6.2500 mg | INTRAMUSCULAR | Status: DC | PRN

## 2019-04-22 MED ORDER — HYDROMORPHONE HCL 1 MG/ML IJ SOLN: 0.2500 mg | INTRAMUSCULAR | Status: DC | PRN

## 2019-04-22 MED ORDER — PROPOFOL 10 MG/ML IV BOLUS
INTRAVENOUS | Status: AC
  Filled 2019-04-22: qty 40

## 2019-04-22 MED ORDER — STERILE WATER FOR IRRIGATION IR SOLN
Status: DC | PRN
  Administered 2019-04-22: 2.5 mL

## 2019-04-22 MED ORDER — CHLORHEXIDINE GLUCONATE CLOTH 2 % EX PADS: 6.0000 | MEDICATED_PAD | Freq: Once | CUTANEOUS | Status: DC

## 2019-04-22 MED ORDER — MIDAZOLAM HCL 2 MG/2ML IJ SOLN
INTRAMUSCULAR | Status: AC
  Filled 2019-04-22: qty 2

## 2019-04-22 MED ORDER — HYDROCODONE-ACETAMINOPHEN 7.5-325 MG PO TABS: 1.0000 | ORAL_TABLET | Freq: Once | ORAL | Status: DC | PRN

## 2019-04-22 MED ORDER — KETAMINE HCL 10 MG/ML IJ SOLN
INTRAMUSCULAR | Status: DC | PRN
  Administered 2019-04-22: 20 mg via INTRAVENOUS

## 2019-04-22 MED ORDER — MIDAZOLAM HCL 5 MG/5ML IJ SOLN
INTRAMUSCULAR | Status: DC | PRN
  Administered 2019-04-22: 2 mg via INTRAVENOUS

## 2019-04-22 MED ORDER — KETAMINE HCL 50 MG/5ML IJ SOSY
PREFILLED_SYRINGE | INTRAMUSCULAR | Status: AC
  Filled 2019-04-22: qty 5

## 2019-04-22 MED ORDER — PROPOFOL 500 MG/50ML IV EMUL
INTRAVENOUS | Status: DC | PRN
  Administered 2019-04-22: 150 ug/kg/min via INTRAVENOUS

## 2019-04-22 MED ORDER — MIDAZOLAM HCL 2 MG/2ML IJ SOLN: 0.5000 mg | Freq: Once | INTRAMUSCULAR | Status: DC | PRN

## 2019-04-22 MED ORDER — HYDROCORTISONE ACETATE 25 MG RE SUPP: 25.0000 mg | Freq: Every day | RECTAL | 0 refills | Status: DC

## 2019-04-22 NOTE — Discharge Instructions (Signed)
No aspirin or NSAIDs for 24 hours. Resume usual medications as before. Resume usual diet. No driving for 24 hours. Physician will call with biopsy results.   Colon Polyps  Polyps are tissue growths inside the body. Polyps can grow in many places, including the large intestine (colon). A polyp may be a round bump or a mushroom-shaped growth. You could have one polyp or several. Most colon polyps are noncancerous (benign). However, some colon polyps can become cancerous over time. Finding and removing the polyps early can help prevent this. What are the causes? The exact cause of colon polyps is not known. What increases the risk? You are more likely to develop this condition if you:  Have a family history of colon cancer or colon polyps.  Are older than 90 or older than 45 if you are African American.  Have inflammatory bowel disease, such as ulcerative colitis or Crohn's disease.  Have certain hereditary conditions, such as: ? Familial adenomatous polyposis. ? Lynch syndrome. ? Turcot syndrome. ? Peutz-Jeghers syndrome.  Are overweight.  Smoke cigarettes.  Do not get enough exercise.  Drink too much alcohol.  Eat a diet that is high in fat and red meat and low in fiber.  Had childhood cancer that was treated with abdominal radiation. What are the signs or symptoms? Most polyps do not cause symptoms. If you have symptoms, they may include:  Blood coming from your rectum when having a bowel movement.  Blood in your stool. The stool may look dark red or black.  Abdominal pain.  A change in bowel habits, such as constipation or diarrhea. How is this diagnosed? This condition is diagnosed with a colonoscopy. This is a procedure in which a lighted, flexible scope is inserted into the anus and then passed into the colon to examine the area. Polyps are sometimes found when a colonoscopy is done as part of routine cancer screening tests. How is this treated? Treatment for  this condition involves removing any polyps that are found. Most polyps can be removed during a colonoscopy. Those polyps will then be tested for cancer. Additional treatment may be needed depending on the results of testing. Follow these instructions at home: Lifestyle  Maintain a healthy weight, or lose weight if recommended by your health care provider.  Exercise every day or as told by your health care provider.  Do not use any products that contain nicotine or tobacco, such as cigarettes and e-cigarettes. If you need help quitting, ask your health care provider.  If you drink alcohol, limit how much you have: ? 0-1 drink a day for women. ? 0-2 drinks a day for men.  Be aware of how much alcohol is in your drink. In the [place] one drink equals one 12 oz bottle of beer (355 mL), one 5 oz glass of wine (148 mL), or one 1 oz shot of hard liquor (44 mL). Eating and drinking   Eat foods that are high in fiber, such as fruits, vegetables, and whole grains.  Eat foods that are high in calcium and vitamin D, such as milk, cheese, yogurt, eggs, liver, fish, and broccoli.  Limit foods that are high in fat, such as fried foods and desserts.  Limit the amount of red meat and processed meat you eat, such as hot dogs, sausage, bacon, and lunch meats. General instructions  Keep all follow-up visits as told by your health care provider. This is important. ? This includes having regularly scheduled colonoscopies. ? Talk to your  health care provider about when you need a colonoscopy. Contact a health care provider if:  You have new or worsening bleeding during a bowel movement.  You have new or increased blood in your stool.  You have a change in bowel habits.  You lose weight for no known reason. Summary  Polyps are tissue growths inside the body. Polyps can grow in many places, including the colon.  Most colon polyps are noncancerous (benign), but some can become cancerous over  time.  This condition is diagnosed with a colonoscopy.  Treatment for this condition involves removing any polyps that are found. Most polyps can be removed during a colonoscopy. This information is not intended to replace advice given to you by your health care provider. Make sure you discuss any questions you have with your health care provider. Document Released: 03/19/2004 Document Revised: 10/08/2017 Document Reviewed: 10/08/2017 Elsevier Patient Education  [place].  Colonoscopy, Adult, Care After This sheet gives you information about how to care for yourself after your procedure. Your doctor may also give you more specific instructions. If you have problems or questions, call your doctor. What can I expect after the procedure? After the procedure, it is common to have:  A small amount of blood in your poop for 24 hours.  Some gas.  Mild cramping or bloating in your belly. Follow these instructions at home: General instructions  For the first 24 hours after the procedure: ? Do not drive or use machinery. ? Do not sign important documents. ? Do not drink alcohol. ? Do your daily activities more slowly than normal. ? Eat foods that are soft and easy to digest.  Take over-the-counter or prescription medicines only as told by your doctor. To help cramping and bloating:   Try walking around.  Put heat on your belly (abdomen) as told by your doctor. Use a heat source that your doctor recommends, such as a moist heat pack or a heating pad. ? Put a towel between your skin and the heat source. ? Leave the heat on for 20-30 minutes. ? Remove the heat if your skin turns bright red. This is especially important if you cannot feel pain, heat, or cold. You can get burned. Eating and drinking   Drink enough fluid to keep your pee (urine) clear or pale yellow.  Return to your normal diet as told by your doctor. Avoid heavy or fried foods that are hard to digest.  Avoid  drinking alcohol for as long as told by your doctor. Contact a doctor if:  You have blood in your poop (stool) 2-3 days after the procedure. Get help right away if:  You have more than a small amount of blood in your poop.  You see large clumps of tissue (blood clots) in your poop.  Your belly is swollen.  You feel sick to your stomach (nauseous).  You throw up (vomit).  You have a fever.  You have belly pain that gets worse, and medicine does not help your pain. Summary  After the procedure, it is common to have a small amount of blood in your poop. You may also have mild cramping and bloating in your belly.  For the first 24 hours after the procedure, do not drive or use machinery, do not sign important documents, and do not drink alcohol.  Get help right away if you have a lot of blood in your poop, feel sick to your stomach, have a fever, or have more belly  pain. This information is not intended to replace advice given to you by your health care provider. Make sure you discuss any questions you have with your health care provider. Document Released: 07/26/2010 Document Revised: 04/23/2017 Document Reviewed: 03/17/2016 Elsevier Patient Education  2020 [place].  Upper Endoscopy, Adult, Care After This sheet gives you information about how to care for yourself after your procedure. Your health care provider may also give you more specific instructions. If you have problems or questions, contact your health care provider. What can I expect after the procedure? After the procedure, it is common to have:  A sore throat.  Mild stomach pain or discomfort.  Bloating.  Nausea. Follow these instructions at home:   Follow instructions from your health care provider about what to eat or drink after your procedure.  Return to your normal activities as told by your health care provider. Ask your health care provider what activities are safe for you.  Take over-the-counter  and prescription medicines only as told by your health care provider.  Do not drive for 24 hours if you were given a sedative during your procedure.  Keep all follow-up visits as told by your health care provider. This is important. Contact a health care provider if you have:  A sore throat that lasts longer than one day.  Trouble swallowing. Get help right away if:  You vomit blood or your vomit looks like coffee grounds.  You have: ? A fever. ? Bloody, black, or tarry stools. ? A severe sore throat or you cannot swallow. ? Difficulty breathing. ? Severe pain in your chest or abdomen. Summary  After the procedure, it is common to have a sore throat, mild stomach discomfort, bloating, and nausea.  Do not drive for 24 hours if you were given a sedative during the procedure.  Follow instructions from your health care provider about what to eat or drink after your procedure.  Return to your normal activities as told by your health care provider. This information is not intended to replace advice given to you by your health care provider. Make sure you discuss any questions you have with your health care provider. Document Released: 12/23/2011 Document Revised: 12/15/2017 Document Reviewed: 11/23/2017 Elsevier Patient Education  2020 [place] After These instructions provide you with information about caring for yourself after your procedure. Your health care provider may also give you more specific instructions. Your treatment has been planned according to current medical practices, but problems sometimes occur. Call your health care provider if you have any problems or questions after your procedure. What can I expect after the procedure? After your procedure, you may:  Feel sleepy for several hours.  Feel clumsy and have poor balance for several hours.  Feel forgetful about what happened after the procedure.  Have poor judgment for  several hours.  Feel nauseous or vomit.  Have a sore throat if you had a breathing tube during the procedure. Follow these instructions at home: For at least 24 hours after the procedure:      Have a responsible adult stay with you. It is important to have someone help care for you until you are awake and alert.  Rest as needed.  Do not: ? Participate in activities in which you could fall or become injured. ? Drive. ? Use heavy machinery. ? Drink alcohol. ? Take sleeping pills or medicines that cause drowsiness. ? Make important decisions or sign legal documents. ? Take  care of children on your own. Eating and drinking  Follow the diet that is recommended by your health care provider.  If you vomit, drink water, juice, or soup when you can drink without vomiting.  Make sure you have little or no nausea before eating solid foods. General instructions  Take over-the-counter and prescription medicines only as told by your health care provider.  If you have sleep apnea, surgery and certain medicines can increase your risk for breathing problems. Follow instructions from your health care provider about wearing your sleep device: ? Anytime you are sleeping, including during daytime naps. ? While taking prescription pain medicines, sleeping medicines, or medicines that make you drowsy.  If you smoke, do not smoke without supervision.  Keep all follow-up visits as told by your health care provider. This is important. Contact a health care provider if:  You keep feeling nauseous or you keep vomiting.  You feel light-headed.  You develop a rash.  You have a fever. Get help right away if:  You have trouble breathing. Summary  For several hours after your procedure, you may feel sleepy and have poor judgment.  Have a responsible adult stay with you for at least 24 hours or until you are awake and alert. This information is not intended to replace advice given to you by  your health care provider. Make sure you discuss any questions you have with your health care provider. Document Released: 10/14/2015 Document Revised: 09/21/2017 Document Reviewed: 10/14/2015 Elsevier Patient Education  2020 [employer].

## 2019-04-22 NOTE — Anesthesia Postprocedure Evaluation (Signed)
Anesthesia Post Note  Patient: KYLOR VALVERDE  Procedure(s) Performed: ESOPHAGOGASTRODUODENOSCOPY (EGD) WITH PROPOFOL (N/A ) COLONOSCOPY WITH PROPOFOL (N/A ) BIOPSY POLYPECTOMY  Patient location during evaluation: PACU Anesthesia Type: MAC Level of consciousness: awake and alert, oriented and patient cooperative Pain management: pain level controlled Vital Signs Assessment: post-procedure vital signs reviewed and stable Respiratory status: spontaneous breathing, respiratory function stable and nonlabored ventilation Cardiovascular status: stable Postop Assessment: no apparent nausea or vomiting Anesthetic complications: no     Last Vitals:  Vitals:   04/22/19 0828  BP: 96/73  Pulse: 79  Resp: 18  Temp: 37.5 C  SpO2: 98%    Last Pain:  Vitals:   04/22/19 0828  TempSrc: Oral  PainSc: 0-No pain                 Jasper Hanf

## 2019-04-22 NOTE — Transfer of Care (Signed)
Immediate Anesthesia Transfer of Care Note  Patient: Preston Berry  Procedure(s) Performed: ESOPHAGOGASTRODUODENOSCOPY (EGD) WITH PROPOFOL (N/A ) COLONOSCOPY WITH PROPOFOL (N/A ) BIOPSY POLYPECTOMY  Patient Location: PACU  Anesthesia Type:MAC  Level of Consciousness: awake, alert , oriented and patient cooperative  Airway & Oxygen Therapy: Patient Spontanous Breathing  Post-op Assessment: Report given to RN, Post -op Vital signs reviewed and stable and Patient moving all extremities X 4  Post vital signs: Reviewed and stable  Last Vitals:  Vitals Value Taken Time  BP    Temp    Pulse 78 04/22/19 1030  Resp 19 04/22/19 1030  SpO2 94 % 04/22/19 1030  Vitals shown include unvalidated device data.  Last Pain:  Vitals:   04/22/19 0828  TempSrc: Oral  PainSc: 0-No pain      Patients Stated Pain Goal: 7 (47/58/30 7460)  Complications: No apparent anesthesia complications

## 2019-04-22 NOTE — Op Note (Signed)
[facility] Patient Name: Preston Berry Procedure Date: 04/22/2019 10:03 AM MRN: [id number] Date of Birth: 1986-06-22 Attending MD: Hildred Laser , MD CSN: [id number] Age: 33 Admit Type: Outpatient Procedure:                Colonoscopy Indications:              Abdominal pain in the left lower quadrant,                            Hematochezia, Abnormal CT of the GI tract Providers:                Hildred Laser, MD, Lurline Del, RN, Raphael Gibney,                            Technician Referring MD:             Connye Burkitt. Rakes Medicines:                Propofol per Anesthesia Complications:            No immediate complications. Estimated Blood Loss:     Estimated blood loss was minimal. Procedure:                Pre-Anesthesia Assessment:                           - Prior to the procedure, a History and Physical                            was performed, and patient medications and                            allergies were reviewed. The patient's tolerance of                            previous anesthesia was also reviewed. The risks                            and benefits of the procedure and the sedation                            options and risks were discussed with the patient.                            All questions were answered, and informed consent                            was obtained. Prior Anticoagulants: The patient has                            taken no previous anticoagulant or antiplatelet                            agents except for NSAID medication. ASA Grade  Assessment: II - A patient with mild systemic                            disease. After reviewing the risks and benefits,                            the patient was deemed in satisfactory condition to                            undergo the procedure.                           After obtaining informed consent, the colonoscope                            was passed under direct  vision. Throughout the                            procedure, the patient's blood pressure, pulse, and                            oxygen saturations were monitored continuously. The                            [id number]-[id number]) scope was introduced through                            the anus and advanced to the the cecum, identified                            by appendiceal orifice and ileocecal valve. The                            colonoscopy was performed without difficulty. The                            patient tolerated the procedure well. The quality                            of the bowel preparation was good. The ileocecal                            valve, appendiceal orifice, and rectum were                            photographed. Scope In: 10:05:23 AM Scope Out: 10:22:11 AM Scope Withdrawal Time: 0 hours 11 minutes 24 seconds  Total Procedure Duration: 0 hours 16 minutes 48 seconds  Findings:      The perianal and digital rectal examinations were normal.      The colon (entire examined portion) appeared normal.      Three sessile polyps were found in the rectum. The polyps were       diminutive in size. These were biopsied with a cold forceps for  histology. The pathology specimen was placed into Bottle Number 3.      Internal hemorrhoids were found during retroflexion. The hemorrhoids       were small. Impression:               - The entire examined colon is normal.                           - Three diminutive polyps in the rectum. Biopsied.                           - Internal hemorrhoids. Moderate Sedation:      Per Anesthesia Care Recommendation:           - Patient has a contact number available for                            emergencies. The signs and symptoms of potential                            delayed complications were discussed with the                            patient. Return to normal activities tomorrow.                            Written discharge  instructions were provided to the                            patient.                           - Resume previous diet today.                           - Continue present medications.                           - Anusol (pramoxine) suppository: Insert rectally                            daily for 2 weeks.                           - Await pathology results.                           - No recommendation at this time regarding repeat                            colonoscopy. Procedure Code(s):        --- Professional ---                           [phone], Colonoscopy, flexible; with biopsy, single                            or multiple Diagnosis Code(s):        ---  Professional ---                           K64.8, Other hemorrhoids                           K62.1, Rectal polyp                           R10.32, Left lower quadrant pain                           K92.1, Melena (includes Hematochezia)                           R93.3, Abnormal findings on diagnostic imaging of                            other parts of digestive tract CPT copyright 2019 [employer]. All rights reserved. The codes documented in this report are preliminary and upon coder review may  be revised to meet current compliance requirements. Hildred Laser, MD Hildred Laser, MD 04/22/2019 10:35:36 AM This report has been signed electronically. Number of Addenda: 0

## 2019-04-22 NOTE — H&P (Signed)
Preston Berry is an 33 y.o. male.   Chief Complaint: Patient is here for esophagogastroduodenoscopy and colonoscopy. HPI: Patient is 33 year old Caucasian male who presents with over a year history of LLQ abdominal pain.  He also complains of intermittent hematochezia.  Bleeding occurs with his bowel movements.  He states he passes enough blood to cover the commode water red.  He is having normal bowel movements.  He denies diarrhea and/or constipation.  He gives remote history of perirectal injury while sledding and had reconstructive surgery when he was 33 years old.  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   He also complains of frequent heartburn and left-sided chest pain.  He describes these to to be different.  He was taking 4-5 Tums every day until he was begun on pantoprazole.  He feels heartburn is well controlled.  He is watching his diet.  Cardiac work-up has been negative and his chest pain was felt to be noncardiac or due to anxiety.  He feels better since he was begun on BuSpar.  He has lost 18 pounds voluntarily. Patient states he has been under a lot of stress.  His mother died 1 year ago and prior to that he lost 28 family members and/or friends. He does not smoke cigarettes or drink alcohol. Family history is negative for CRC or IBD.  Past Medical History:  Diagnosis Date  . Abdominal pain 02/19/2015  . Abnormal EKG 01/27/2019  . Arthritis   . Bipolar disorder ([place]) 04/10/2014  . Chest pain, rule out acute myocardial infarction 12/28/2018  . Colitis   . Constipation 02/08/2019  . Depression   . Eczema 09/18/2015  . Essential hypertension 01/13/2019  . Fatty liver   . GERD (gastroesophageal reflux disease)   . Gynecomastia   . H/O lead exposure 02/19/2015  . Hepatic steatosis   . Hepatomegaly   . Hyperlipidemia   . Hypertension   . LLQ abdominal pain 02/08/2019  . Panic attacks 04/10/2014  . Rectal  bleeding 02/08/2019  . Transaminitis 02/19/2015    Past Surgical History:  Procedure Laterality Date  . DEBRIDEMENT AND CLOSURE WOUND    . OTHER SURGICAL HISTORY     surgery to remove glass from peritoneal and buttox area d/t sled accident    Family History  Problem Relation Age of Onset  . Diabetes Mellitus II Mother   . Diabetes Father   . Alcohol abuse Father   . Stroke Father   . Heart attack Father        In his 75's   Social History:  reports that he has never smoked. He has never used smokeless tobacco. He reports that he does not drink alcohol or use drugs.  Allergies:  Allergies  Allergen Reactions  . Penicillins Anaphylaxis and Swelling    Did it involve swelling of the face/tongue/throat, SOB, or low BP? Yes Did it involve sudden or severe rash/hives, skin peeling, or any reaction on the inside of your mouth or nose? No Did you need to seek medical attention at a hospital or doctor's office? unknown When did it last happen?over 15 years If all above answers are "NO", may proceed with cephalosporin use.   Marland Kitchen Keflex [Cephalexin]     Patient states he is allergic to cephlasporins. Unknown reaction    Medications Prior to Admission  Medication Sig Dispense Refill  . atorvastatin (LIPITOR) 20 MG tablet Take 20 mg by mouth daily.    Marland Kitchen  busPIRone (BUSPAR) 5 MG tablet Take 5 mg by mouth 3 (three) times daily as needed for anxiety.    Marland Kitchen ibuprofen (ADVIL) 400 MG tablet Take 400 mg by mouth every 8 (eight) hours as needed for pain.    Marland Kitchen lisinopril (ZESTRIL) 5 MG tablet Take 5 mg by mouth daily.    . pantoprazole (PROTONIX) 40 MG tablet Take 1 tablet (40 mg total) by mouth daily. (Patient taking differently: Take 40 mg by mouth 2 (two) times daily. ) 90 tablet 3  . PEG 3350-KCl-NaBcb-NaCl-NaSulf (PEG-3350/ELECTROLYTES) 236 g SOLR Take 4,000 mLs by mouth once.    . benzonatate (TESSALON PERLES) 100 MG capsule Take 1 capsule (100 mg total) by mouth 3 (three) times daily as  needed. (Patient not taking: Reported on 04/14/2019) 20 capsule 0  . cetirizine (ZYRTEC) 10 MG tablet Take 1 tablet (10 mg total) by mouth daily. (Patient not taking: Reported on 04/14/2019) 30 tablet 11  . guaiFENesin (MUCINEX) 600 MG 12 hr tablet Take 1 tablet (600 mg total) by mouth 2 (two) times daily. (Patient not taking: Reported on 04/14/2019) 60 tablet 0    No results found for this or any previous visit (from the past 48 hour(s)). No results found.  ROS  Blood pressure 96/73, pulse 79, temperature 99.5 F (37.5 C), temperature source Oral, resp. rate 18, SpO2 98 %. Physical Exam  Constitutional: He appears well-developed and well-nourished.  HENT:  Mouth/Throat: Oropharynx is clear and moist.  Eyes: Conjunctivae are normal. No scleral icterus.  Neck: No thyromegaly present.  Cardiovascular: Normal rate, regular rhythm and normal heart sounds.  No murmur heard. Respiratory: Effort normal and breath sounds normal.  GI:  Abdomen is full.  On palpation is soft.  He has tenderness in left lower and left upper quadrant which is superficial.  No organomegaly or masses.  Lymphadenopathy:    He has no cervical adenopathy.     Assessment/Plan Chronic GERD. Noncardiac chest pain possibly due to anxiety. Sided abdominal pain hematochezia and abnormal CT in reference to rectum. Diagnostic esophagogastroduodenoscopy and colonoscopy.  Hildred Laser, MD 04/22/2019, 9:37 AM

## 2019-04-22 NOTE — Op Note (Signed)
[facility] Patient Name: Preston Berry Procedure Date: 04/22/2019 9:29 AM MRN: [id number] Date of Birth: [birth year] Attending MD: Hildred Laser , MD CSN: [id number] Age: 33 Admit Type: Outpatient Procedure:                Upper GI endoscopy Indications:              Follow-up of gastro-esophageal reflux disease,                            Chest pain (non cardiac) Providers:                Hildred Laser, MD, Lurline Del, RN, Raphael Gibney,                            Technician Referring MD:             Connye Burkitt. Rakes Medicines:                Propofol per Anesthesia Complications:            No immediate complications. Estimated Blood Loss:     Estimated blood loss was minimal. Procedure:                Pre-Anesthesia Assessment:                           - Prior to the procedure, a History and Physical                            was performed, and patient medications and                            allergies were reviewed. The patient's tolerance of                            previous anesthesia was also reviewed. The risks                            and benefits of the procedure and the sedation                            options and risks were discussed with the patient.                            All questions were answered, and informed consent                            was obtained. Prior Anticoagulants: The patient has                            taken no previous anticoagulant or antiplatelet                            agents except for NSAID medication. ASA Grade  Assessment: II - A patient with mild systemic                            disease. After reviewing the risks and benefits,                            the patient was deemed in satisfactory condition to                            undergo the procedure.                           After obtaining informed consent, the endoscope was                            passed under direct vision.  Throughout the                            procedure, the patient's blood pressure, pulse, and                            oxygen saturations were monitored continuously. The                            GIF-H190 [id number]) scope was introduced through the                            mouth, and advanced to the second part of duodenum.                            The upper GI endoscopy was accomplished without                            difficulty. The patient tolerated the procedure                            well. Scope In: 9:49:40 AM Scope Out: 9:59:57 AM Total Procedure Duration: 0 hours 10 minutes 17 seconds  Findings:      The examined esophagus was normal.      The Z-line was regular and was found 34 cm from the incisors.      A 2 cm hiatal hernia was present.      One polyp was found at GEJ. Biopsies were taken with a cold forceps for       histology. The pathology specimen was placed into Bottle Number 2.      A few dispersed erosions were found in the gastric antrum. Biopsies were       taken with a cold forceps for histology. The pathology specimen was       placed into Bottle Number 1.      The exam of the stomach was otherwise normal. Impression:               - Normal esophagus.                           -  Z-line regular, 34 cm from the incisors.                           - 2 cm hiatal hernia.                           - Small Polyp at GEJ on gastric side. Possibly                            hyperplastic polyp. Biopsied.                           - Erosive gastropathy. Biopsied. Moderate Sedation:      Per Anesthesia Care Recommendation:           - Patient has a contact number available for                            emergencies. The signs and symptoms of potential                            delayed complications were discussed with the                            patient. Return to normal activities tomorrow.                            Written discharge instructions were  provided to the                            patient.                           - Resume previous diet today.                           - Continue present medications.                           - No aspirin, ibuprofen, naproxen, or other                            non-steroidal anti-inflammatory drugs for 1 day.                           - Await pathology results. Procedure Code(s):        --- Professional ---                           [phone], Esophagogastroduodenoscopy, flexible,                            transoral; with biopsy, single or multiple Diagnosis Code(s):        --- Professional ---                           K44.9, Diaphragmatic hernia without obstruction or  gangrene                           K22.8, Other specified diseases of esophagus                           K31.89, Other diseases of stomach and duodenum                           K21.9, Gastro-esophageal reflux disease without                            esophagitis                           R07.89, Other chest pain CPT copyright 2019 American Medical Association. All rights reserved. The codes documented in this report are preliminary and upon coder review may  be revised to meet current compliance requirements. Hildred Laser, MD Hildred Laser, MD 04/22/2019 10:31:28 AM This report has been signed electronically. Number of Addenda: 0

## 2019-04-22 NOTE — Anesthesia Preprocedure Evaluation (Signed)
Anesthesia Evaluation  Patient identified by MRN, date of birth, ID band Patient awake    Reviewed: Allergy & Precautions, NPO status , Patient's Chart, lab work & pertinent test results  Airway Mallampati: II  TM Distance: >3 FB Neck ROM: Full    Dental no notable dental hx. (+) Teeth Intact   Pulmonary neg pulmonary ROS,  Reports childhood asthma  No inhaler use in over 10 years    Pulmonary exam normal breath sounds clear to auscultation       Cardiovascular Exercise Tolerance: Good hypertension, Pt. on medications negative cardio ROS Normal cardiovascular examI Rhythm:Regular Rate:Normal  Reports exercise bike use -denies CP-goes 30 min slowly    Neuro/Psych PSYCHIATRIC DISORDERS Anxiety Depression Bipolar Disorder negative neurological ROS     GI/Hepatic Neg liver ROS, GERD  Medicated and Controlled,  Endo/Other  negative endocrine ROS  Renal/GU negative Renal ROS  negative genitourinary   Musculoskeletal  (+) Arthritis , Osteoarthritis,    Abdominal   Peds negative pediatric ROS (+)  Hematology negative hematology ROS (+)   Anesthesia Other Findings   Reproductive/Obstetrics negative OB ROS                             Anesthesia Physical Anesthesia Plan  ASA: II  Anesthesia Plan: General   Post-op Pain Management:    Induction: Intravenous  PONV Risk Score and Plan: 2 and Propofol infusion, TIVA and Treatment may vary due to age or medical condition  Airway Management Planned: Simple Face Mask and Nasal Cannula  Additional Equipment:   Intra-op Plan:   Post-operative Plan:   Informed Consent: I have reviewed the patients History and Physical, chart, labs and discussed the procedure including the risks, benefits and alternatives for the proposed anesthesia with the patient or authorized representative who has indicated his/her understanding and acceptance.      Dental advisory given  Plan Discussed with: CRNA  Anesthesia Plan Comments: (Plan Full PPE use  Plan GA with GETA as needed d/w pt -WTP with same after Q&A)        Anesthesia Quick Evaluation

## 2019-04-25 LAB — SURGICAL PATHOLOGY

## 2019-04-28 ENCOUNTER — Encounter (HOSPITAL_COMMUNITY): Payer: Self-pay | Admitting: Internal Medicine

## 2019-04-28 DIAGNOSIS — F909 Attention-deficit hyperactivity disorder, unspecified type: Secondary | ICD-10-CM | POA: Diagnosis not present

## 2019-04-28 DIAGNOSIS — Z880 Allergy status to penicillin: Secondary | ICD-10-CM | POA: Diagnosis not present

## 2019-04-28 DIAGNOSIS — Z79899 Other long term (current) drug therapy: Secondary | ICD-10-CM | POA: Diagnosis not present

## 2019-04-28 DIAGNOSIS — E7800 Pure hypercholesterolemia, unspecified: Secondary | ICD-10-CM | POA: Diagnosis not present

## 2019-04-28 DIAGNOSIS — K219 Gastro-esophageal reflux disease without esophagitis: Secondary | ICD-10-CM | POA: Diagnosis not present

## 2019-04-28 DIAGNOSIS — R079 Chest pain, unspecified: Secondary | ICD-10-CM | POA: Diagnosis not present

## 2019-04-28 DIAGNOSIS — R0789 Other chest pain: Secondary | ICD-10-CM | POA: Diagnosis not present

## 2019-04-28 DIAGNOSIS — F319 Bipolar disorder, unspecified: Secondary | ICD-10-CM | POA: Diagnosis not present

## 2019-04-28 DIAGNOSIS — Z7982 Long term (current) use of aspirin: Secondary | ICD-10-CM | POA: Diagnosis not present

## 2019-05-07 DIAGNOSIS — F319 Bipolar disorder, unspecified: Secondary | ICD-10-CM | POA: Diagnosis not present

## 2019-05-07 DIAGNOSIS — R079 Chest pain, unspecified: Secondary | ICD-10-CM | POA: Diagnosis not present

## 2019-05-07 DIAGNOSIS — Z880 Allergy status to penicillin: Secondary | ICD-10-CM | POA: Diagnosis not present

## 2019-05-07 DIAGNOSIS — F419 Anxiety disorder, unspecified: Secondary | ICD-10-CM | POA: Diagnosis not present

## 2019-05-07 DIAGNOSIS — K219 Gastro-esophageal reflux disease without esophagitis: Secondary | ICD-10-CM | POA: Diagnosis not present

## 2019-05-07 DIAGNOSIS — Z79899 Other long term (current) drug therapy: Secondary | ICD-10-CM | POA: Diagnosis not present

## 2019-05-07 DIAGNOSIS — R0789 Other chest pain: Secondary | ICD-10-CM | POA: Diagnosis not present

## 2019-05-07 DIAGNOSIS — E7800 Pure hypercholesterolemia, unspecified: Secondary | ICD-10-CM | POA: Diagnosis not present

## 2019-05-13 ENCOUNTER — Ambulatory Visit (INDEPENDENT_AMBULATORY_CARE_PROVIDER_SITE_OTHER): Payer: Medicare Other | Admitting: Family Medicine

## 2019-05-13 ENCOUNTER — Telehealth: Payer: Self-pay | Admitting: Family Medicine

## 2019-05-13 ENCOUNTER — Encounter: Payer: Self-pay | Admitting: Family Medicine

## 2019-05-13 DIAGNOSIS — F411 Generalized anxiety disorder: Secondary | ICD-10-CM | POA: Diagnosis not present

## 2019-05-13 DIAGNOSIS — F410 Panic disorder [episodic paroxysmal anxiety] without agoraphobia: Secondary | ICD-10-CM

## 2019-05-13 MED ORDER — BUSPIRONE HCL 7.5 MG PO TABS: 7.5000 mg | ORAL_TABLET | Freq: Three times a day (TID) | ORAL | 5 refills | Status: AC
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
## 2019-05-13 NOTE — Telephone Encounter (Signed)
Patient wanted to clarify dose on meds and make sure that he had refills. Instructed patient on dose and also advised that RFs were sent to pharmacy. Patient verbalized understanding

## 2019-05-23 ENCOUNTER — Telehealth: Payer: Self-pay | Admitting: Family Medicine

## 2019-05-23 DIAGNOSIS — I10 Essential (primary) hypertension: Secondary | ICD-10-CM | POA: Diagnosis not present

## 2019-05-23 DIAGNOSIS — E7800 Pure hypercholesterolemia, unspecified: Secondary | ICD-10-CM | POA: Diagnosis not present

## 2019-05-23 DIAGNOSIS — Z79899 Other long term (current) drug therapy: Secondary | ICD-10-CM | POA: Diagnosis not present

## 2019-05-23 DIAGNOSIS — Z880 Allergy status to penicillin: Secondary | ICD-10-CM | POA: Diagnosis not present

## 2019-05-23 DIAGNOSIS — R0789 Other chest pain: Secondary | ICD-10-CM | POA: Diagnosis not present

## 2019-05-23 DIAGNOSIS — R079 Chest pain, unspecified: Secondary | ICD-10-CM | POA: Diagnosis not present

## 2019-05-23 DIAGNOSIS — F319 Bipolar disorder, unspecified: Secondary | ICD-10-CM | POA: Diagnosis not present

## 2019-05-23 NOTE — Telephone Encounter (Signed)
Talked to triage nurse 

## 2019-05-28 ENCOUNTER — Encounter (HOSPITAL_COMMUNITY): Payer: Self-pay

## 2019-05-28 ENCOUNTER — Emergency Department (HOSPITAL_COMMUNITY)
Admission: EM | Admit: 2019-05-28 | Discharge: 2019-05-28 | Disposition: A | Discharge status: Home / Self Care | Payer: Medicare Other | Attending: Emergency Medicine | Admitting: Emergency Medicine

## 2019-05-28 ENCOUNTER — Other Ambulatory Visit: Payer: Self-pay

## 2019-05-28 DIAGNOSIS — I10 Essential (primary) hypertension: Secondary | ICD-10-CM | POA: Diagnosis not present

## 2019-05-28 DIAGNOSIS — Z79899 Other long term (current) drug therapy: Secondary | ICD-10-CM | POA: Insufficient documentation

## 2019-05-28 DIAGNOSIS — R079 Chest pain, unspecified: Secondary | ICD-10-CM | POA: Diagnosis present

## 2019-05-28 DIAGNOSIS — R072 Precordial pain: Secondary | ICD-10-CM | POA: Insufficient documentation

## 2019-05-28 LAB — CBC WITH DIFFERENTIAL/PLATELET
Abs Immature Granulocytes: 0.02 10*3/uL (ref 0.00–0.07)
Basophils Absolute: 0.1 10*3/uL (ref 0.0–0.1)
Basophils Relative: 1 %
Eosinophils Absolute: 0.2 10*3/uL (ref 0.0–0.5)
Eosinophils Relative: 2 %
HCT: 41.9 % (ref 39.0–52.0)
Hemoglobin: 13.8 g/dL (ref 13.0–17.0)
Immature Granulocytes: 0 %
Lymphocytes Relative: 37 %
Lymphs Abs: 2.8 10*3/uL (ref 0.7–4.0)
MCH: 29.7 pg (ref 26.0–34.0)
MCHC: 32.9 g/dL (ref 30.0–36.0)
MCV: 90.3 fL (ref 80.0–100.0)
Monocytes Absolute: 0.6 10*3/uL (ref 0.1–1.0)
Monocytes Relative: 9 %
Neutro Abs: 3.8 10*3/uL (ref 1.7–7.7)
Neutrophils Relative %: 51 %
Platelets: 328 10*3/uL (ref 150–400)
RBC: 4.64 MIL/uL (ref 4.22–5.81)
RDW: 12 % (ref 11.5–15.5)
WBC: 7.4 10*3/uL (ref 4.0–10.5)
nRBC: 0 % (ref 0.0–0.2)

## 2019-05-28 LAB — BASIC METABOLIC PANEL
Anion gap: 6 (ref 5–15)
BUN: 13 mg/dL (ref 6–20)
CO2: 24 mmol/L (ref 22–32)
Calcium: 8.7 mg/dL — ABNORMAL LOW (ref 8.9–10.3)
Chloride: 106 mmol/L (ref 98–111)
Creatinine, Ser: 0.78 mg/dL (ref 0.61–1.24)
GFR calc Af Amer: >60 mL/min (ref >60)
GFR calc non Af Amer: >60 mL/min (ref >60)
Glucose, Bld: 125 mg/dL — ABNORMAL HIGH (ref 70–99)
Potassium: 3.6 mmol/L (ref 3.5–5.1)
Sodium: 136 mmol/L (ref 135–145)

## 2019-05-28 LAB — TROPONIN I (HIGH SENSITIVITY): Troponin I (High Sensitivity): 5 ng/L (ref <18)

## 2019-05-28 MED ORDER — IBUPROFEN 400 MG PO TABS
400.0000 mg | ORAL_TABLET | Freq: Once | ORAL | Status: AC
  Administered 2019-05-28: 400 mg via ORAL
  Filled 2019-05-28: qty 1
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
## 2019-05-28 NOTE — Discharge Instructions (Addendum)

## 2019-05-28 NOTE — ED Triage Notes (Signed)
Pt with recurrent and frequent chest pain, this episode x 4 days. Pt went to [facility] 2 days ago for same, told it was his anxiety.

## 2019-05-31 ENCOUNTER — Ambulatory Visit (INDEPENDENT_AMBULATORY_CARE_PROVIDER_SITE_OTHER): Payer: Medicare Other | Admitting: Family Medicine

## 2019-05-31 ENCOUNTER — Encounter: Payer: Self-pay | Admitting: Family Medicine

## 2019-05-31 DIAGNOSIS — F410 Panic disorder [episodic paroxysmal anxiety] without agoraphobia: Secondary | ICD-10-CM

## 2019-05-31 DIAGNOSIS — F3175 Bipolar disorder, in partial remission, most recent episode depressed: Secondary | ICD-10-CM | POA: Diagnosis not present

## 2019-05-31 DIAGNOSIS — F411 Generalized anxiety disorder: Secondary | ICD-10-CM | POA: Diagnosis not present
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
## 2019-06-06 ENCOUNTER — Ambulatory Visit: Payer: Medicare Other | Admitting: Family Medicine

## 2019-06-16 ENCOUNTER — Telehealth: Payer: Self-pay | Admitting: Family Medicine

## 2019-06-16 DIAGNOSIS — R0789 Other chest pain: Secondary | ICD-10-CM | POA: Diagnosis not present

## 2019-06-16 DIAGNOSIS — Z79899 Other long term (current) drug therapy: Secondary | ICD-10-CM | POA: Diagnosis not present

## 2019-06-16 DIAGNOSIS — E7800 Pure hypercholesterolemia, unspecified: Secondary | ICD-10-CM | POA: Diagnosis not present

## 2019-06-16 DIAGNOSIS — F319 Bipolar disorder, unspecified: Secondary | ICD-10-CM | POA: Diagnosis not present

## 2019-06-16 DIAGNOSIS — Z880 Allergy status to penicillin: Secondary | ICD-10-CM | POA: Diagnosis not present

## 2019-06-16 DIAGNOSIS — I10 Essential (primary) hypertension: Secondary | ICD-10-CM | POA: Diagnosis not present

## 2019-06-16 NOTE — Telephone Encounter (Signed)
Talked to triage 

## 2019-06-16 NOTE — Telephone Encounter (Signed)
Patient called c/o of chest pain with SOB and tightness in chest since 3am this morning. He states when goes into the ED they just tell him it is anxiety. He states this chest pain feels different than before. Patient advised to call 911 and have EMS come out and evaluate patient. FYI

## 2019-06-16 NOTE — Telephone Encounter (Signed)
I agree, if pt is having chest pain that is not resolving, he needs to go to the ED.

## 2019-06-21 ENCOUNTER — Other Ambulatory Visit: Payer: Self-pay

## 2019-06-22 ENCOUNTER — Ambulatory Visit: Payer: Medicare Other | Admitting: Family Medicine

## 2019-06-27 ENCOUNTER — Emergency Department (HOSPITAL_COMMUNITY)
Admission: EM | Admit: 2019-06-27 | Discharge: 2019-06-28 | Disposition: A | Discharge status: Home / Self Care | Payer: Medicare Other | Attending: Emergency Medicine | Admitting: Emergency Medicine

## 2019-06-27 ENCOUNTER — Encounter (HOSPITAL_COMMUNITY): Payer: Self-pay | Admitting: *Deleted

## 2019-06-27 ENCOUNTER — Emergency Department (HOSPITAL_COMMUNITY): Payer: Medicare Other

## 2019-06-27 ENCOUNTER — Other Ambulatory Visit: Payer: Self-pay

## 2019-06-27 DIAGNOSIS — I10 Essential (primary) hypertension: Secondary | ICD-10-CM | POA: Diagnosis not present

## 2019-06-27 DIAGNOSIS — Z79899 Other long term (current) drug therapy: Secondary | ICD-10-CM | POA: Diagnosis not present

## 2019-06-27 DIAGNOSIS — R072 Precordial pain: Secondary | ICD-10-CM | POA: Diagnosis not present

## 2019-06-27 DIAGNOSIS — R079 Chest pain, unspecified: Secondary | ICD-10-CM | POA: Diagnosis present

## 2019-06-27 LAB — CBC
HCT: 45.8 % (ref 39.0–52.0)
Hemoglobin: 15.2 g/dL (ref 13.0–17.0)
MCH: 30 pg (ref 26.0–34.0)
MCHC: 33.2 g/dL (ref 30.0–36.0)
MCV: 90.5 fL (ref 80.0–100.0)
Platelets: 352 10*3/uL (ref 150–400)
RBC: 5.06 MIL/uL (ref 4.22–5.81)
RDW: 12.3 % (ref 11.5–15.5)
WBC: 8.9 10*3/uL (ref 4.0–10.5)
nRBC: 0 % (ref 0.0–0.2)

## 2019-06-27 LAB — BASIC METABOLIC PANEL
Anion gap: 14 (ref 5–15)
BUN: 11 mg/dL (ref 6–20)
CO2: 22 mmol/L (ref 22–32)
Calcium: 9.2 mg/dL (ref 8.9–10.3)
Chloride: 98 mmol/L (ref 98–111)
Creatinine, Ser: 0.77 mg/dL (ref 0.61–1.24)
GFR calc Af Amer: >60 mL/min (ref >60)
GFR calc non Af Amer: >60 mL/min (ref >60)
Glucose, Bld: 98 mg/dL (ref 70–99)
Potassium: 3.8 mmol/L (ref 3.5–5.1)
Sodium: 134 mmol/L — ABNORMAL LOW (ref 135–145)

## 2019-06-27 LAB — TROPONIN I (HIGH SENSITIVITY)
Troponin I (High Sensitivity): 4 ng/L (ref <18)
Troponin I (High Sensitivity): 3 ng/L (ref <18)

## 2019-06-27 MED ORDER — SODIUM CHLORIDE 0.9% FLUSH: 3.0000 mL | Freq: Once | INTRAVENOUS | Status: DC

## 2019-06-27 MED ORDER — IBUPROFEN 400 MG PO TABS
400.0000 mg | ORAL_TABLET | Freq: Once | ORAL | Status: AC
  Administered 2019-06-28: 400 mg via ORAL
  Filled 2019-06-27: qty 1

## 2019-06-27 NOTE — ED Triage Notes (Signed)
Pt states he started having left side chest pain last night while watching tv; pt states he called ems and they came out and examined pt and took an ekg and told pt it looked normal; pt decided to stay home and called his pcp today and has an appointment tomorrow but pt states the pain has not let up

## 2019-06-28 ENCOUNTER — Encounter: Payer: Self-pay | Admitting: Family

## 2019-06-28 ENCOUNTER — Ambulatory Visit (INDEPENDENT_AMBULATORY_CARE_PROVIDER_SITE_OTHER): Payer: Medicare Other | Admitting: Family

## 2019-06-28 DIAGNOSIS — Z09 Encounter for follow-up examination after completed treatment for conditions other than malignant neoplasm: Secondary | ICD-10-CM

## 2019-06-28 DIAGNOSIS — F410 Panic disorder [episodic paroxysmal anxiety] without agoraphobia: Secondary | ICD-10-CM | POA: Diagnosis not present

## 2019-06-28 DIAGNOSIS — F411 Generalized anxiety disorder: Secondary | ICD-10-CM | POA: Diagnosis not present

## 2019-06-28 DIAGNOSIS — R072 Precordial pain: Secondary | ICD-10-CM | POA: Diagnosis not present

## 2019-06-28 DIAGNOSIS — R079 Chest pain, unspecified: Secondary | ICD-10-CM

## 2019-06-28 DIAGNOSIS — F329 Major depressive disorder, single episode, unspecified: Secondary | ICD-10-CM | POA: Diagnosis not present

## 2019-06-28 DIAGNOSIS — F32A Depression, unspecified: Secondary | ICD-10-CM

## 2019-06-28 MED ORDER — BUSPIRONE HCL 10 MG PO TABS: 10.0000 mg | ORAL_TABLET | Freq: Three times a day (TID) | ORAL | 3 refills | Status: DC

## 2019-06-28 MED ORDER — VENLAFAXINE HCL ER 37.5 MG PO CP24: ORAL_CAPSULE | ORAL | 1 refills | Status: DC
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
## 2019-06-28 NOTE — Progress Notes (Signed)
Virtual Visit via telephone Note Due to COVID-19 pandemic this visit was conducted virtually. This visit type was conducted due to national recommendations for restrictions regarding the COVID-19 Pandemic (e.g. social distancing, sheltering in place) in an effort to limit this patient's exposure and mitigate transmission in our community. All issues noted in this document were discussed and addressed.  A physical exam was not performed with this format.  I connected with Preston Berry on 06/28/19 at 2:23 pm  by telephone and verified that I am speaking with the correct person using two identifiers. Preston Berry is currently located at sister's house and sisters & dAD is currently with HIM during visit. The provider, Evelina Dun, FNP is located in their office at time of visit.  I discussed the limitations, risks, security and privacy concerns of performing an evaluation and management service by telephone and the availability of in person appointments. I also discussed with the patient that there may be a patient responsible charge related to this service. The patient expressed understanding and agreed to proceed.   History and Present Illness:  Pt calls the office today for recurrent chest pain. He has been to the ED several times for this and Cardiologists. He was told his heart was ok and this pain was more than likely related to anxiety.  Chest Pain  This is a chronic problem. The current episode started more than 1 year ago. The onset quality is gradual. The problem occurs intermittently. The problem has been waxing and waning. The pain is present in the substernal region. Associated symptoms include malaise/fatigue and palpitations. Pertinent negatives include no claudication, cough, diaphoresis, dizziness, exertional chest pressure or headaches.  Anxiety Presents for follow-up visit. Symptoms include chest pain, decreased concentration, depressed mood, excessive worry, irritability,  nervous/anxious behavior, palpitations, panic and restlessness. Patient reports no dizziness or suicidal ideas. Symptoms occur most days. The severity of symptoms is moderate.        Review of Systems  Constitutional: Positive for irritability and malaise/fatigue. Negative for diaphoresis.  Respiratory: Negative for cough.   Cardiovascular: Positive for chest pain and palpitations. Negative for claudication.  Neurological: Negative for dizziness and headaches.  Psychiatric/Behavioral: Positive for decreased concentration. Negative for suicidal ideas. The patient is nervous/anxious.      Observations/Objective: No SOB or distress noted, anxiousness noted  Assessment and Plan: 1. GAD (generalized anxiety disorder) - venlafaxine XR (EFFEXOR XR) 37.5 MG 24 hr capsule; Take 1 capsule (37.5 mg total) by mouth daily with breakfast for 14 days, THEN 2 capsules (75 mg total) daily with breakfast for 14 days.  Dispense: 42 capsule; Refill: 1 - Ambulatory referral to Psychiatry - busPIRone (BUSPAR) 10 MG tablet; Take 1 tablet (10 mg total) by mouth 3 (three) times daily.  Dispense: 90 tablet; Refill: 3  2. Panic attacks - venlafaxine XR (EFFEXOR XR) 37.5 MG 24 hr capsule; Take 1 capsule (37.5 mg total) by mouth daily with breakfast for 14 days, THEN 2 capsules (75 mg total) daily with breakfast for 14 days.  Dispense: 42 capsule; Refill: 1 - Ambulatory referral to Psychiatry - busPIRone (BUSPAR) 10 MG tablet; Take 1 tablet (10 mg total) by mouth 3 (three) times daily.  Dispense: 90 tablet; Refill: 3  3. Depression, unspecified depression type - venlafaxine XR (EFFEXOR XR) 37.5 MG 24 hr capsule; Take 1 capsule (37.5 mg total) by mouth daily with breakfast for 14 days, THEN 2 capsules (75 mg total) daily with breakfast for 14 days.  Dispense:  42 capsule; Refill: 1 - Ambulatory referral to Psychiatry - busPIRone (BUSPAR) 10 MG tablet; Take 1 tablet (10 mg total) by mouth 3 (three) times daily.   Dispense: 90 tablet; Refill: 3  4. Chest pain, unspecified type  5. Hospital discharge follow-up  Will start Effexor today and tamper up in the next two weeks. Will increase Buspar to 10 mg from 7.5 mg TID Referral pending to [facility] management discussed Keep follow up with PCP Hospital notes reviewed      I discussed the assessment and treatment plan with the patient. The patient was provided an opportunity to ask questions and all were answered. The patient agreed with the plan and demonstrated an understanding of the instructions.   The patient was advised to call back or seek an in-person evaluation if the symptoms worsen or if the condition fails to improve as anticipated.  The above assessment and management plan was discussed with the patient. The patient verbalized understanding of and has agreed to the management plan. Patient is aware to call the clinic if symptoms persist or worsen. Patient is aware when to return to the clinic for a follow-up visit. Patient educated on when it is appropriate to go to the emergency department.   Time call ended:  2:45 pm  I provided 22 minutes of non-face-to-face time during this encounter.    Evelina Dun, FNP

## 2019-07-03 DIAGNOSIS — R079 Chest pain, unspecified: Secondary | ICD-10-CM | POA: Diagnosis not present

## 2019-07-04 ENCOUNTER — Telehealth: Payer: Self-pay | Admitting: Family Medicine

## 2019-07-04 ENCOUNTER — Other Ambulatory Visit: Payer: Self-pay | Admitting: Family Medicine

## 2019-07-04 DIAGNOSIS — R079 Chest pain, unspecified: Secondary | ICD-10-CM

## 2019-07-04 DIAGNOSIS — R9431 Abnormal electrocardiogram [ECG] [EKG]: Secondary | ICD-10-CM

## 2019-07-04 DIAGNOSIS — I10 Essential (primary) hypertension: Secondary | ICD-10-CM

## 2019-07-04 NOTE — Telephone Encounter (Signed)
REFERRAL REQUEST Telephone Note  What type of referral do you need? Cardiologist  Have you been seen at our office for this problem? No he was seen at [facility] for [phone] he thinks that venlafaxine XR (EFFEXOR XR) 37.5 MG 24 hr capsule  is what caused it? (Advise that they will likely need an appointment with their PCP before a referral can be done)  Is there a particular doctor or location that you prefer? Would like to go somewhere in [place]  Patient notified that referrals can take up to a week or longer to process. If they haven't heard anything within a week they should call back and speak with the referral department.

## 2019-07-04 NOTE — Telephone Encounter (Signed)
He has seen cardiology and should have a referral in place.

## 2019-07-04 NOTE — Telephone Encounter (Signed)
DASH diet. Report any persistent high or low readings and follow up in office for evaluation.

## 2019-07-04 NOTE — Telephone Encounter (Signed)
I Called the Office where he was seen in September and they stated he would need a new Ref & he would like to go to the [place] Location this time.

## 2019-07-04 NOTE — Telephone Encounter (Signed)
New referral placed. Thanks

## 2019-07-04 NOTE — Telephone Encounter (Signed)
Patient aware and verbalized understanding. °

## 2019-07-04 NOTE — Telephone Encounter (Signed)
Patient was seen at ER 12/21 for chest pain.  Had a tele visit with [facility] 12/22 .  Patient was put on effexor and his buspar was increased to 10mg .  Patient states that before that change his bp was running good- only reading he has is 130/74.  States that since the medication change his bp has elevated- 153/77, 143/90 & 158/80.  Patient is going to keep a log of his bp .
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
## 2019-07-06 DIAGNOSIS — R079 Chest pain, unspecified: Secondary | ICD-10-CM | POA: Diagnosis not present

## 2019-07-11 ENCOUNTER — Ambulatory Visit: Payer: Medicare Other | Admitting: Family Medicine

## 2019-07-11 ENCOUNTER — Ambulatory Visit (INDEPENDENT_AMBULATORY_CARE_PROVIDER_SITE_OTHER): Payer: Medicare Other | Admitting: Cardiovascular Disease

## 2019-07-11 ENCOUNTER — Other Ambulatory Visit: Payer: Self-pay

## 2019-07-11 ENCOUNTER — Encounter: Payer: Self-pay | Admitting: Cardiovascular Disease

## 2019-07-11 VITALS — BP 131/88 | HR 94 | Temp 97.8°F | Ht 68.0 in | Wt 229.0 lb

## 2019-07-11 DIAGNOSIS — R079 Chest pain, unspecified: Secondary | ICD-10-CM | POA: Diagnosis not present

## 2019-07-11 DIAGNOSIS — I10 Essential (primary) hypertension: Secondary | ICD-10-CM

## 2019-07-11 DIAGNOSIS — F411 Generalized anxiety disorder: Secondary | ICD-10-CM | POA: Diagnosis not present

## 2019-07-11 DIAGNOSIS — E785 Hyperlipidemia, unspecified: Secondary | ICD-10-CM

## 2019-07-11 NOTE — Patient Instructions (Signed)
Medication Instructions:   Your physician recommends that you continue on your current medications as directed. Please refer to the Current Medication list given to you today.  Labwork:  none  Testing/Procedures:  none  Follow-Up:  Your physician recommends that you schedule a follow-up appointment in: as needed.  Any Other Special Instructions Will Be Listed Below (If Applicable).  If you need a refill on your cardiac medications before your next appointment, please call your pharmacy. 

## 2019-07-12 ENCOUNTER — Encounter: Payer: Self-pay | Admitting: Family Medicine

## 2019-07-20 ENCOUNTER — Encounter: Payer: Self-pay | Admitting: Family Medicine

## 2019-07-20 ENCOUNTER — Ambulatory Visit (INDEPENDENT_AMBULATORY_CARE_PROVIDER_SITE_OTHER): Payer: Medicare Other | Admitting: Family Medicine

## 2019-07-20 DIAGNOSIS — F3175 Bipolar disorder, in partial remission, most recent episode depressed: Secondary | ICD-10-CM

## 2019-07-20 DIAGNOSIS — K219 Gastro-esophageal reflux disease without esophagitis: Secondary | ICD-10-CM

## 2019-07-20 DIAGNOSIS — F410 Panic disorder [episodic paroxysmal anxiety] without agoraphobia: Secondary | ICD-10-CM | POA: Diagnosis not present

## 2019-07-20 DIAGNOSIS — F339 Major depressive disorder, recurrent, unspecified: Secondary | ICD-10-CM | POA: Diagnosis not present

## 2019-07-20 MED ORDER — PANTOPRAZOLE SODIUM 40 MG PO TBEC: 40.0000 mg | DELAYED_RELEASE_TABLET | Freq: Every day | ORAL | 3 refills | Status: DC
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
## 2019-08-02 DIAGNOSIS — F909 Attention-deficit hyperactivity disorder, unspecified type: Secondary | ICD-10-CM | POA: Diagnosis not present

## 2019-08-02 DIAGNOSIS — R0789 Other chest pain: Secondary | ICD-10-CM | POA: Diagnosis not present

## 2019-08-02 DIAGNOSIS — R079 Chest pain, unspecified: Secondary | ICD-10-CM | POA: Diagnosis not present

## 2019-08-02 DIAGNOSIS — Z7982 Long term (current) use of aspirin: Secondary | ICD-10-CM | POA: Diagnosis not present

## 2019-08-02 DIAGNOSIS — F319 Bipolar disorder, unspecified: Secondary | ICD-10-CM | POA: Diagnosis not present

## 2019-08-02 DIAGNOSIS — Z79899 Other long term (current) drug therapy: Secondary | ICD-10-CM | POA: Diagnosis not present

## 2019-08-02 DIAGNOSIS — Z881 Allergy status to other antibiotic agents status: Secondary | ICD-10-CM | POA: Diagnosis not present

## 2019-08-02 DIAGNOSIS — Z888 Allergy status to other drugs, medicaments and biological substances status: Secondary | ICD-10-CM | POA: Diagnosis not present

## 2019-08-02 DIAGNOSIS — E7800 Pure hypercholesterolemia, unspecified: Secondary | ICD-10-CM | POA: Diagnosis not present

## 2019-08-02 DIAGNOSIS — R0781 Pleurodynia: Secondary | ICD-10-CM | POA: Diagnosis not present

## 2019-08-02 DIAGNOSIS — K219 Gastro-esophageal reflux disease without esophagitis: Secondary | ICD-10-CM | POA: Diagnosis not present

## 2019-08-02 DIAGNOSIS — Z880 Allergy status to penicillin: Secondary | ICD-10-CM | POA: Diagnosis not present

## 2019-08-02 DIAGNOSIS — M940 Chondrocostal junction syndrome [Tietze]: Secondary | ICD-10-CM | POA: Diagnosis not present

## 2019-08-02 DIAGNOSIS — R202 Paresthesia of skin: Secondary | ICD-10-CM | POA: Diagnosis not present

## 2019-08-04 ENCOUNTER — Encounter: Payer: Self-pay | Admitting: Family Medicine

## 2019-08-04 ENCOUNTER — Ambulatory Visit (INDEPENDENT_AMBULATORY_CARE_PROVIDER_SITE_OTHER): Payer: Medicare Other | Admitting: Family Medicine

## 2019-08-04 ENCOUNTER — Other Ambulatory Visit: Payer: Self-pay

## 2019-08-04 VITALS — BP 127/79 | HR 84 | Temp 98.7°F | Ht 68.0 in | Wt 230.4 lb

## 2019-08-04 DIAGNOSIS — G5622 Lesion of ulnar nerve, left upper limb: Secondary | ICD-10-CM

## 2019-08-04 DIAGNOSIS — F410 Panic disorder [episodic paroxysmal anxiety] without agoraphobia: Secondary | ICD-10-CM

## 2019-08-04 NOTE — Progress Notes (Signed)
BP 127/79   Pulse 84   Temp 98.7 F (37.1 C) (Temporal)   Ht 5\' 8"  (1.727 m)   Wt 230 lb 6.4 oz (104.5 kg)   SpO2 94%   BMI 35.03 kg/m    Subjective:   Patient ID: Preston Berry, male    DOB: [birth year], 34 y.o.   MRN: [id number]  HPI: Preston Berry is a 34 y.o. male presenting on 08/04/2019 for [facility] follow up (Patient states that he has been having chest pain and anxiety ongoing.) and Numbness (left hand )   HPI Patient comes in today with complaints of chest tightness and pain which she has had from panic attacks but on top of that he describes a numbness on the outside of his left hand and he said it was tingling and may have turned blue prior to going to the emergency department.  He went to the [facility] emergency department a few days ago and they did an evaluation and x-ray and a CT scan and did not find anything wrong, we will request those records but I cannot see them today.  Patient status he is still feeling the numbness going down his left pinky finger and hand and the outside of his arm on that side.  He does admit he has been sitting down more and playing video games and leaning on that arm on a hard wooden chair.  Relevant past medical, surgical, family and social history reviewed and updated as indicated. Interim medical history since our last visit reviewed. Allergies and medications reviewed and updated.  Review of Systems  Constitutional: Negative for chills and fever.  Respiratory: Negative for shortness of breath and wheezing.   Cardiovascular: Negative for chest pain and leg swelling.  Musculoskeletal: Positive for arthralgias. Negative for back pain and gait problem.  Skin: Negative for rash.  Neurological: Positive for numbness. Negative for weakness.  All other systems reviewed and are negative.   Per HPI unless specifically indicated above   Allergies as of 08/04/2019      Reactions   Penicillins Anaphylaxis, Swelling   Did it involve  swelling of the face/tongue/throat, SOB, or low BP? Yes Did it involve sudden or severe rash/hives, skin peeling, or any reaction on the inside of your mouth or nose? No Did you need to seek medical attention at a hospital or doctor's office? unknown When did it last happen?over 15 years If all above answers are "NO", may proceed with cephalosporin use.   Keflex [cephalexin]    Patient states he is allergic to cephlasporins. Unknown reaction      Medication List       Accurate as of August 04, 2019  4:47 PM. If you have any questions, ask your nurse or doctor.        STOP taking these medications   venlafaxine XR 37.5 MG 24 hr capsule Commonly known as: Effexor XR Stopped by: Fransisca Kaufmann Jolicia Delira, MD     TAKE these medications   atorvastatin 20 MG tablet Commonly known as: LIPITOR Take 20 mg by mouth daily.   busPIRone 10 MG tablet Commonly known as: BUSPAR Take 1 tablet (10 mg total) by mouth 3 (three) times daily.   ibuprofen 400 MG tablet Commonly known as: ADVIL Take 400 mg by mouth every 8 (eight) hours as needed for pain.   lisinopril 5 MG tablet Commonly known as: ZESTRIL Take 5 mg by mouth daily.   pantoprazole 40 MG tablet Commonly known as:  PROTONIX Take 1 tablet (40 mg total) by mouth daily.        Objective:   BP 127/79   Pulse 84   Temp 98.7 F (37.1 C) (Temporal)   Ht 5\' 8"  (1.727 m)   Wt 230 lb 6.4 oz (104.5 kg)   SpO2 94%   BMI 35.03 kg/m   Wt Readings from Last 3 Encounters:  08/04/19 230 lb 6.4 oz (104.5 kg)  07/11/19 229 lb (103.9 kg)  06/27/19 220 lb (99.8 kg)    Physical Exam Vitals and nursing note reviewed.  Constitutional:      General: He is not in acute distress.    Appearance: He is well-developed. He is not diaphoretic.  Eyes:     General: No scleral icterus.    Conjunctiva/sclera: Conjunctivae normal.  Neck:     Thyroid: No thyromegaly.  Musculoskeletal:        General: Normal range of motion.     Cervical back:  No rigidity or tenderness.     Comments: Patient is positive for pain and shooting from the medial epicondylar region overlying the ulnar nerve consistent with ulnar nerve irritation or entrapment.  Skin:    General: Skin is warm and dry.     Findings: No rash.  Neurological:     Mental Status: He is alert and oriented to person, place, and time.     Coordination: Coordination normal.  Psychiatric:        Behavior: Behavior normal.       Assessment & Plan:   Problem List Items Addressed This Visit      Other   Panic attacks    Other Visit Diagnoses    Ulnar nerve impingement, left    -  Primary   Recommended ibuprofen, if the ibuprofen does not work over the next 5 days to reduce the inflammation and help the numbness go away then may consider prednisone      Patient will follow up with PCP for panic attacks and anxiety, is also try to get psychiatry Follow up plan: Return if symptoms worsen or fail to improve.  Counseling provided for all of the vaccine components No orders of the defined types were placed in this encounter.   Caryl Pina, MD [facility] 08/04/2019, 4:47 PM

## 2019-08-09 ENCOUNTER — Other Ambulatory Visit: Payer: Self-pay

## 2019-08-10 ENCOUNTER — Ambulatory Visit: Payer: Medicare Other | Admitting: Family Medicine

## 2019-08-16 ENCOUNTER — Encounter: Payer: Self-pay | Admitting: Family Medicine

## 2019-08-16 ENCOUNTER — Ambulatory Visit (INDEPENDENT_AMBULATORY_CARE_PROVIDER_SITE_OTHER): Payer: Medicare Other | Admitting: Family Medicine

## 2019-08-16 DIAGNOSIS — H60503 Unspecified acute noninfective otitis externa, bilateral: Secondary | ICD-10-CM

## 2019-08-16 MED ORDER — CIPROFLOXACIN-DEXAMETHASONE 0.3-0.1 % OT SUSP: 4.0000 [drp] | Freq: Two times a day (BID) | OTIC | 0 refills | Status: AC

## 2019-08-16 NOTE — Progress Notes (Signed)
Virtual Visit via Telephone Note  I connected with Preston Berry on 08/16/19 at 3:21 PM by telephone and verified that I am speaking with the correct person using two identifiers. Preston Berry is currently located at home and nobody is currently with him during this visit. The provider, Loman Brooklyn, FNP is located in their office at time of visit.  I discussed the limitations, risks, security and privacy concerns of performing an evaluation and management service by telephone and the availability of in person appointments. I also discussed with the patient that there may be a patient responsible charge related to this service. The patient expressed understanding and agreed to proceed.  Subjective: PCP: Baruch Gouty, FNP  Chief Complaint  Patient presents with  . Otitis Media   Ear Pain: Patient presents with bilateral ear pain.  Symptoms include bilateral ear drainage, swelling, and muffled hearing. Symptoms began 1 day ago and are unchanged since that time.  Patient denies any trauma to the head or ears.  Also endorses irritation/itching when wiggling his ears.   ROS: Per HPI  Current Outpatient Medications:  .  atorvastatin (LIPITOR) 20 MG tablet, Take 20 mg by mouth daily., Disp: , Rfl:  .  busPIRone (BUSPAR) 10 MG tablet, Take 1 tablet (10 mg total) by mouth 3 (three) times daily., Disp: 90 tablet, Rfl: 3 .  ibuprofen (ADVIL) 400 MG tablet, Take 400 mg by mouth every 8 (eight) hours as needed for pain., Disp: , Rfl:  .  lisinopril (ZESTRIL) 5 MG tablet, Take 5 mg by mouth daily., Disp: , Rfl:  .  pantoprazole (PROTONIX) 40 MG tablet, Take 1 tablet (40 mg total) by mouth daily., Disp: 90 tablet, Rfl: 3  Allergies  Allergen Reactions  . Penicillins Anaphylaxis and Swelling    Did it involve swelling of the face/tongue/throat, SOB, or low BP? Yes Did it involve sudden or severe rash/hives, skin peeling, or any reaction on the inside of your mouth or nose? No Did you need  to seek medical attention at a hospital or doctor's office? unknown When did it last happen?over 15 years If all above answers are "NO", may proceed with cephalosporin use.   Marland Kitchen Keflex [Cephalexin]     Patient states he is allergic to cephlasporins. Unknown reaction   Past Medical History:  Diagnosis Date  . Abdominal pain 02/19/2015  . Abnormal EKG 01/27/2019  . Arthritis   . Bipolar disorder ([place]) 04/10/2014  . Chest pain, rule out acute myocardial infarction 12/28/2018  . Colitis   . Constipation 02/08/2019  . Depression   . Eczema 09/18/2015  . Essential hypertension 01/13/2019  . Fatty liver   . GERD (gastroesophageal reflux disease)   . Gynecomastia   . H/O lead exposure 02/19/2015  . Hepatic steatosis   . Hepatomegaly   . Hyperlipidemia   . Hypertension   . LLQ abdominal pain 02/08/2019  . Panic attacks 04/10/2014  . Rectal bleeding 02/08/2019  . Transaminitis 02/19/2015    Observations/Objective: A&O  No respiratory distress or wheezing audible over the phone Mood, judgement, and thought processes all WNL  Assessment and Plan: 1. Acute otitis externa of both ears, unspecified type - Education provided on otitis externa. - ciprofloxacin-dexamethasone (CIPRODEX) OTIC suspension; Place 4 drops into both ears 2 (two) times daily for 7 days.  Dispense: 7.5 mL; Refill: 0   Follow Up Instructions:  I discussed the assessment and treatment plan with the patient. The patient was provided an opportunity  to ask questions and all were answered. The patient agreed with the plan and demonstrated an understanding of the instructions.   The patient was advised to call back or seek an in-person evaluation if the symptoms worsen or if the condition fails to improve as anticipated.  The above assessment and management plan was discussed with the patient. The patient verbalized understanding of and has agreed to the management plan. Patient is aware to call the clinic if symptoms persist or  worsen. Patient is aware when to return to the clinic for a follow-up visit. Patient educated on when it is appropriate to go to the emergency department.   Time call ended: 3:27 PM  I provided 8 minutes of non-face-to-face time during this encounter.  Hendricks Limes, MSN, APRN, FNP-C [facility] 08/16/19

## 2019-08-16 NOTE — Patient Instructions (Signed)

## 2019-08-28 ENCOUNTER — Encounter (HOSPITAL_COMMUNITY): Payer: Self-pay

## 2019-08-28 ENCOUNTER — Emergency Department (HOSPITAL_COMMUNITY): Payer: Medicare Other

## 2019-08-28 ENCOUNTER — Emergency Department (HOSPITAL_COMMUNITY)
Admission: EM | Admit: 2019-08-28 | Discharge: 2019-08-28 | Disposition: A | Discharge status: Home / Self Care | Payer: Medicare Other | Attending: Emergency Medicine | Admitting: Emergency Medicine

## 2019-08-28 ENCOUNTER — Other Ambulatory Visit: Payer: Self-pay

## 2019-08-28 DIAGNOSIS — R079 Chest pain, unspecified: Secondary | ICD-10-CM

## 2019-08-28 DIAGNOSIS — I10 Essential (primary) hypertension: Secondary | ICD-10-CM | POA: Insufficient documentation

## 2019-08-28 DIAGNOSIS — R0789 Other chest pain: Secondary | ICD-10-CM | POA: Insufficient documentation

## 2019-08-28 DIAGNOSIS — Z79899 Other long term (current) drug therapy: Secondary | ICD-10-CM | POA: Diagnosis not present

## 2019-08-28 DIAGNOSIS — Z20822 Contact with and (suspected) exposure to covid-19: Secondary | ICD-10-CM | POA: Insufficient documentation

## 2019-08-28 LAB — BASIC METABOLIC PANEL
Anion gap: 10 (ref 5–15)
BUN: 10 mg/dL (ref 6–20)
CO2: 25 mmol/L (ref 22–32)
Calcium: 8.9 mg/dL (ref 8.9–10.3)
Chloride: 101 mmol/L (ref 98–111)
Creatinine, Ser: 0.83 mg/dL (ref 0.61–1.24)
GFR calc Af Amer: >60 mL/min (ref >60)
GFR calc non Af Amer: >60 mL/min (ref >60)
Glucose, Bld: 121 mg/dL — ABNORMAL HIGH (ref 70–99)
Potassium: 3.7 mmol/L (ref 3.5–5.1)
Sodium: 136 mmol/L (ref 135–145)

## 2019-08-28 LAB — TROPONIN I (HIGH SENSITIVITY)
Troponin I (High Sensitivity): 6 ng/L (ref <18)
Troponin I (High Sensitivity): 5 ng/L (ref <18)

## 2019-08-28 LAB — HEPATIC FUNCTION PANEL
ALT: 66 U/L — ABNORMAL HIGH (ref 0–44)
AST: 30 U/L (ref 15–41)
Albumin: 4.2 g/dL (ref 3.5–5.0)
Alkaline Phosphatase: 70 U/L (ref 38–126)
Bilirubin, Direct: 0.1 mg/dL (ref 0.0–0.2)
Indirect Bilirubin: 0.5 mg/dL (ref 0.3–0.9)
Total Bilirubin: 0.6 mg/dL (ref 0.3–1.2)
Total Protein: 7.5 g/dL (ref 6.5–8.1)

## 2019-08-28 LAB — DIFFERENTIAL
Abs Immature Granulocytes: 0.01 10*3/uL (ref 0.00–0.07)
Basophils Absolute: 0 10*3/uL (ref 0.0–0.1)
Basophils Relative: 1 %
Eosinophils Absolute: 0.1 10*3/uL (ref 0.0–0.5)
Eosinophils Relative: 2 %
Immature Granulocytes: 0 %
Lymphocytes Relative: 35 %
Lymphs Abs: 2.2 10*3/uL (ref 0.7–4.0)
Monocytes Absolute: 0.6 10*3/uL (ref 0.1–1.0)
Monocytes Relative: 9 %
Neutro Abs: 3.3 10*3/uL (ref 1.7–7.7)
Neutrophils Relative %: 53 %

## 2019-08-28 LAB — CBC
HCT: 44.4 % (ref 39.0–52.0)
Hemoglobin: 14.8 g/dL (ref 13.0–17.0)
MCH: 29.4 pg (ref 26.0–34.0)
MCHC: 33.3 g/dL (ref 30.0–36.0)
MCV: 88.3 fL (ref 80.0–100.0)
Platelets: 319 10*3/uL (ref 150–400)
RBC: 5.03 MIL/uL (ref 4.22–5.81)
RDW: 12.2 % (ref 11.5–15.5)
WBC: 6.2 10*3/uL (ref 4.0–10.5)
nRBC: 0 % (ref 0.0–0.2)

## 2019-08-28 LAB — LIPASE, BLOOD: Lipase: 24 U/L (ref 11–51)

## 2019-08-28 MED ORDER — SODIUM CHLORIDE 0.9 % IV SOLN: Freq: Once | INTRAVENOUS | Status: DC

## 2019-08-28 MED ORDER — PANTOPRAZOLE SODIUM 40 MG IV SOLR
40.0000 mg | Freq: Once | INTRAVENOUS | Status: AC
  Administered 2019-08-28: 40 mg via INTRAVENOUS
  Filled 2019-08-28: qty 40

## 2019-08-28 MED ORDER — MORPHINE SULFATE (PF) 4 MG/ML IV SOLN
4.0000 mg | Freq: Once | INTRAVENOUS | Status: AC
  Administered 2019-08-28: 4 mg via INTRAVENOUS
  Filled 2019-08-28: qty 1

## 2019-08-28 NOTE — ED Triage Notes (Signed)
Pt presents to ED with complaints of left sided chest pain radiates to neck started last night, sob, dizziness.
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
## 2019-08-28 NOTE — Discharge Instructions (Addendum)
Test showed no evidence of a heart attack.  Follow-up with your primary care doctor.

## 2019-08-28 NOTE — ED Provider Notes (Signed)
1920: Recheck.  Hemodynamically stable.  Discussed test results.  Stable for outpatient management.   Nat Christen, MD 08/28/19 [phone]

## 2019-08-29 LAB — SARS CORONAVIRUS 2 (TAT 6-24 HRS): SARS Coronavirus 2: NEGATIVE

## 2019-09-09 ENCOUNTER — Other Ambulatory Visit: Payer: Self-pay

## 2019-09-12 ENCOUNTER — Other Ambulatory Visit: Payer: Self-pay

## 2019-09-12 ENCOUNTER — Encounter: Payer: Self-pay | Admitting: Family Medicine

## 2019-09-12 ENCOUNTER — Ambulatory Visit (INDEPENDENT_AMBULATORY_CARE_PROVIDER_SITE_OTHER): Payer: Medicare Other | Admitting: Family Medicine

## 2019-09-12 VITALS — BP 127/79 | HR 74 | Temp 97.8°F | Resp 20 | Ht 68.0 in | Wt 234.0 lb

## 2019-09-12 DIAGNOSIS — F411 Generalized anxiety disorder: Secondary | ICD-10-CM | POA: Diagnosis not present

## 2019-09-12 DIAGNOSIS — I10 Essential (primary) hypertension: Secondary | ICD-10-CM | POA: Diagnosis not present

## 2019-09-12 DIAGNOSIS — F339 Major depressive disorder, recurrent, unspecified: Secondary | ICD-10-CM | POA: Diagnosis not present

## 2019-09-12 DIAGNOSIS — K219 Gastro-esophageal reflux disease without esophagitis: Secondary | ICD-10-CM | POA: Diagnosis not present

## 2019-09-12 DIAGNOSIS — E782 Mixed hyperlipidemia: Secondary | ICD-10-CM | POA: Diagnosis not present

## 2019-09-12 MED ORDER — FLUOXETINE HCL 20 MG PO TABS: 20.0000 mg | ORAL_TABLET | Freq: Every day | ORAL | 6 refills | Status: DC

## 2019-09-12 MED ORDER — BUSPIRONE HCL 15 MG PO TABS: 15.0000 mg | ORAL_TABLET | Freq: Three times a day (TID) | ORAL | 6 refills | Status: DC
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
## 2019-09-13 ENCOUNTER — Telehealth: Payer: Self-pay | Admitting: Family Medicine

## 2019-09-13 ENCOUNTER — Telehealth: Payer: Self-pay

## 2019-09-13 LAB — COMPREHENSIVE METABOLIC PANEL
ALT: 57 IU/L — ABNORMAL HIGH (ref 0–44)
AST: 30 IU/L (ref 0–40)
Albumin/Globulin Ratio: 1.7 (ref 1.2–2.2)
Albumin: 4.5 g/dL (ref 4.0–5.0)
Alkaline Phosphatase: 97 IU/L (ref 39–117)
BUN/Creatinine Ratio: 9 (ref 9–20)
BUN: 8 mg/dL (ref 6–20)
Bilirubin Total: 0.4 mg/dL (ref 0.0–1.2)
CO2: 24 mmol/L (ref 20–29)
Calcium: 9.3 mg/dL (ref 8.7–10.2)
Chloride: 104 mmol/L (ref 96–106)
Creatinine, Ser: 0.85 mg/dL (ref 0.76–1.27)
GFR calc Af Amer: 132 mL/min/{1.73_m2} (ref >59)
GFR calc non Af Amer: 114 mL/min/{1.73_m2} (ref >59)
Globulin, Total: 2.6 g/dL (ref 1.5–4.5)
Glucose: 95 mg/dL (ref 65–99)
Potassium: 4.4 mmol/L (ref 3.5–5.2)
Sodium: 140 mmol/L (ref 134–144)
Total Protein: 7.1 g/dL (ref 6.0–8.5)

## 2019-09-13 LAB — LIPID PANEL
Chol/HDL Ratio: 4.6 ratio (ref 0.0–5.0)
Cholesterol, Total: 146 mg/dL (ref 100–199)
HDL: 32 mg/dL — ABNORMAL LOW (ref >39)
LDL Chol Calc (NIH): 75 mg/dL (ref 0–99)
Triglycerides: 233 mg/dL — ABNORMAL HIGH (ref 0–149)
VLDL Cholesterol Cal: 39 mg/dL (ref 5–40)

## 2019-09-13 NOTE — Telephone Encounter (Signed)
PA in process  Cover My Meds Key: [id number] - PA Case ID: [id number] - Rx #: [id number]  FLUoxetine HCl 20MG  tablets

## 2019-09-13 NOTE — Telephone Encounter (Signed)
PA was DENIED for Fluoxetine TAB 20 mg  Med is not on formulary Need to try and fail one of these covered meds:  Citalopram Pristiq Duloxetine 20, 30, or 60 Lexapro Fetzima FLUOXITINE CAPSULE --- Sertraline  Trintellix Viibryd

## 2019-09-13 NOTE — Telephone Encounter (Signed)
Could try to get fluoxetine 20 mg capsule approved, not tab

## 2019-09-14 MED ORDER — FLUOXETINE HCL 20 MG PO CAPS: 20.0000 mg | ORAL_CAPSULE | Freq: Every day | ORAL | 1 refills | Status: DC

## 2019-09-14 NOTE — Telephone Encounter (Signed)
Re- sent fluoxetine as a CAP instead of TAB - this should help with approval.  Will close PA for TAB

## 2019-09-14 NOTE — Telephone Encounter (Signed)
Duplicate

## 2019-09-15 ENCOUNTER — Telehealth: Payer: Self-pay | Admitting: Family Medicine

## 2019-09-15 NOTE — Telephone Encounter (Signed)
Called and aware of PM per rakes

## 2019-09-21 ENCOUNTER — Telehealth: Payer: Self-pay | Admitting: Family Medicine

## 2019-09-21 NOTE — Telephone Encounter (Signed)
Televisit made with [facility] on 03/18

## 2019-09-22 ENCOUNTER — Encounter: Payer: Self-pay | Admitting: Family Medicine

## 2019-09-22 ENCOUNTER — Ambulatory Visit (INDEPENDENT_AMBULATORY_CARE_PROVIDER_SITE_OTHER): Payer: Medicare Other | Admitting: Family Medicine

## 2019-09-22 DIAGNOSIS — R1012 Left upper quadrant pain: Secondary | ICD-10-CM | POA: Diagnosis not present

## 2019-09-22 DIAGNOSIS — R079 Chest pain, unspecified: Secondary | ICD-10-CM | POA: Diagnosis not present

## 2019-09-22 DIAGNOSIS — Z79899 Other long term (current) drug therapy: Secondary | ICD-10-CM | POA: Diagnosis not present

## 2019-09-22 DIAGNOSIS — Z880 Allergy status to penicillin: Secondary | ICD-10-CM | POA: Diagnosis not present

## 2019-09-22 DIAGNOSIS — E7800 Pure hypercholesterolemia, unspecified: Secondary | ICD-10-CM | POA: Diagnosis not present

## 2019-09-22 DIAGNOSIS — R110 Nausea: Secondary | ICD-10-CM | POA: Diagnosis not present

## 2019-09-22 DIAGNOSIS — Z881 Allergy status to other antibiotic agents status: Secondary | ICD-10-CM | POA: Diagnosis not present

## 2019-09-22 DIAGNOSIS — F909 Attention-deficit hyperactivity disorder, unspecified type: Secondary | ICD-10-CM | POA: Diagnosis not present

## 2019-09-22 DIAGNOSIS — K219 Gastro-esophageal reflux disease without esophagitis: Secondary | ICD-10-CM | POA: Diagnosis not present

## 2019-09-22 DIAGNOSIS — R109 Unspecified abdominal pain: Secondary | ICD-10-CM | POA: Diagnosis not present

## 2019-09-22 DIAGNOSIS — R42 Dizziness and giddiness: Secondary | ICD-10-CM | POA: Diagnosis not present

## 2019-09-22 DIAGNOSIS — R252 Cramp and spasm: Secondary | ICD-10-CM | POA: Diagnosis not present

## 2019-09-22 DIAGNOSIS — R1013 Epigastric pain: Secondary | ICD-10-CM | POA: Diagnosis not present

## 2019-09-22 DIAGNOSIS — Z888 Allergy status to other drugs, medicaments and biological substances status: Secondary | ICD-10-CM | POA: Diagnosis not present

## 2019-09-22 DIAGNOSIS — F319 Bipolar disorder, unspecified: Secondary | ICD-10-CM | POA: Diagnosis not present

## 2019-09-22 DIAGNOSIS — M791 Myalgia, unspecified site: Secondary | ICD-10-CM
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
## 2019-09-27 ENCOUNTER — Telehealth (INDEPENDENT_AMBULATORY_CARE_PROVIDER_SITE_OTHER): Payer: Self-pay | Admitting: Internal Medicine

## 2019-09-27 NOTE — Telephone Encounter (Signed)
Patient called stated he has been to the ED for chest pains - ED doctor states the pain is coming from a hiatal hernia - patient wants to know what he should do about the hernia at this point - please advise - [phone]

## 2019-09-28 ENCOUNTER — Telehealth (INDEPENDENT_AMBULATORY_CARE_PROVIDER_SITE_OTHER): Payer: Self-pay | Admitting: Internal Medicine

## 2019-09-28 NOTE — Telephone Encounter (Signed)
Please contact the patient and have him to make appointment with his PCP for further evaluation.

## 2019-09-28 NOTE — Telephone Encounter (Signed)
Spoke to patient advised him to follow up with PCP - patient stated they told him to contact his GI doctor - patient stated he would contact PCP to be referred to a surgeon

## 2019-10-04 ENCOUNTER — Telehealth: Payer: Self-pay | Admitting: Family Medicine

## 2019-10-04 NOTE — Telephone Encounter (Signed)
Pt says he recently saw Dr Thayer Ohm and was sent by her to Gastroenterology. Pt says he had a televisit with them on 09/30/19 and was told that he needed to get back in touch with his PCP and let them know that he needs to be referred to have surgery because he has a hernia in his chest. Pt only prefers to see someone close by if possible. Does not want to travel further than he has to.

## 2019-10-04 NOTE — Telephone Encounter (Signed)
Pt needs advice on whether it is ok for him to start a weight loss supplement called One Shot Keto?

## 2019-10-05 ENCOUNTER — Other Ambulatory Visit: Payer: Self-pay

## 2019-10-05 ENCOUNTER — Ambulatory Visit (INDEPENDENT_AMBULATORY_CARE_PROVIDER_SITE_OTHER): Payer: Medicare Other | Admitting: Family Medicine

## 2019-10-05 ENCOUNTER — Emergency Department (HOSPITAL_BASED_OUTPATIENT_CLINIC_OR_DEPARTMENT_OTHER): Payer: Medicare Other

## 2019-10-05 ENCOUNTER — Encounter (HOSPITAL_BASED_OUTPATIENT_CLINIC_OR_DEPARTMENT_OTHER): Payer: Self-pay

## 2019-10-05 ENCOUNTER — Encounter: Payer: Self-pay | Admitting: Family Medicine

## 2019-10-05 ENCOUNTER — Emergency Department (HOSPITAL_BASED_OUTPATIENT_CLINIC_OR_DEPARTMENT_OTHER)
Admission: EM | Admit: 2019-10-05 | Discharge: 2019-10-05 | Disposition: A | Discharge status: Home / Self Care | Payer: Medicare Other | Attending: Emergency Medicine | Admitting: Emergency Medicine

## 2019-10-05 DIAGNOSIS — R1012 Left upper quadrant pain: Secondary | ICD-10-CM | POA: Diagnosis not present

## 2019-10-05 DIAGNOSIS — Z79899 Other long term (current) drug therapy: Secondary | ICD-10-CM | POA: Insufficient documentation

## 2019-10-05 DIAGNOSIS — Z880 Allergy status to penicillin: Secondary | ICD-10-CM | POA: Diagnosis not present

## 2019-10-05 DIAGNOSIS — K625 Hemorrhage of anus and rectum: Secondary | ICD-10-CM | POA: Diagnosis not present

## 2019-10-05 DIAGNOSIS — I10 Essential (primary) hypertension: Secondary | ICD-10-CM | POA: Diagnosis not present

## 2019-10-05 DIAGNOSIS — Z881 Allergy status to other antibiotic agents status: Secondary | ICD-10-CM | POA: Insufficient documentation

## 2019-10-05 DIAGNOSIS — R1032 Left lower quadrant pain: Secondary | ICD-10-CM | POA: Insufficient documentation

## 2019-10-05 DIAGNOSIS — R109 Unspecified abdominal pain: Secondary | ICD-10-CM | POA: Diagnosis not present

## 2019-10-05 DIAGNOSIS — E785 Hyperlipidemia, unspecified: Secondary | ICD-10-CM | POA: Diagnosis not present

## 2019-10-05 DIAGNOSIS — R5383 Other fatigue: Secondary | ICD-10-CM | POA: Insufficient documentation

## 2019-10-05 DIAGNOSIS — K760 Fatty (change of) liver, not elsewhere classified: Secondary | ICD-10-CM | POA: Diagnosis not present

## 2019-10-05 LAB — URINALYSIS, ROUTINE W REFLEX MICROSCOPIC
Bilirubin Urine: NEGATIVE
Glucose, UA: NEGATIVE mg/dL
Hgb urine dipstick: NEGATIVE
Ketones, ur: NEGATIVE mg/dL
Leukocytes,Ua: NEGATIVE
Nitrite: NEGATIVE
Protein, ur: NEGATIVE mg/dL
Specific Gravity, Urine: 1.015 (ref 1.005–1.030)
pH: 6.5 (ref 5.0–8.0)

## 2019-10-05 LAB — BASIC METABOLIC PANEL
Anion gap: 11 (ref 5–15)
BUN: 15 mg/dL (ref 6–20)
CO2: 26 mmol/L (ref 22–32)
Calcium: 9.9 mg/dL (ref 8.9–10.3)
Chloride: 98 mmol/L (ref 98–111)
Creatinine, Ser: 0.88 mg/dL (ref 0.61–1.24)
GFR calc Af Amer: >60 mL/min (ref >60)
GFR calc non Af Amer: >60 mL/min (ref >60)
Glucose, Bld: 110 mg/dL — ABNORMAL HIGH (ref 70–99)
Potassium: 4.2 mmol/L (ref 3.5–5.1)
Sodium: 135 mmol/L (ref 135–145)

## 2019-10-05 LAB — CBC
HCT: 47.8 % (ref 39.0–52.0)
Hemoglobin: 16.3 g/dL (ref 13.0–17.0)
MCH: 29.6 pg (ref 26.0–34.0)
MCHC: 34.1 g/dL (ref 30.0–36.0)
MCV: 86.9 fL (ref 80.0–100.0)
Platelets: 352 10*3/uL (ref 150–400)
RBC: 5.5 MIL/uL (ref 4.22–5.81)
RDW: 11.9 % (ref 11.5–15.5)
WBC: 7.1 10*3/uL (ref 4.0–10.5)
nRBC: 0 % (ref 0.0–0.2)

## 2019-10-05 MED ORDER — IOHEXOL 300 MG/ML  SOLN
100.0000 mL | Freq: Once | INTRAMUSCULAR | Status: AC
  Administered 2019-10-05: 18:00:00 100 mL via INTRAVENOUS

## 2019-10-05 NOTE — ED Triage Notes (Signed)
C/o of increased fatigue x 1 week, abd pain that began last night left flank that radiates to central adb. Dx'd ww/ fatty liver. 1 episode of bright red bloody stool approx 9am this am.

## 2019-10-05 NOTE — Telephone Encounter (Signed)
GI is who he will see for a hernia repair. No new referral is needed.

## 2019-10-05 NOTE — ED Notes (Signed)
Pt transported to CT ?

## 2019-10-05 NOTE — Progress Notes (Signed)
Virtual Visit via Telephone Note  I connected with Preston Berry on 10/05/19 at 3:14 PM by telephone and verified that I am speaking with the correct person using two identifiers. Preston Berry is currently located at home and nobody is currently with him during this visit. The provider, Loman Brooklyn, FNP is located in their home at time of visit.  I discussed the limitations, risks, security and privacy concerns of performing an evaluation and management service by telephone and the availability of in person appointments. I also discussed with the patient that there may be a patient responsible charge related to this service. The patient expressed understanding and agreed to proceed.  Subjective: PCP: Loman Brooklyn, FNP  Chief Complaint  Patient presents with  . Blood In Stools   Patient reports he had a normal bowel movement this morning that was following by a large amount of blood. He reports the toilet was filled with blood and the toilet paper was covered in it. He went to lay down and take a nap afterwards. He is still having blood leaking from his rectum. He has a gastroenterologist but has not spoken to them. He had a colonoscopy last year during which he had some polyps removed and small internal hemorrhoids. He also reports he feels he is losing too much blood due to feeling so tired. Denies any dizziness.    ROS: Per HPI  Current Outpatient Medications:  .  atorvastatin (LIPITOR) 20 MG tablet, Take 20 mg by mouth daily., Disp: , Rfl:  .  busPIRone (BUSPAR) 10 MG tablet, Take 1 tablet (10 mg total) by mouth 3 (three) times daily., Disp: 90 tablet, Rfl: 3 .  busPIRone (BUSPAR) 15 MG tablet, Take 1 tablet (15 mg total) by mouth 3 (three) times daily., Disp: 90 tablet, Rfl: 6 .  FLUoxetine (PROZAC) 20 MG capsule, Take 1 capsule (20 mg total) by mouth daily., Disp: 90 capsule, Rfl: 1 .  ibuprofen (ADVIL) 400 MG tablet, Take 400 mg by mouth every 8 (eight) hours as needed for  pain., Disp: , Rfl:  .  lisinopril (ZESTRIL) 5 MG tablet, Take 5 mg by mouth daily., Disp: , Rfl:  .  pantoprazole (PROTONIX) 40 MG tablet, Take 1 tablet (40 mg total) by mouth daily., Disp: 90 tablet, Rfl: 3  Allergies  Allergen Reactions  . Penicillins Anaphylaxis and Swelling    Did it involve swelling of the face/tongue/throat, SOB, or low BP? Yes Did it involve sudden or severe rash/hives, skin peeling, or any reaction on the inside of your mouth or nose? No Did you need to seek medical attention at a hospital or doctor's office? unknown When did it last happen?over 15 years If all above answers are "NO", may proceed with cephalosporin use.   Marland Kitchen Keflex [Cephalexin]     Patient states he is allergic to cephlasporins. Unknown reaction   Past Medical History:  Diagnosis Date  . Abdominal pain 02/19/2015  . Abnormal EKG 01/27/2019  . Arthritis   . Bipolar disorder ([place]) 04/10/2014  . Chest pain, rule out acute myocardial infarction 12/28/2018  . Colitis   . Constipation 02/08/2019  . Depression   . Eczema 09/18/2015  . Essential hypertension 01/13/2019  . Fatty liver   . GERD (gastroesophageal reflux disease)   . Gynecomastia   . H/O lead exposure 02/19/2015  . Hepatic steatosis   . Hepatomegaly   . Hyperlipidemia   . Hypertension   . LLQ abdominal pain 02/08/2019  .  Panic attacks 04/10/2014  . Rectal bleeding 02/08/2019  . Transaminitis 02/19/2015    Observations/Objective: A&O  No respiratory distress or wheezing audible over the phone Mood, judgement, and thought processes all WNL  Assessment and Plan: 1. Rectal bleeding - Advised patient to call gastroenterologist and if he remains unable to get in touch with them to proceed to the ER.    Follow Up Instructions:  I discussed the assessment and treatment plan with the patient. The patient was provided an opportunity to ask questions and all were answered. The patient agreed with the plan and demonstrated an understanding of  the instructions.   The patient was advised to call back or seek an in-person evaluation if the symptoms worsen or if the condition fails to improve as anticipated.  The above assessment and management plan was discussed with the patient. The patient verbalized understanding of and has agreed to the management plan. Patient is aware to call the clinic if symptoms persist or worsen. Patient is aware when to return to the clinic for a follow-up visit. Patient educated on when it is appropriate to go to the emergency department.   Time call ended: 3:31 PM  I provided 19 minutes of non-face-to-face time during this encounter.  Hendricks Limes, MSN, APRN, FNP-C [facility] 10/05/19

## 2019-10-05 NOTE — Telephone Encounter (Signed)
This should be fine. He should have some carb intake daily.

## 2019-10-05 NOTE — Telephone Encounter (Signed)
Patient aware and verbalizes understanding. 

## 2019-10-06 NOTE — Patient Outreach (Signed)
Referral to [facility] @ [place] for processing on 10/06/19
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
## 2019-10-19 ENCOUNTER — Ambulatory Visit: Payer: Medicare Other | Admitting: *Deleted

## 2019-10-19 DIAGNOSIS — R079 Chest pain, unspecified: Secondary | ICD-10-CM

## 2019-10-19 DIAGNOSIS — K625 Hemorrhage of anus and rectum: Secondary | ICD-10-CM

## 2019-10-19 NOTE — Chronic Care Management (AMB) (Signed)
  Chronic Care Management   Initial Outreach Note  10/19/2019 Name: Preston Berry MRN: [id number] DOB: May 04, 1986  Preston Berry was referred by the ED for CCM and care coordination services. I reviewed his chart and talked with him about his most recent ED visit and chronic medical conditions. He is scheduled to see his PCP next week and GI next month.   Preston Berry was given information about Chronic Care Management services today including:  1. CCM service includes personalized support from designated clinical staff supervised by his physician, including individualized plan of care and coordination with other care providers 2. 24/7 contact phone numbers for assistance for urgent and routine care needs. 3. Service will only be billed when office clinical staff spend 20 minutes or more in a month to coordinate care. 4. Only one practitioner may furnish and bill the service in a calendar month. 5. The patient may stop CCM services at any time (effective at the end of the month) by phone call to the office staff. 6. The patient will be responsible for cost sharing (co-pay) of up to 20% of the service fee (after annual deductible is met).  Patient did not agree to enrollment in care management services and does not wish to consider at this time.    Follow-up Plan  No further CCM follow-up is needed  Patient provided with contact information and encouraged to reach out if he decides to consent to services in the future  Encouraged to keep future appointments with PCP and GI specialist   Chong Sicilian, BSN, RN-BC [facility] Direct Dial: [phone]

## 2019-10-19 NOTE — Patient Instructions (Signed)
  Preston Berry was referred by the ED for CCM and care coordination services. I reviewed his chart and talked with him about his most recent ED visit and chronic medical conditions. He is scheduled to see his PCP next week and GI next month.   Preston Berry was given information about Chronic Care Management services today including:  1. CCM service includes personalized support from designated clinical staff supervised by his physician, including individualized plan of care and coordination with other care providers 2. 24/7 contact phone numbers for assistance for urgent and routine care needs. 3. Service will only be billed when office clinical staff spend 20 minutes or more in a month to coordinate care. 4. Only one practitioner may furnish and bill the service in a calendar month. 5. The patient may stop CCM services at any time (effective at the end of the month) by phone call to the office staff. 6. The patient will be responsible for cost sharing (co-pay) of up to 20% of the service fee (after annual deductible is met).  Patient did not agree to enrollment in care management services and does not wish to consider at this time.    Follow-up Plan  No further CCM follow-up is needed  Patient provided with contact information and encouraged to reach out if he decides to consent to services in the future  Encouraged to keep future appointments with PCP and GI specialist   Chong Sicilian, BSN, RN-BC [facility] Direct Dial: [phone]

## 2019-10-26 ENCOUNTER — Ambulatory Visit: Payer: Medicare Other | Admitting: Family Medicine

## 2019-10-31 ENCOUNTER — Encounter: Payer: Self-pay | Admitting: Gastroenterology

## 2019-10-31 ENCOUNTER — Telehealth: Payer: Self-pay | Admitting: Family Medicine

## 2019-10-31 DIAGNOSIS — Z79899 Other long term (current) drug therapy: Secondary | ICD-10-CM | POA: Diagnosis not present

## 2019-10-31 DIAGNOSIS — F909 Attention-deficit hyperactivity disorder, unspecified type: Secondary | ICD-10-CM | POA: Diagnosis not present

## 2019-10-31 DIAGNOSIS — K219 Gastro-esophageal reflux disease without esophagitis: Secondary | ICD-10-CM | POA: Diagnosis not present

## 2019-10-31 DIAGNOSIS — F319 Bipolar disorder, unspecified: Secondary | ICD-10-CM | POA: Diagnosis not present

## 2019-10-31 DIAGNOSIS — Z888 Allergy status to other drugs, medicaments and biological substances status: Secondary | ICD-10-CM | POA: Diagnosis not present

## 2019-10-31 DIAGNOSIS — Z881 Allergy status to other antibiotic agents status: Secondary | ICD-10-CM | POA: Diagnosis not present

## 2019-10-31 DIAGNOSIS — E7800 Pure hypercholesterolemia, unspecified: Secondary | ICD-10-CM | POA: Diagnosis not present

## 2019-10-31 DIAGNOSIS — R079 Chest pain, unspecified: Secondary | ICD-10-CM | POA: Diagnosis not present

## 2019-10-31 DIAGNOSIS — R0789 Other chest pain: Secondary | ICD-10-CM | POA: Diagnosis not present

## 2019-10-31 DIAGNOSIS — Z880 Allergy status to penicillin: Secondary | ICD-10-CM | POA: Diagnosis not present

## 2019-10-31 NOTE — Telephone Encounter (Signed)
Patient went to [facility] with chest pain, was diagnosed as chest wall pain.  Has had similar episodes in past and was told it was anxiety.  An appointment was scheduled for follow up on 11/02/2019 at 11:20 am with Hendricks Limes.  Records were requested from [facility].

## 2019-11-02 ENCOUNTER — Ambulatory Visit: Payer: Medicare Other | Admitting: Family Medicine

## 2019-11-03 ENCOUNTER — Ambulatory Visit: Payer: Medicare Other | Admitting: Family Medicine
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
## 2019-11-17 ENCOUNTER — Ambulatory Visit (INDEPENDENT_AMBULATORY_CARE_PROVIDER_SITE_OTHER): Payer: Medicare Other | Admitting: Family Medicine

## 2019-11-17 ENCOUNTER — Other Ambulatory Visit: Payer: Self-pay

## 2019-11-17 VITALS — BP 131/83 | HR 71 | Temp 98.2°F | Ht 68.0 in | Wt 239.4 lb

## 2019-11-17 DIAGNOSIS — M940 Chondrocostal junction syndrome [Tietze]: Secondary | ICD-10-CM | POA: Diagnosis not present

## 2019-11-17 DIAGNOSIS — I10 Essential (primary) hypertension: Secondary | ICD-10-CM

## 2019-11-17 DIAGNOSIS — F3175 Bipolar disorder, in partial remission, most recent episode depressed: Secondary | ICD-10-CM

## 2019-11-17 DIAGNOSIS — F331 Major depressive disorder, recurrent, moderate: Secondary | ICD-10-CM | POA: Diagnosis not present

## 2019-11-17 DIAGNOSIS — F339 Major depressive disorder, recurrent, unspecified: Secondary | ICD-10-CM

## 2019-11-17 DIAGNOSIS — F411 Generalized anxiety disorder: Secondary | ICD-10-CM

## 2019-11-17 MED ORDER — PREDNISONE 10 MG (21) PO TBPK: ORAL_TABLET | ORAL | 0 refills | Status: DC

## 2019-11-18 ENCOUNTER — Ambulatory Visit: Payer: Medicare Other | Admitting: Family Medicine

## 2019-11-20 ENCOUNTER — Encounter: Payer: Self-pay | Admitting: Family Medicine

## 2019-11-22 ENCOUNTER — Encounter: Payer: Self-pay | Admitting: Family Medicine

## 2019-11-25 ENCOUNTER — Ambulatory Visit (INDEPENDENT_AMBULATORY_CARE_PROVIDER_SITE_OTHER): Payer: Medicare Other | Admitting: Family Medicine

## 2019-11-25 DIAGNOSIS — M545 Low back pain, unspecified: Secondary | ICD-10-CM

## 2019-11-25 MED ORDER — PREDNISONE 10 MG PO TABS: ORAL_TABLET | ORAL | 0 refills | Status: DC

## 2019-11-25 MED ORDER — CYCLOBENZAPRINE HCL 10 MG PO TABS: 10.0000 mg | ORAL_TABLET | Freq: Three times a day (TID) | ORAL | 1 refills | Status: DC | PRN

## 2019-11-25 NOTE — Patient Instructions (Signed)

## 2019-11-25 NOTE — Progress Notes (Signed)
Subjective:    Patient ID: Preston Berry, male    DOB: 1985-11-28, 34 y.o.   MRN: [id number]   HPI: Preston Berry is a 34 y.o. male presenting for [place] M[place]ar was normal until he sat down watching TV in the evening. Got up & felt his back go out. It is going from hips to feet bilaterally. NKI. 10/10 severity with standing to walk.    Depression screen [facility] 2/9 11/17/2019 09/12/2019 08/04/2019 07/20/2019 05/31/2019  Decreased Interest 3 2 0 1 2  Down, Depressed, Hopeless 0 0 - 0 -  PHQ - 2 Score 3 2 0 1 2  Altered sleeping 3 3 2 2 2   Tired, decreased energy 3 - 3 0 2  Change in appetite 3 0 0 0 0  Feeling bad or failure about yourself  2 2 0 0 0  Trouble concentrating 3 3 0 1 1  Moving slowly or fidgety/restless 2 2 0 0 0  Suicidal thoughts 0 0 0 - 0  PHQ-9 Score 19 12 5 4 7   Difficult doing work/chores - Somewhat difficult Somewhat difficult - -  Some recent data might be hidden     Relevant past medical, surgical, family and social history reviewed and updated as indicated.  Interim medical history since our last visit reviewed. Allergies and medications reviewed and updated.  ROS:  Review of Systems  Constitutional: Negative for chills, diaphoresis and fever.  HENT: Negative for sore throat.   Respiratory: Negative for shortness of breath.   Cardiovascular: Negative for chest pain.  Gastrointestinal: Negative for abdominal pain.  Musculoskeletal: Positive for arthralgias, back pain, gait problem and myalgias. Negative for neck pain.  Skin: Negative for rash.  Neurological: Positive for weakness. Negative for numbness.     Social History   Tobacco Use  Smoking Status Never Smoker  Smokeless Tobacco Never Used       Objective:     Wt Readings from Last 3 Encounters:  11/17/19 239 lb 6.4 oz (108.6 kg)  10/05/19 236 lb (107 kg)  09/12/19 234 lb (106.1 kg)     Exam deferred. Pt. Harboring due to COVID 19. Phone visit performed.   Assessment & Plan:   1. Acute  bilateral low back pain without sciatica     Meds ordered this encounter  Medications  . predniSONE (DELTASONE) 10 MG tablet    Sig: Take 5 daily for 3 days followed by 4,3,2 and 1 for 3 days each.    Dispense:  45 tablet    Refill:  0  . cyclobenzaprine (FLEXERIL) 10 MG tablet    Sig: Take 1 tablet (10 mg total) by mouth 3 (three) times daily as needed for muscle spasms.    Dispense:  90 tablet    Refill:  1    No orders of the defined types were placed in this encounter.     Diagnoses and all orders for this visit:  Acute bilateral low back pain without sciatica  Other orders -     predniSONE (DELTASONE) 10 MG tablet; Take 5 daily for 3 days followed by 4,3,2 and 1 for 3 days each. -     cyclobenzaprine (FLEXERIL) 10 MG tablet; Take 1 tablet (10 mg total) by mouth 3 (three) times daily as needed for muscle spasms.    Virtual Visit via telephone Note  I discussed the limitations, risks, security and privacy concerns of performing an evaluation and management service by telephone and the availability of in  person appointments. The patient was identified with two identifiers. Pt.expressed understanding and agreed to proceed. Pt. Is at home. Dr. Livia Snellen is in his office.  Follow Up Instructions:   I discussed the assessment and treatment plan with the patient. The patient was provided an opportunity to ask questions and all were answered. The patient agreed with the plan and demonstrated an understanding of the instructions.   The patient was advised to call back or seek an in-person evaluation if the symptoms worsen or if the condition fails to improve as anticipated.   Total minutes including chart review and phone contact time: 14   Follow up plan: Return in 6 weeks (on 01/06/2020), or if symptoms worsen or fail to improve.  Claretta Fraise, MD [place]

## 2019-11-27 ENCOUNTER — Encounter: Payer: Self-pay | Admitting: Family Medicine

## 2019-11-28 ENCOUNTER — Telehealth: Payer: Self-pay | Admitting: Family Medicine

## 2019-11-28 DIAGNOSIS — F411 Generalized anxiety disorder: Secondary | ICD-10-CM

## 2019-11-28 DIAGNOSIS — F339 Major depressive disorder, recurrent, unspecified: Secondary | ICD-10-CM

## 2019-11-28 NOTE — Telephone Encounter (Signed)
I have notified [facility].

## 2019-11-28 NOTE — Telephone Encounter (Signed)
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

## 2019-11-28 NOTE — Telephone Encounter (Signed)
Please reach out to patient regarding recent [facility] test. He signed his name with the suffix of Brooke Bonito on the consent form. His insurance does not have a suffix listed. He is or is not a Brooke Bonito?

## 2019-11-29 MED ORDER — ESCITALOPRAM OXALATE 10 MG PO TABS: 10.0000 mg | ORAL_TABLET | Freq: Every day | ORAL | 2 refills | Status: DC

## 2019-11-29 NOTE — Telephone Encounter (Signed)
Pt called = states that he has not tried the Lexapro and is willing to give it a try.  Please call it in to [place]

## 2019-11-29 NOTE — Addendum Note (Signed)
Addended by: Loman Brooklyn on: 11/29/2019 02:34 PM   Modules accepted: Orders

## 2019-11-29 NOTE — Telephone Encounter (Signed)
I now have patient's [facility] test results. Would he be agreeable to adding Lexapro? This was not a medication he told me he had in the past and it looks like it would be a good one for him.

## 2019-12-02 ENCOUNTER — Emergency Department (HOSPITAL_COMMUNITY): Payer: Medicare Other

## 2019-12-02 ENCOUNTER — Encounter: Payer: Self-pay | Admitting: Family Medicine

## 2019-12-02 ENCOUNTER — Encounter (HOSPITAL_COMMUNITY): Payer: Self-pay | Admitting: *Deleted

## 2019-12-02 ENCOUNTER — Emergency Department (HOSPITAL_COMMUNITY)
Admission: EM | Admit: 2019-12-02 | Discharge: 2019-12-02 | Disposition: A | Discharge status: Home / Self Care | Payer: Medicare Other | Attending: Emergency Medicine | Admitting: Emergency Medicine

## 2019-12-02 ENCOUNTER — Other Ambulatory Visit: Payer: Self-pay

## 2019-12-02 ENCOUNTER — Ambulatory Visit (INDEPENDENT_AMBULATORY_CARE_PROVIDER_SITE_OTHER): Payer: Medicare Other | Admitting: Family Medicine

## 2019-12-02 DIAGNOSIS — R079 Chest pain, unspecified: Secondary | ICD-10-CM

## 2019-12-02 DIAGNOSIS — I10 Essential (primary) hypertension: Secondary | ICD-10-CM | POA: Insufficient documentation

## 2019-12-02 DIAGNOSIS — Z79899 Other long term (current) drug therapy: Secondary | ICD-10-CM | POA: Insufficient documentation

## 2019-12-02 DIAGNOSIS — R0789 Other chest pain: Secondary | ICD-10-CM | POA: Insufficient documentation

## 2019-12-02 LAB — CBC
HCT: 46 % (ref 39.0–52.0)
Hemoglobin: 15.2 g/dL (ref 13.0–17.0)
MCH: 29.9 pg (ref 26.0–34.0)
MCHC: 33 g/dL (ref 30.0–36.0)
MCV: 90.4 fL (ref 80.0–100.0)
Platelets: 342 10*3/uL (ref 150–400)
RBC: 5.09 MIL/uL (ref 4.22–5.81)
RDW: 12.8 % (ref 11.5–15.5)
WBC: 11.6 10*3/uL — ABNORMAL HIGH (ref 4.0–10.5)
nRBC: 0 % (ref 0.0–0.2)

## 2019-12-02 LAB — BASIC METABOLIC PANEL
Anion gap: 12 (ref 5–15)
BUN: 17 mg/dL (ref 6–20)
CO2: 27 mmol/L (ref 22–32)
Calcium: 8.7 mg/dL — ABNORMAL LOW (ref 8.9–10.3)
Chloride: 100 mmol/L (ref 98–111)
Creatinine, Ser: 0.87 mg/dL (ref 0.61–1.24)
GFR calc Af Amer: >60 mL/min (ref >60)
GFR calc non Af Amer: >60 mL/min (ref >60)
Glucose, Bld: 116 mg/dL — ABNORMAL HIGH (ref 70–99)
Potassium: 3.7 mmol/L (ref 3.5–5.1)
Sodium: 139 mmol/L (ref 135–145)

## 2019-12-02 LAB — TROPONIN I (HIGH SENSITIVITY)
Troponin I (High Sensitivity): 5 ng/L (ref <18)
Troponin I (High Sensitivity): 5 ng/L (ref <18)

## 2019-12-02 NOTE — Progress Notes (Signed)
Virtual Visit via Telephone Note  I connected with Preston Berry on 12/02/19 at 4:18 PM by telephone and verified that I am speaking with the correct person using two identifiers. Preston Berry is currently located at home and his fiance is currently with him during this visit. The provider, Loman Brooklyn, FNP is located in their office at time of visit.  I discussed the limitations, risks, security and privacy concerns of performing an evaluation and management service by telephone and the availability of in person appointments. I also discussed with the patient that there may be a patient responsible charge related to this service. The patient expressed understanding and agreed to proceed.  Subjective: PCP: Loman Brooklyn, FNP  Chief Complaint  Patient presents with  . Hypertension   Patient reports BP has been running high for two days with a systolic ranging from [id number]. He also reports symptoms of feeling like a ton of bricks is on his chest making it hard to breath, sick to his stomach, sweating, chest pain, left arm pain, and neck soreness. Denies feeling anxious at all over the past two days.   ROS: Per HPI  Current Outpatient Medications:  .  atorvastatin (LIPITOR) 20 MG tablet, Take 20 mg by mouth daily., Disp: , Rfl:  .  busPIRone (BUSPAR) 15 MG tablet, Take 1 tablet (15 mg total) by mouth 3 (three) times daily., Disp: 90 tablet, Rfl: 6 .  cyclobenzaprine (FLEXERIL) 10 MG tablet, Take 1 tablet (10 mg total) by mouth 3 (three) times daily as needed for muscle spasms., Disp: 90 tablet, Rfl: 1 .  escitalopram (LEXAPRO) 10 MG tablet, Take 1 tablet (10 mg total) by mouth daily., Disp: 30 tablet, Rfl: 2 .  ibuprofen (ADVIL) 400 MG tablet, Take 400 mg by mouth every 8 (eight) hours as needed for pain., Disp: , Rfl:  .  lisinopril (ZESTRIL) 5 MG tablet, Take 5 mg by mouth daily., Disp: , Rfl:  .  pantoprazole (PROTONIX) 40 MG tablet, Take 1 tablet (40 mg total) by mouth daily.,  Disp: 90 tablet, Rfl: 3 .  predniSONE (DELTASONE) 10 MG tablet, Take 5 daily for 3 days followed by 4,3,2 and 1 for 3 days each., Disp: 45 tablet, Rfl: 0  Allergies  Allergen Reactions  . Penicillins Anaphylaxis and Swelling    Did it involve swelling of the face/tongue/throat, SOB, or low BP? Yes Did it involve sudden or severe rash/hives, skin peeling, or any reaction on the inside of your mouth or nose? No Did you need to seek medical attention at a hospital or doctor's office? unknown When did it last happen?over 15 years If all above answers are "NO", may proceed with cephalosporin use.   Marland Kitchen Keflex [Cephalexin]     Patient states he is allergic to cephlasporins. Unknown reaction   Past Medical History:  Diagnosis Date  . Abdominal pain 02/19/2015  . Abnormal EKG 01/27/2019  . Arthritis   . Bipolar disorder ([place]) 04/10/2014  . Chest pain, rule out acute myocardial infarction 12/28/2018  . Colitis   . Constipation 02/08/2019  . Depression   . Eczema 09/18/2015  . Essential hypertension 01/13/2019  . Fatty liver   . GERD (gastroesophageal reflux disease)   . Gynecomastia   . H/O lead exposure 02/19/2015  . Hepatic steatosis   . Hepatomegaly   . Hyperlipidemia   . Hypertension   . LLQ abdominal pain 02/08/2019  . Panic attacks 04/10/2014  . Rectal bleeding 02/08/2019  .  Transaminitis 02/19/2015    Observations/Objective: A&O  No respiratory distress or wheezing audible over the phone Mood, judgement, and thought processes all WNL   Assessment and Plan: 1. Chest pain, unspecified type - Advised to go to ER given current symptoms.    Follow Up Instructions:  I discussed the assessment and treatment plan with the patient. The patient was provided an opportunity to ask questions and all were answered. The patient agreed with the plan and demonstrated an understanding of the instructions.   The patient was advised to call back or seek an in-person evaluation if the symptoms worsen  or if the condition fails to improve as anticipated.  The above assessment and management plan was discussed with the patient. The patient verbalized understanding of and has agreed to the management plan. Patient is aware to call the clinic if symptoms persist or worsen. Patient is aware when to return to the clinic for a follow-up visit. Patient educated on when it is appropriate to go to the emergency department.   Time call ended: 4:27 PM  I provided 11 minutes of non-face-to-face time during this encounter.  Hendricks Limes, MSN, APRN, FNP-C [facility] 12/02/19

## 2019-12-02 NOTE — ED Triage Notes (Signed)
C/o hypertension and chest  Pain x 3-4 days
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
## 2019-12-02 NOTE — Discharge Instructions (Addendum)
Continue your current medications. Follow-up with your primary care doctor. Return as needed for worsening symptoms °

## 2019-12-14 ENCOUNTER — Telehealth (INDEPENDENT_AMBULATORY_CARE_PROVIDER_SITE_OTHER): Payer: Medicare Other | Admitting: Family Medicine

## 2019-12-14 DIAGNOSIS — B86 Scabies: Secondary | ICD-10-CM | POA: Diagnosis not present

## 2019-12-14 MED ORDER — LISINOPRIL 10 MG PO TABS: 10.0000 mg | ORAL_TABLET | Freq: Every day | ORAL | 1 refills | Status: DC

## 2019-12-14 MED ORDER — PERMETHRIN 5 % EX CREA: 1.0000 "application " | TOPICAL_CREAM | Freq: Once | CUTANEOUS | 0 refills | Status: AC

## 2019-12-15 ENCOUNTER — Encounter: Payer: Self-pay | Admitting: Family Medicine

## 2019-12-15 NOTE — Progress Notes (Signed)
Subjective:    Patient ID: Preston Berry, male    DOB: 1985/10/14, 34 y.o.   MRN: [id number]   HPI: Preston Berry is a 34 y.o. male presenting for pruritic red papules on the palms of the hands and in the web spaces.  Also on the chest and abdomen.  Present for a few days.   Depression screen [facility] 2/9 11/17/2019 09/12/2019 08/04/2019 07/20/2019 05/31/2019  Decreased Interest 3 2 0 1 2  Down, Depressed, Hopeless 0 0 - 0 -  PHQ - 2 Score 3 2 0 1 2  Altered sleeping 3 3 2 2 2   Tired, decreased energy 3 - 3 0 2  Change in appetite 3 0 0 0 0  Feeling bad or failure about yourself  2 2 0 0 0  Trouble concentrating 3 3 0 1 1  Moving slowly or fidgety/restless 2 2 0 0 0  Suicidal thoughts 0 0 0 - 0  PHQ-9 Score 19 12 5 4 7   Difficult doing work/chores - Somewhat difficult Somewhat difficult - -  Some recent data might be hidden     Relevant past medical, surgical, family and social history reviewed and updated as indicated.  Interim medical history since our last visit reviewed. Allergies and medications reviewed and updated.  ROS:  Review of Systems  Respiratory: Negative for shortness of breath.   Cardiovascular: Negative for chest pain.  Musculoskeletal: Negative for arthralgias.  Skin: Positive for rash.     Social History   Tobacco Use  Smoking Status Never Smoker  Smokeless Tobacco Never Used       Objective:     Wt Readings from Last 3 Encounters:  12/02/19 238 lb (108 kg)  11/17/19 239 lb 6.4 oz (108.6 kg)  10/05/19 236 lb (107 kg)     Exam deferred. Pt. Harboring due to COVID 19.  Video visit performed.  Through the video I was able to see the papules on the webspaces of the hands.  These are mildly erythematous.  There are also small red patches on the palms measuring 2 to 6 mm. Assessment & Plan:   1. Scabies     Meds ordered this encounter  Medications  . lisinopril (ZESTRIL) 10 MG tablet    Sig: Take 1 tablet (10 mg total) by mouth daily.     Dispense:  90 tablet    Refill:  1  . permethrin (ELIMITE) 5 % cream    Sig: Apply 1 application topically once for 1 dose.    Dispense:  4 g    Refill:  0        Diagnoses and all orders for this visit:  Scabies  Other orders -     lisinopril (ZESTRIL) 10 MG tablet; Take 1 tablet (10 mg total) by mouth daily. -     permethrin (ELIMITE) 5 % cream; Apply 1 application topically once for 1 dose.    Virtual Visit via telephone Note  I discussed the limitations, risks, security and privacy concerns of performing an evaluation and management service by telephone and the availability of in person appointments. The patient was identified with two identifiers. Pt.expressed understanding and agreed to proceed. Pt. Is at home. Dr. Livia Snellen is in his office.  Follow Up Instructions:   I discussed the assessment and treatment plan with the patient. The patient was provided an opportunity to ask questions and all were answered. The patient agreed with the plan and demonstrated an understanding of  the instructions.   The patient was advised to call back or seek an in-person evaluation if the symptoms worsen or if the condition fails to improve as anticipated.   Total minutes including chart review and phone contact time: 16   Follow up plan: Return if symptoms worsen or fail to improve.  Claretta Fraise, MD [place]

## 2020-01-16 ENCOUNTER — Ambulatory Visit (INDEPENDENT_AMBULATORY_CARE_PROVIDER_SITE_OTHER): Payer: Medicare Other | Admitting: Gastroenterology

## 2020-01-16 ENCOUNTER — Other Ambulatory Visit: Payer: Self-pay

## 2020-01-16 ENCOUNTER — Encounter (INDEPENDENT_AMBULATORY_CARE_PROVIDER_SITE_OTHER): Payer: Self-pay | Admitting: Gastroenterology

## 2020-01-16 VITALS — BP 134/85 | HR 76 | Ht 68.0 in | Wt 238.4 lb

## 2020-01-16 DIAGNOSIS — R0789 Other chest pain: Secondary | ICD-10-CM

## 2020-01-16 DIAGNOSIS — K219 Gastro-esophageal reflux disease without esophagitis: Secondary | ICD-10-CM | POA: Diagnosis not present

## 2020-01-16 MED ORDER — FAMOTIDINE 40 MG PO TABS: 40.0000 mg | ORAL_TABLET | Freq: Every day | ORAL | 6 refills | Status: DC
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
## 2020-01-17 ENCOUNTER — Encounter: Payer: Self-pay | Admitting: Family Medicine

## 2020-01-17 ENCOUNTER — Ambulatory Visit (INDEPENDENT_AMBULATORY_CARE_PROVIDER_SITE_OTHER): Payer: Medicare Other | Admitting: Family Medicine

## 2020-01-17 DIAGNOSIS — F411 Generalized anxiety disorder: Secondary | ICD-10-CM | POA: Diagnosis not present

## 2020-01-17 DIAGNOSIS — F339 Major depressive disorder, recurrent, unspecified: Secondary | ICD-10-CM | POA: Diagnosis not present

## 2020-01-17 MED ORDER — ESCITALOPRAM OXALATE 20 MG PO TABS: 20.0000 mg | ORAL_TABLET | Freq: Every day | ORAL | 2 refills | Status: DC
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
## 2020-01-21 DIAGNOSIS — R0789 Other chest pain: Secondary | ICD-10-CM | POA: Diagnosis not present

## 2020-01-21 DIAGNOSIS — R079 Chest pain, unspecified: Secondary | ICD-10-CM | POA: Diagnosis not present

## 2020-01-21 DIAGNOSIS — E7800 Pure hypercholesterolemia, unspecified: Secondary | ICD-10-CM | POA: Diagnosis not present

## 2020-01-21 DIAGNOSIS — R9431 Abnormal electrocardiogram [ECG] [EKG]: Secondary | ICD-10-CM | POA: Diagnosis not present

## 2020-01-21 DIAGNOSIS — I10 Essential (primary) hypertension: Secondary | ICD-10-CM | POA: Diagnosis not present

## 2020-01-21 DIAGNOSIS — F419 Anxiety disorder, unspecified: Secondary | ICD-10-CM | POA: Diagnosis not present

## 2020-02-08 ENCOUNTER — Telehealth: Payer: Self-pay | Admitting: Family Medicine

## 2020-02-08 DIAGNOSIS — K219 Gastro-esophageal reflux disease without esophagitis: Secondary | ICD-10-CM

## 2020-02-08 MED ORDER — PANTOPRAZOLE SODIUM 40 MG PO TBEC: 40.0000 mg | DELAYED_RELEASE_TABLET | Freq: Every day | ORAL | 0 refills | Status: DC

## 2020-02-08 NOTE — Telephone Encounter (Signed)
30 day supply sent in.

## 2020-02-14 ENCOUNTER — Encounter: Payer: Self-pay | Admitting: Family Medicine

## 2020-02-14 ENCOUNTER — Ambulatory Visit (INDEPENDENT_AMBULATORY_CARE_PROVIDER_SITE_OTHER): Payer: Medicare Other | Admitting: Family Medicine

## 2020-02-14 DIAGNOSIS — R350 Frequency of micturition: Secondary | ICD-10-CM | POA: Diagnosis not present

## 2020-02-14 DIAGNOSIS — R7301 Impaired fasting glucose: Secondary | ICD-10-CM

## 2020-02-14 NOTE — Progress Notes (Signed)
Virtual Visit via Telephone Note  I connected with Preston Berry on 02/14/20 at 9:46 AM by telephone and verified that I am speaking with the correct person using two identifiers. Preston Berry is currently located at home and his fiance is currently with him during this visit. The provider, Loman Brooklyn, FNP is located in their home at time of visit.  I discussed the limitations, risks, security and privacy concerns of performing an evaluation and management service by telephone and the availability of in person appointments. I also discussed with the patient that there may be a patient responsible charge related to this service. The patient expressed understanding and agreed to proceed.  Subjective: PCP: Loman Brooklyn, FNP  Chief Complaint  Patient presents with  . GI Problem   Patient reports he is urinating a lot more than usual.  States he is going 40-50 times a day in normal amounts.  States the color of his urine has changed to a white foggy color.  Is drinking 1 gallon of water per day in addition to Gatorade to stay hydrated.   ROS: Per HPI  Current Outpatient Medications:  .  busPIRone (BUSPAR) 15 MG tablet, Take 1 tablet (15 mg total) by mouth 3 (three) times daily., Disp: 90 tablet, Rfl: 6 .  escitalopram (LEXAPRO) 20 MG tablet, Take 1 tablet (20 mg total) by mouth daily., Disp: 30 tablet, Rfl: 2 .  famotidine (PEPCID) 40 MG tablet, Take 1 tablet (40 mg total) by mouth at bedtime., Disp: 30 tablet, Rfl: 6 .  ibuprofen (ADVIL) 400 MG tablet, Take 400 mg by mouth every 8 (eight) hours as needed for pain., Disp: , Rfl:  .  lisinopril (ZESTRIL) 10 MG tablet, Take 1 tablet (10 mg total) by mouth daily., Disp: 90 tablet, Rfl: 1 .  pantoprazole (PROTONIX) 40 MG tablet, Take 1 tablet (40 mg total) by mouth daily., Disp: 30 tablet, Rfl: 0  Allergies  Allergen Reactions  . Penicillins Anaphylaxis and Swelling  . Keflex [Cephalexin]     Patient states he is allergic to  cephlasporins. Unknown reaction   Past Medical History:  Diagnosis Date  . Abdominal pain 02/19/2015  . Abnormal EKG 01/27/2019  . Arthritis   . Bipolar disorder ([place]) 04/10/2014  . Chest pain, rule out acute myocardial infarction 12/28/2018  . Colitis   . Constipation 02/08/2019  . Depression   . Eczema 09/18/2015  . Essential hypertension 01/13/2019  . Fatty liver   . GERD (gastroesophageal reflux disease)   . Gynecomastia   . H/O lead exposure 02/19/2015  . Hepatic steatosis   . Hepatomegaly   . Hyperlipidemia   . Hypertension   . LLQ abdominal pain 02/08/2019  . Panic attacks 04/10/2014  . Rectal bleeding 02/08/2019  . Transaminitis 02/19/2015    Observations/Objective: A&O  No respiratory distress or wheezing audible over the phone Mood, judgement, and thought processes all WNL   Assessment and Plan: 1. Urinary frequency - Patient is not going to be able to come in until tomorrow to leave a urine specimen and lab work. - Urinalysis, Complete; Future - Urine Culture; Future - CMP14+EGFR; Future - Bayer DCA Hb A1c Waived; Future  2. Impaired fasting glucose - Bayer DCA Hb A1c Waived; Future   Follow Up Instructions:  I discussed the assessment and treatment plan with the patient. The patient was provided an opportunity to ask questions and all were answered. The patient agreed with the plan and demonstrated an understanding  of the instructions.   The patient was advised to call back or seek an in-person evaluation if the symptoms worsen or if the condition fails to improve as anticipated.  The above assessment and management plan was discussed with the patient. The patient verbalized understanding of and has agreed to the management plan. Patient is aware to call the clinic if symptoms persist or worsen. Patient is aware when to return to the clinic for a follow-up visit. Patient educated on when it is appropriate to go to the emergency department.   Time call ended: 9:52  AM  I provided 8 minutes of non-face-to-face time during this encounter.  Hendricks Limes, MSN, APRN, FNP-C [facility] 02/14/20

## 2020-02-15 ENCOUNTER — Other Ambulatory Visit: Payer: Medicare Other

## 2020-02-15 ENCOUNTER — Other Ambulatory Visit: Payer: Self-pay

## 2020-02-15 DIAGNOSIS — R7301 Impaired fasting glucose: Secondary | ICD-10-CM

## 2020-02-15 DIAGNOSIS — R350 Frequency of micturition: Secondary | ICD-10-CM | POA: Diagnosis not present

## 2020-02-15 LAB — URINALYSIS, COMPLETE
Bilirubin, UA: NEGATIVE
Leukocytes,UA: NEGATIVE
Nitrite, UA: NEGATIVE
Protein,UA: NEGATIVE
RBC, UA: NEGATIVE
Specific Gravity, UA: <1.005 — ABNORMAL LOW (ref 1.005–1.030)
Urobilinogen, Ur: 0.2 mg/dL (ref 0.2–1.0)
pH, UA: 5 (ref 5.0–7.5)

## 2020-02-15 LAB — MICROSCOPIC EXAMINATION
Bacteria, UA: NONE SEEN
Epithelial Cells (non renal): NONE SEEN /hpf (ref 0–10)
RBC, Urine: NONE SEEN /hpf (ref 0–2)
WBC, UA: NONE SEEN /hpf (ref 0–5)

## 2020-02-15 LAB — BAYER DCA HB A1C WAIVED: HB A1C (BAYER DCA - WAIVED): 10.7 % — ABNORMAL HIGH (ref <7.0)

## 2020-02-16 ENCOUNTER — Telehealth: Payer: Self-pay | Admitting: Family Medicine

## 2020-02-16 ENCOUNTER — Other Ambulatory Visit: Payer: Self-pay | Admitting: Family Medicine

## 2020-02-16 DIAGNOSIS — E1165 Type 2 diabetes mellitus with hyperglycemia: Secondary | ICD-10-CM

## 2020-02-16 LAB — CMP14+EGFR
ALT: 95 IU/L — ABNORMAL HIGH (ref 0–44)
AST: 30 IU/L (ref 0–40)
Albumin/Globulin Ratio: 1.7 (ref 1.2–2.2)
Albumin: 4.2 g/dL (ref 4.0–5.0)
Alkaline Phosphatase: 105 IU/L (ref 48–121)
BUN/Creatinine Ratio: 20 (ref 9–20)
BUN: 16 mg/dL (ref 6–20)
Bilirubin Total: 0.4 mg/dL (ref 0.0–1.2)
CO2: 21 mmol/L (ref 20–29)
Calcium: 10 mg/dL (ref 8.7–10.2)
Chloride: 88 mmol/L — ABNORMAL LOW (ref 96–106)
Creatinine, Ser: 0.81 mg/dL (ref 0.76–1.27)
GFR calc Af Amer: 135 mL/min/{1.73_m2} (ref >59)
GFR calc non Af Amer: 117 mL/min/{1.73_m2} (ref >59)
Globulin, Total: 2.5 g/dL (ref 1.5–4.5)
Glucose: 598 mg/dL (ref 65–99)
Potassium: 4.8 mmol/L (ref 3.5–5.2)
Sodium: 126 mmol/L — ABNORMAL LOW (ref 134–144)
Total Protein: 6.7 g/dL (ref 6.0–8.5)

## 2020-02-16 LAB — URINE CULTURE: Organism ID, Bacteria: NO GROWTH

## 2020-02-16 MED ORDER — BLOOD GLUCOSE MONITOR KIT: PACK | 0 refills | Status: DC

## 2020-02-16 MED ORDER — METFORMIN HCL ER 500 MG PO TB24: ORAL_TABLET | ORAL | 0 refills | Status: DC

## 2020-02-16 MED ORDER — ONETOUCH VERIO FLEX SYSTEM W/DEVICE KIT: PACK | 1 refills | Status: DC

## 2020-02-16 MED ORDER — ONETOUCH VERIO VI STRP: ORAL_STRIP | 12 refills | Status: DC

## 2020-02-16 MED ORDER — ONETOUCH DELICA LANCETS 30G MISC: 11 refills | Status: DC

## 2020-02-16 NOTE — Telephone Encounter (Signed)
Return call to patient  One touch verio flex called in to [facility] in [place]

## 2020-02-16 NOTE — Telephone Encounter (Signed)
RX corrected to testing once daily per insurance

## 2020-02-16 NOTE — Telephone Encounter (Signed)
Patient reports blood glucose meter prescription has not been received by pharmacy.  Patient is asking that it be resent to [facility].  Prescription for machine reordered and sent to [facility].

## 2020-02-16 NOTE — Telephone Encounter (Signed)
Glucometer sent in to [facility] Encouraged patient to call and bring in to his appt tom

## 2020-02-16 NOTE — Telephone Encounter (Signed)
Pt says the pharmacy has not received the order for him to get a diabetic machine and he is wanting to know how much it will cost him with his insurance.

## 2020-02-16 NOTE — Telephone Encounter (Signed)
Questions answered  appt with pharmd tom

## 2020-02-16 NOTE — Telephone Encounter (Signed)
Pharmacy needs new rx for OneTouch Delica Lancets 43Q MISC blood glucose meter kit and supplies KIT and Blood Glucose Monitoring Suppl ([place]) w/Device KIT----one time a day per insurance. It will not cover the 3TID. Please call back

## 2020-02-16 NOTE — Addendum Note (Signed)
Addended by: Lottie Dawson D on: 02/16/2020 03:07 PM   Modules accepted: Orders

## 2020-02-17 ENCOUNTER — Telehealth: Payer: Self-pay | Admitting: Family Medicine

## 2020-02-17 ENCOUNTER — Ambulatory Visit (INDEPENDENT_AMBULATORY_CARE_PROVIDER_SITE_OTHER): Payer: Medicare Other | Admitting: Pharmacist

## 2020-02-17 ENCOUNTER — Other Ambulatory Visit: Payer: Self-pay

## 2020-02-17 ENCOUNTER — Encounter: Payer: Self-pay | Admitting: Pharmacist

## 2020-02-17 DIAGNOSIS — F339 Major depressive disorder, recurrent, unspecified: Secondary | ICD-10-CM

## 2020-02-17 DIAGNOSIS — G720 Drug-induced myopathy: Secondary | ICD-10-CM | POA: Insufficient documentation

## 2020-02-17 DIAGNOSIS — F411 Generalized anxiety disorder: Secondary | ICD-10-CM | POA: Diagnosis not present

## 2020-02-17 MED ORDER — GLYXAMBI 25-5 MG PO TABS: 1.0000 | ORAL_TABLET | Freq: Every day | ORAL | 3 refills | Status: DC

## 2020-02-17 MED ORDER — ESCITALOPRAM OXALATE 20 MG PO TABS: 20.0000 mg | ORAL_TABLET | Freq: Every day | ORAL | 2 refills | Status: DC

## 2020-02-17 NOTE — Progress Notes (Signed)
     02/17/2020 Name: Preston Berry MRN: [id number] DOB: 17-Aug-1985   S:  75 yoM presents for diabetes evaluation, education, and management Patient was referred and last seen by Primary Care Provider on 02/14/20.  His fiance is with him today for his appointment and very involved in his care Patient reports Diabetes was diagnosed this week  Insurance coverage/medication affordability: medicaid/care/disability  Patient reports adherence with medications. . Current diabetes medications include: new start metformin . Current hypertension medications include: lisinopril Goal 130/80 . 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   Patient reported dietary habits: Eats 3 meals/day Extensive diet counseling provided Discussed meal planning options and Plate method for healthy eating . Avoid sugary drinks and desserts . Incorporate balanced protein, non starchy veggies, 1 serving of carbohydrate with each meal . Increase water intake . Increase physical activity as able  Patient-reported exercise habits: outdoor activities, working to increase this  Patient reports nocturia (nighttime urination).    O:  Lab Results  Component Value Date   HGBA1C 10.7 (H) 02/15/2020    Vitals:   02/17/20 1548  BP: 119/78  Pulse: 87    Lipid Panel     Component Value Date/Time   CHOL 146 09/12/2019 1645   TRIG 233 (H) 09/12/2019 1645   HDL 32 (L) 09/12/2019 1645   CHOLHDL 4.6 09/12/2019 1645   CHOLHDL 7.1 01/01/2016 0537   VLDL 45 (H) 01/01/2016 0537   LDLCALC 75 09/12/2019 1645    Home fasting blood sugars: 266  2 hour post-meal/random blood sugars: 400s.  BG s/p lunch and banana is 545 today  A/P:  Diabetes T2DM newly diagnosed  -Patient using one touch verio FLEX glucometer system. Able to demonstrate use.  -Continue  metformin as prescribed  -Patient does not wish to start insulin or injectable at this time although insulin is recommended  -Will add Glyxambi 25/5mg  and consider PO GLP at f/u visit if patient not controlled  Samples given--LOT#[id number], exp5/22  RX sent to [place]  -Patient instructed to go to emergency room if BGs remain elevated.  Informed him of DKA potential.  -Extensively discussed pathophysiology of diabetes, recommended lifestyle interventions, dietary effects on blood sugar control  -Counseled on s/sx of and management of hypoglycemia  -Next A1C anticipated 3 months  Written patient instructions provided.  Total time in face to face counseling 45 minutes.   Follow up Pharmacist/PCP Clinic Visit in 2 weeks  Regina Eck, PharmD, BCPS Clinical Pharmacist, [place]  II Phone [phone]

## 2020-02-17 NOTE — Telephone Encounter (Signed)
Instructed pt to go ahead and take the Meformin and check his blood sugar in about an hour. Instructed pt if his sugar dropped too low to make [place] aware.

## 2020-02-17 NOTE — Telephone Encounter (Signed)
Pt is wanting to know what the safe level for his blood sugar to be when taking metformin. Pt states right now his sugar is 266 and he has not taken the medication today yet and wanted to be sure he is ok to take it and where his levels should be after taking the medication.

## 2020-02-20 ENCOUNTER — Telehealth: Payer: Self-pay | Admitting: Family Medicine

## 2020-02-20 NOTE — Telephone Encounter (Signed)
BG 7:02pm 160 8/14 BG  12: 06pm 180 8/14 BG 210 8:43pm  8/15 BG 148 12:48pm 8/16 BG 219 12:12am 8/16 BG 186 FBG  Continue medications as prescribed Continue diet modifications

## 2020-02-24 ENCOUNTER — Encounter: Payer: Self-pay | Admitting: Family Medicine

## 2020-02-24 ENCOUNTER — Ambulatory Visit: Payer: Medicare Other | Admitting: Family Medicine

## 2020-02-24 ENCOUNTER — Telehealth: Payer: Self-pay | Admitting: Family Medicine

## 2020-02-24 NOTE — Telephone Encounter (Signed)
Call returned to patient No answer, left VM

## 2020-02-24 NOTE — Telephone Encounter (Signed)
Would like to talk to Preston Berry about his diabetic machine.  Please call.

## 2020-03-01 IMAGING — CT CT ABDOMEN AND PELVIS WITH CONTRAST
2 of 4 series · 15 of 46 positions shown, 17 images · IV contrast (Isovue)
Comparison: August 25, 2017

CLINICAL DATA: Abdominal pain, primarily left lower abdomen.
Reported blood in stool

EXAM:
CT ABDOMEN AND PELVIS WITH CONTRAST
TECHNIQUE: Multidetector CT imaging of the abdomen and pelvis was performed
using the standard protocol following bolus administration of
intravenous contrast. Oral contrast was also administered.
CONTRAST:  30mL OMNIPAQUE IOHEXOL 300 MG/ML SOLN, 100mL OMNIPAQUE
IOHEXOL 300 MG/ML SOLN

[Series 3: axial st · axial · 0.71mm/px · z∈[+1113,+1523]mm · 12 of 94 slices shown, 14 images]
[im 6/94  soft-tissue]
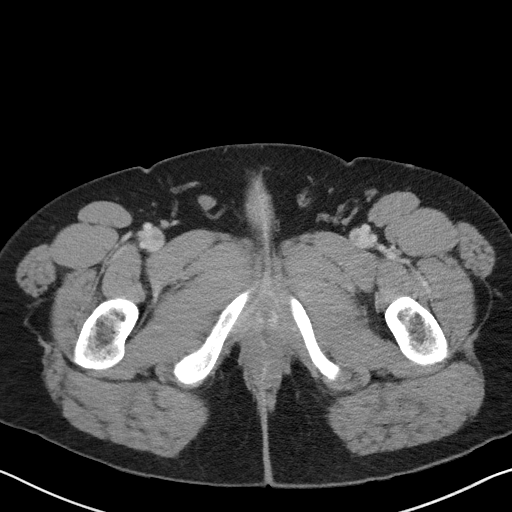
[im 6/94  bone]
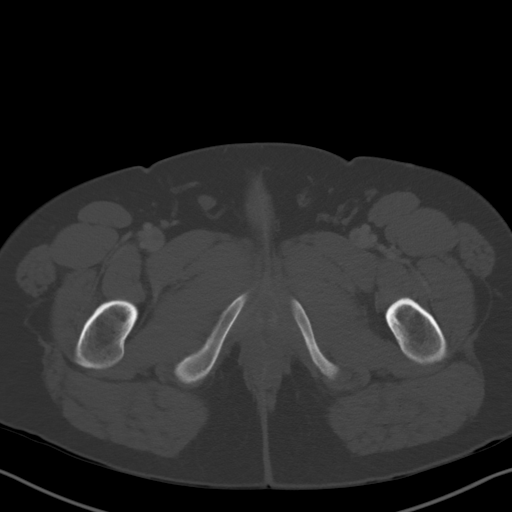
[im 16/94  soft-tissue]
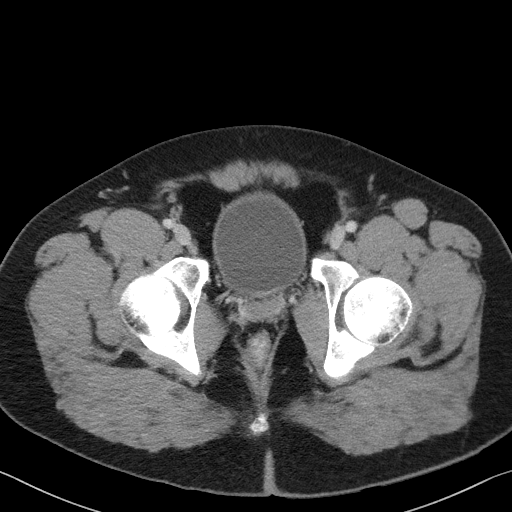
[im 21/94  soft-tissue]
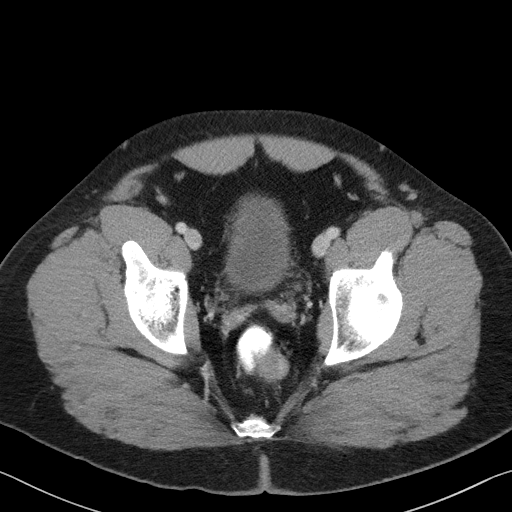
[im 26/94  soft-tissue]
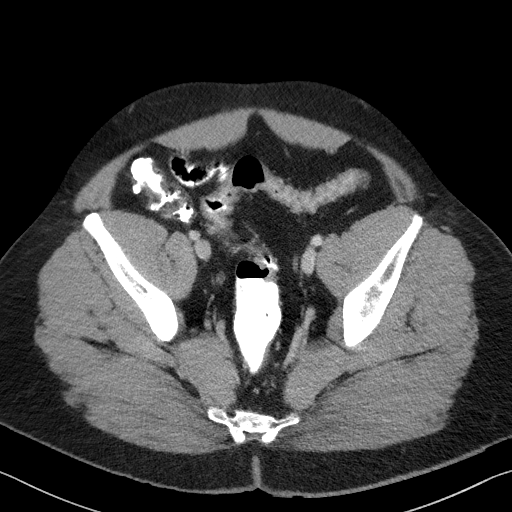
[im 37/94  soft-tissue]
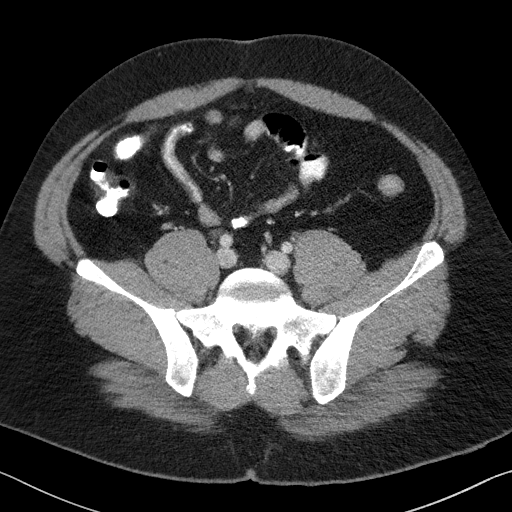
[im 42/94  soft-tissue]
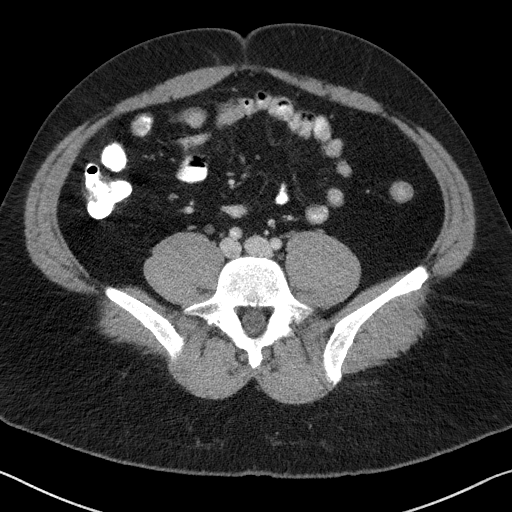
[im 52/94  soft-tissue]
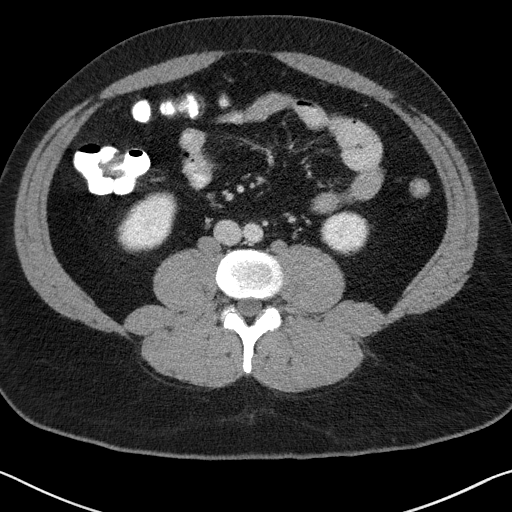
[im 57/94  soft-tissue]
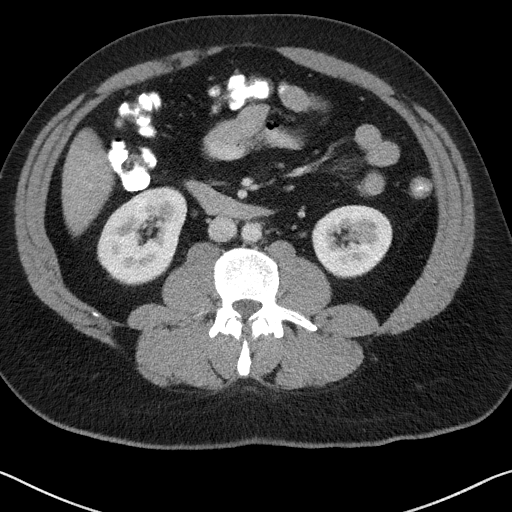
[im 68/94  soft-tissue]
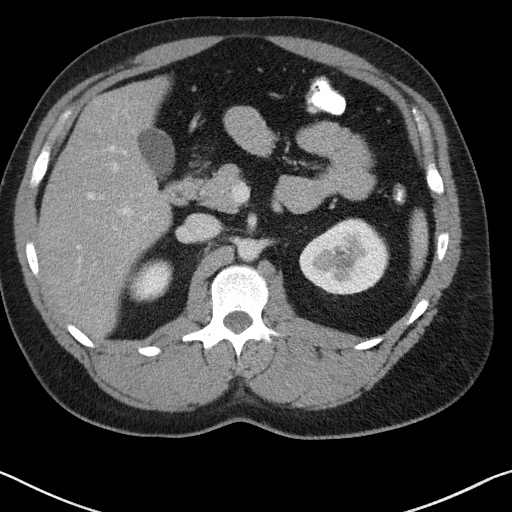
[im 68/94  bone]
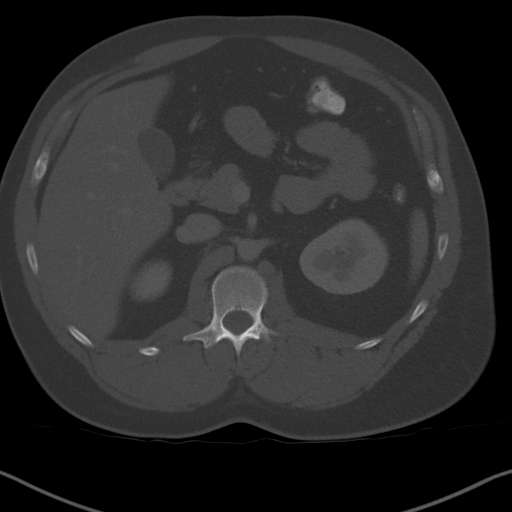
[im 73/94  soft-tissue]
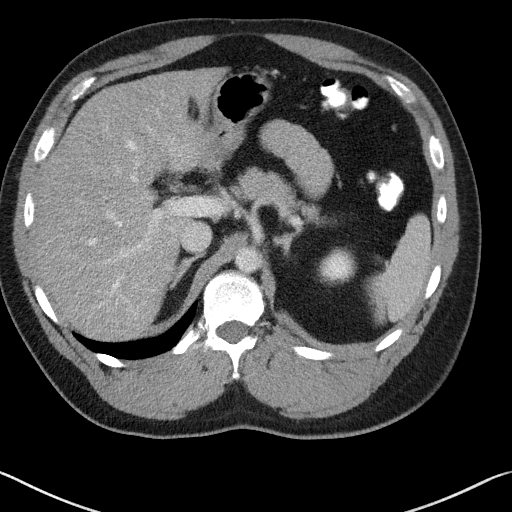
[im 78/94  soft-tissue]
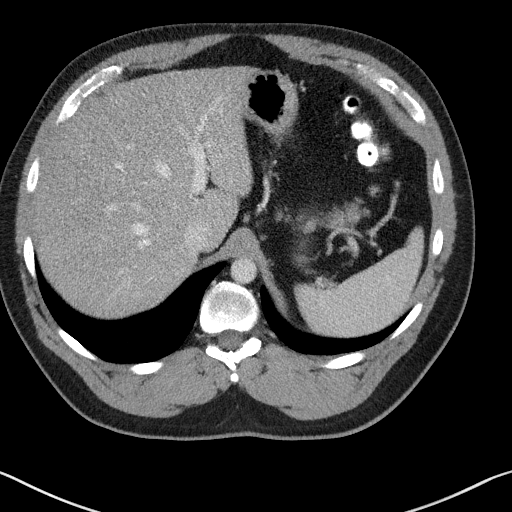
[im 88/94  soft-tissue]
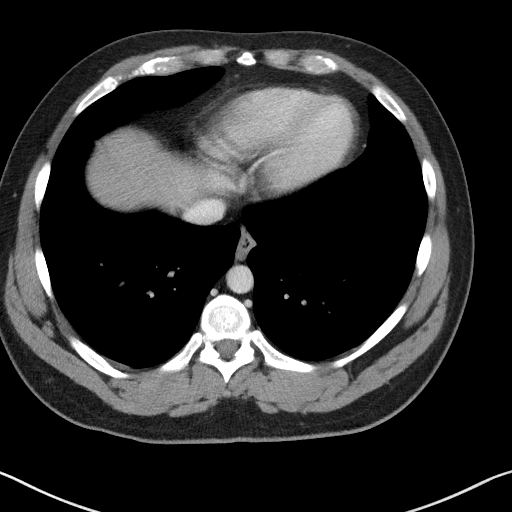

[Series 6: coronal st · coronal · 0.68mm/px · 3 of 117 slices shown]
[im 39/117  soft-tissue]
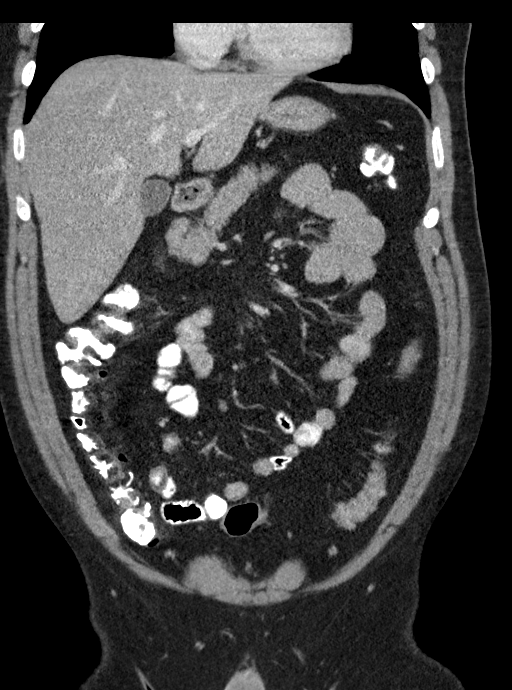
[im 52/117  soft-tissue]
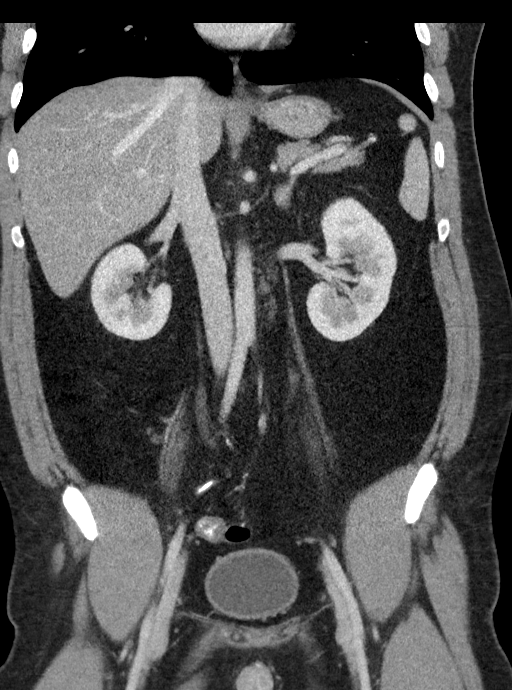
[im 65/117  soft-tissue]
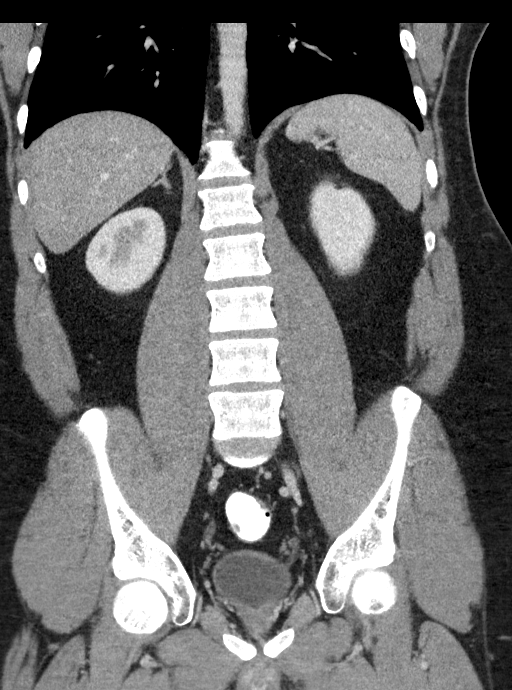

[15 of 46 positions shown; findings below may reference images not displayed]

FINDINGS: Lower chest: Lung bases are clear.

Hepatobiliary: There is hepatic steatosis. No focal liver lesions
are evident. The gallbladder wall is not appreciably thickened. No
biliary duct dilatation evident.

Pancreas: There is no pancreatic mass or inflammatory focus.

Spleen: No splenic lesions are evident. There is a small accessory
spleen anteriorly.

Adrenals/Urinary Tract: Adrenals bilaterally appear unremarkable.
Kidneys bilaterally show no evident mass or hydronephrosis on either
side. There is no evident renal or ureteral calculus on either side.
Urinary bladder is midline with wall thickness within normal limits.

Stomach/Bowel: There is perirectal soft tissue stranding, primarily
on the left. A well-defined rectal mass is not seen by CT. There is,
however, edema in the rectal wall toward the left.

Elsewhere there is no bowel wall or mesenteric thickening. No
evident bowel obstruction. Terminal ileum appears normal. There is
no evident free air or portal venous air.

Vascular/Lymphatic: There is no abdominal aortic aneurysm. No
vascular lesions are demonstrable. There is a retroaortic left renal
vein, an anatomic variant. No adenopathy evident in the abdomen or
pelvis.

Reproductive: Prostate and seminal vesicles are normal in size and
contour. No evident pelvic mass.

Other: The appendix appears normal. No abscess or ascites evident in
the abdomen or pelvis.

Musculoskeletal: No blastic or lytic bone lesions. No intramuscular
lesions are evident.
IMPRESSION: 1. There is apparent degree of edema in the leftward aspect of the
rectal wall with soft tissue stranding in the perirectal region.
Suspect a degree of proctitis. Clinical assessment in this regard
advised.

2. No bowel obstruction. No abscess in the abdomen or pelvis.
Appendix appears normal.

3. No renal or ureteral calculus. No hydronephrosis. Urinary bladder
wall thickness normal.

4.  Hepatic steatosis.

## 2020-03-06 ENCOUNTER — Other Ambulatory Visit: Payer: Self-pay | Admitting: Family Medicine

## 2020-03-06 DIAGNOSIS — K219 Gastro-esophageal reflux disease without esophagitis: Secondary | ICD-10-CM

## 2020-03-07 ENCOUNTER — Ambulatory Visit (INDEPENDENT_AMBULATORY_CARE_PROVIDER_SITE_OTHER): Payer: Medicare Other | Admitting: Gastroenterology

## 2020-03-07 ENCOUNTER — Telehealth: Payer: Self-pay | Admitting: Family Medicine

## 2020-03-07 MED ORDER — ONETOUCH VERIO VI STRP: ORAL_STRIP | 12 refills | Status: DC

## 2020-03-07 NOTE — Telephone Encounter (Signed)
  Prescription Request  03/07/2020  What is the name of the medication or equipment? Test strips  Have you contacted your pharmacy to request a refill? (if applicable) no  Which pharmacy would you like this sent to? [facility] in [place]   Patient notified that their request is being sent to the clinical staff for review and that they should receive a response within 2 business days.

## 2020-03-07 NOTE — Telephone Encounter (Signed)
Refill for test strips sent to [facility] in [place]

## 2020-03-16 ENCOUNTER — Ambulatory Visit: Payer: Medicare Other | Admitting: Family Medicine

## 2020-03-20 ENCOUNTER — Telehealth: Payer: Self-pay | Admitting: Family Medicine

## 2020-03-20 NOTE — Telephone Encounter (Signed)
Patient states that rash has worsened on hands and side of face.  Patient has been treated for scabies in the past and states that cream prescribed is no longer working.  MyChart Video visit appt made

## 2020-03-21 ENCOUNTER — Encounter: Payer: Self-pay | Admitting: Family Medicine

## 2020-03-21 ENCOUNTER — Telehealth (INDEPENDENT_AMBULATORY_CARE_PROVIDER_SITE_OTHER): Payer: Medicare Other | Admitting: Family Medicine

## 2020-03-21 DIAGNOSIS — R21 Rash and other nonspecific skin eruption: Secondary | ICD-10-CM

## 2020-03-21 MED ORDER — PREDNISONE 10 MG (21) PO TBPK: ORAL_TABLET | ORAL | 0 refills | Status: DC

## 2020-03-21 NOTE — Progress Notes (Signed)
Virtual Visit via Telephone Note  I connected with Preston Berry on 03/24/20 at 5:27 PM by telephone and verified that I am speaking with the correct person using two identifiers. Preston Berry is currently located at home and his fiance is currently with him during this visit. The provider, Loman Brooklyn, FNP is located in their office at time of visit.  I discussed the limitations, risks, security and privacy concerns of performing an evaluation and management service by telephone and the availability of in person appointments. I also discussed with the patient that there may be a patient responsible charge related to this service. The patient expressed understanding and agreed to proceed.  Subjective: PCP: Loman Brooklyn, FNP  Chief Complaint  Patient presents with  . Rash   Patient reports a rash on his sides, arms, hands, chest, face, and intergluteal cleft that has been present x1 week.  The rash is VERY itchy and still spreading.  He describes it as little red bumps.  They are not linear.  He states they do not look similar at all to when he had scabies.  He denies any respiratory symptoms or fever.  He did try using his scabies treatment he still has at home as well as Benadryl itch cream which have not been effective.   ROS: Per HPI  Current Outpatient Medications:  .  blood glucose meter kit and supplies KIT, Dispense based on insurance preference. Use up to four times daily (FOR ICD-9 250.00, 250.01)., Disp: 1 each, Rfl: 0 .  Blood Glucose Monitoring Suppl ([place]) w/Device KIT, Use to test blood sugar daily. DX:E11.9, Disp: 1 kit, Rfl: 1 .  busPIRone (BUSPAR) 15 MG tablet, Take 1 tablet (15 mg total) by mouth 3 (three) times daily., Disp: 90 tablet, Rfl: 6 .  Empagliflozin-linaGLIPtin (GLYXAMBI) 25-5 MG TABS, Take 1 tablet by mouth daily., Disp: 30 tablet, Rfl: 3 .  escitalopram (LEXAPRO) 20 MG tablet, Take 1 tablet (20 mg total) by mouth daily., Disp:  30 tablet, Rfl: 2 .  famotidine (PEPCID) 40 MG tablet, Take 1 tablet (40 mg total) by mouth at bedtime., Disp: 30 tablet, Rfl: 6 .  glucose blood (ONETOUCH VERIO) test strip, Use to test blood sugar daily. DX:E11.9, Disp: 100 each, Rfl: 12 .  ibuprofen (ADVIL) 400 MG tablet, Take 400 mg by mouth every 8 (eight) hours as needed for pain., Disp: , Rfl:  .  lisinopril (ZESTRIL) 10 MG tablet, Take 1 tablet (10 mg total) by mouth daily., Disp: 90 tablet, Rfl: 1 .  metFORMIN (GLUCOPHAGE-XR) 500 MG 24 hr tablet, Take 500 mg (1 tablet) by mouth x1 week, then increase to 500 mg twice daily. Take with a meal., Disp: 60 tablet, Rfl: 0 .  OneTouch Delica Lancets 30N MISC, Use to test blood sugar  daily. DX:E11.9, Disp: 100 each, Rfl: 11 .  pantoprazole (PROTONIX) 40 MG tablet, Take 1 tablet by mouth once daily, Disp: 30 tablet, Rfl: 0 .  predniSONE (STERAPRED UNI-PAK 21 TAB) 10 MG (21) TBPK tablet, As directed x 6 days, Disp: 21 tablet, Rfl: 0  Allergies  Allergen Reactions  . Penicillins Anaphylaxis and Swelling  . Keflex [Cephalexin]     Patient states he is allergic to cephlasporins. Unknown reaction  . Statins     Muscle pain/cramps   Past Medical History:  Diagnosis Date  . Abdominal pain 02/19/2015  . Abnormal EKG 01/27/2019  . Arthritis   . Bipolar disorder ([place]) 04/10/2014  .  Chest pain, rule out acute myocardial infarction 12/28/2018  . Colitis   . Constipation 02/08/2019  . Depression   . Eczema 09/18/2015  . Essential hypertension 01/13/2019  . Fatty liver   . GERD (gastroesophageal reflux disease)   . Gynecomastia   . H/O lead exposure 02/19/2015  . Hepatic steatosis   . Hepatomegaly   . Hyperlipidemia   . Hypertension   . LLQ abdominal pain 02/08/2019  . Panic attacks 04/10/2014  . Rectal bleeding 02/08/2019  . Transaminitis 02/19/2015    Observations/Objective: A&O  No respiratory distress or wheezing audible over the phone Mood, judgement, and thought processes all  WNL   Assessment and Plan: 1. Rash - Discussed if rash does not improve or continues spreading he needs to be seen in person. - predniSONE (STERAPRED UNI-PAK 21 TAB) 10 MG (21) TBPK tablet; As directed x 6 days  Dispense: 21 tablet; Refill: 0   Follow Up Instructions:  I discussed the assessment and treatment plan with the patient. The patient was provided an opportunity to ask questions and all were answered. The patient agreed with the plan and demonstrated an understanding of the instructions.   The patient was advised to call back or seek an in-person evaluation if the symptoms worsen or if the condition fails to improve as anticipated.  The above assessment and management plan was discussed with the patient. The patient verbalized understanding of and has agreed to the management plan. Patient is aware to call the clinic if symptoms persist or worsen. Patient is aware when to return to the clinic for a follow-up visit. Patient educated on when it is appropriate to go to the emergency department.   Time call ended: 5:38 PM  I provided 13 minutes of non-face-to-face time during this encounter.  Hendricks Limes, MSN, APRN, FNP-C [facility] 03/24/20

## 2020-03-27 DIAGNOSIS — Z79899 Other long term (current) drug therapy: Secondary | ICD-10-CM | POA: Diagnosis not present

## 2020-03-27 DIAGNOSIS — R011 Cardiac murmur, unspecified: Secondary | ICD-10-CM | POA: Diagnosis not present

## 2020-03-27 DIAGNOSIS — Z8249 Family history of ischemic heart disease and other diseases of the circulatory system: Secondary | ICD-10-CM | POA: Diagnosis not present

## 2020-03-27 DIAGNOSIS — I2510 Atherosclerotic heart disease of native coronary artery without angina pectoris: Secondary | ICD-10-CM | POA: Diagnosis not present

## 2020-03-27 DIAGNOSIS — I10 Essential (primary) hypertension: Secondary | ICD-10-CM | POA: Diagnosis not present

## 2020-03-27 DIAGNOSIS — E7849 Other hyperlipidemia: Secondary | ICD-10-CM | POA: Diagnosis not present

## 2020-03-27 DIAGNOSIS — Z8679 Personal history of other diseases of the circulatory system: Secondary | ICD-10-CM | POA: Diagnosis not present

## 2020-03-27 DIAGNOSIS — Z87891 Personal history of nicotine dependence: Secondary | ICD-10-CM | POA: Diagnosis not present

## 2020-03-31 DIAGNOSIS — Z811 Family history of alcohol abuse and dependence: Secondary | ICD-10-CM | POA: Diagnosis not present

## 2020-03-31 DIAGNOSIS — Z8679 Personal history of other diseases of the circulatory system: Secondary | ICD-10-CM | POA: Diagnosis not present

## 2020-03-31 DIAGNOSIS — I10 Essential (primary) hypertension: Secondary | ICD-10-CM | POA: Diagnosis not present

## 2020-03-31 DIAGNOSIS — E7849 Other hyperlipidemia: Secondary | ICD-10-CM | POA: Diagnosis not present

## 2020-03-31 DIAGNOSIS — R011 Cardiac murmur, unspecified: Secondary | ICD-10-CM | POA: Diagnosis not present

## 2020-03-31 DIAGNOSIS — Z79899 Other long term (current) drug therapy: Secondary | ICD-10-CM | POA: Diagnosis not present

## 2020-03-31 DIAGNOSIS — I2510 Atherosclerotic heart disease of native coronary artery without angina pectoris: Secondary | ICD-10-CM | POA: Diagnosis not present

## 2020-03-31 DIAGNOSIS — Z87891 Personal history of nicotine dependence: Secondary | ICD-10-CM | POA: Diagnosis not present

## 2020-03-31 DIAGNOSIS — Z8249 Family history of ischemic heart disease and other diseases of the circulatory system: Secondary | ICD-10-CM | POA: Diagnosis not present

## 2020-04-06 ENCOUNTER — Other Ambulatory Visit: Payer: Self-pay

## 2020-04-06 ENCOUNTER — Encounter: Payer: Self-pay | Admitting: Family Medicine

## 2020-04-06 ENCOUNTER — Ambulatory Visit (INDEPENDENT_AMBULATORY_CARE_PROVIDER_SITE_OTHER): Payer: Medicare Other | Admitting: Family Medicine

## 2020-04-06 VITALS — BP 124/87 | HR 84 | Temp 97.8°F | Ht 68.0 in | Wt 218.6 lb

## 2020-04-06 DIAGNOSIS — E1165 Type 2 diabetes mellitus with hyperglycemia: Secondary | ICD-10-CM | POA: Diagnosis not present

## 2020-04-06 DIAGNOSIS — F339 Major depressive disorder, recurrent, unspecified: Secondary | ICD-10-CM | POA: Diagnosis not present

## 2020-04-06 DIAGNOSIS — E669 Obesity, unspecified: Secondary | ICD-10-CM | POA: Diagnosis not present

## 2020-04-06 DIAGNOSIS — F3175 Bipolar disorder, in partial remission, most recent episode depressed: Secondary | ICD-10-CM

## 2020-04-06 DIAGNOSIS — R0781 Pleurodynia: Secondary | ICD-10-CM | POA: Diagnosis not present

## 2020-04-06 DIAGNOSIS — F411 Generalized anxiety disorder: Secondary | ICD-10-CM | POA: Diagnosis not present
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
## 2020-04-08 ENCOUNTER — Encounter: Payer: Self-pay | Admitting: Family Medicine

## 2020-04-08 DIAGNOSIS — E1165 Type 2 diabetes mellitus with hyperglycemia: Secondary | ICD-10-CM | POA: Insufficient documentation

## 2020-04-08 DIAGNOSIS — R0781 Pleurodynia: Secondary | ICD-10-CM | POA: Insufficient documentation

## 2020-04-08 DIAGNOSIS — E669 Obesity, unspecified: Secondary | ICD-10-CM | POA: Insufficient documentation

## 2020-04-09 ENCOUNTER — Ambulatory Visit (INDEPENDENT_AMBULATORY_CARE_PROVIDER_SITE_OTHER): Payer: Medicare Other | Admitting: Gastroenterology

## 2020-04-13 ENCOUNTER — Other Ambulatory Visit: Payer: Self-pay

## 2020-04-13 ENCOUNTER — Other Ambulatory Visit: Payer: Self-pay | Admitting: Family Medicine

## 2020-04-13 DIAGNOSIS — E1165 Type 2 diabetes mellitus with hyperglycemia: Secondary | ICD-10-CM

## 2020-04-13 DIAGNOSIS — K219 Gastro-esophageal reflux disease without esophagitis: Secondary | ICD-10-CM

## 2020-04-13 MED ORDER — METFORMIN HCL ER 500 MG PO TB24: 500.0000 mg | ORAL_TABLET | Freq: Two times a day (BID) | ORAL | 2 refills | Status: DC

## 2020-04-13 MED ORDER — PANTOPRAZOLE SODIUM 40 MG PO TBEC: 40.0000 mg | DELAYED_RELEASE_TABLET | Freq: Every day | ORAL | 2 refills | Status: DC

## 2020-04-13 NOTE — Telephone Encounter (Signed)
New diabetic, do metformin directions need to be changed

## 2020-04-13 NOTE — Telephone Encounter (Signed)
  Prescription Request  04/13/2020  What is the name of the medication or equipment? Metformin and acid reflux rx  Have you contacted your pharmacy to request a refill? (if applicable) yes  Which pharmacy would you like this sent to? [facility] [place]   Patient notified that their request is being sent to the clinical staff for review and that they should receive a response within 2 business days.

## 2020-04-16 DIAGNOSIS — I10 Essential (primary) hypertension: Secondary | ICD-10-CM | POA: Diagnosis not present

## 2020-05-07 ENCOUNTER — Ambulatory Visit (INDEPENDENT_AMBULATORY_CARE_PROVIDER_SITE_OTHER): Payer: Medicare Other | Admitting: Gastroenterology

## 2020-05-09 ENCOUNTER — Ambulatory Visit: Payer: Medicare Other | Admitting: Family Medicine

## 2020-05-14 ENCOUNTER — Telehealth: Payer: Self-pay

## 2020-05-14 MED ORDER — GLUCOSE BLOOD VI STRP: ORAL_STRIP | 12 refills | Status: DC

## 2020-05-14 NOTE — Telephone Encounter (Signed)
Pt called stating that he had to buy a new blood glucose meter. Says he was told by the pharmacy to contact his PCP to send in order for the strips needed for the meter.  Pt needs Rely on premier classic strips.  Please send to [facility] in [place].  Please call pt when order is sent.

## 2020-05-14 NOTE — Telephone Encounter (Signed)
Test strips sent patient aware  

## 2020-05-16 ENCOUNTER — Other Ambulatory Visit: Payer: Self-pay | Admitting: *Deleted

## 2020-05-16 MED ORDER — GLUCOSE BLOOD VI STRP: ORAL_STRIP | 12 refills | Status: DC

## 2020-05-21 ENCOUNTER — Ambulatory Visit (INDEPENDENT_AMBULATORY_CARE_PROVIDER_SITE_OTHER): Payer: Medicare Other | Admitting: Gastroenterology

## 2020-05-23 ENCOUNTER — Ambulatory Visit: Payer: Medicare Other | Admitting: Family Medicine

## 2020-06-05 DIAGNOSIS — Z20822 Contact with and (suspected) exposure to covid-19: Secondary | ICD-10-CM | POA: Diagnosis not present

## 2020-06-07 ENCOUNTER — Ambulatory Visit: Payer: Medicare Other | Admitting: Nurse Practitioner

## 2020-06-18 ENCOUNTER — Ambulatory Visit: Payer: Medicare Other | Admitting: Nurse Practitioner

## 2020-06-18 ENCOUNTER — Other Ambulatory Visit: Payer: Self-pay | Admitting: Family Medicine

## 2020-06-18 DIAGNOSIS — F411 Generalized anxiety disorder: Secondary | ICD-10-CM

## 2020-06-18 DIAGNOSIS — F339 Major depressive disorder, recurrent, unspecified: Secondary | ICD-10-CM

## 2020-07-08 DIAGNOSIS — E118 Type 2 diabetes mellitus with unspecified complications: Secondary | ICD-10-CM | POA: Diagnosis not present

## 2020-07-10 ENCOUNTER — Ambulatory Visit: Payer: Medicare Other | Admitting: Nurse Practitioner

## 2020-07-17 ENCOUNTER — Other Ambulatory Visit: Payer: Self-pay | Admitting: Family Medicine

## 2020-07-17 DIAGNOSIS — F339 Major depressive disorder, recurrent, unspecified: Secondary | ICD-10-CM

## 2020-07-17 DIAGNOSIS — F411 Generalized anxiety disorder: Secondary | ICD-10-CM

## 2020-07-18 MED ORDER — ESCITALOPRAM OXALATE 20 MG PO TABS: 20.0000 mg | ORAL_TABLET | Freq: Every day | ORAL | 0 refills | Status: DC

## 2020-07-26 ENCOUNTER — Ambulatory Visit (INDEPENDENT_AMBULATORY_CARE_PROVIDER_SITE_OTHER): Payer: Medicare Other | Admitting: Gastroenterology

## 2020-08-07 DIAGNOSIS — E119 Type 2 diabetes mellitus without complications: Secondary | ICD-10-CM | POA: Diagnosis not present

## 2020-08-10 ENCOUNTER — Ambulatory Visit (INDEPENDENT_AMBULATORY_CARE_PROVIDER_SITE_OTHER): Payer: Medicare Other | Admitting: Nurse Practitioner

## 2020-08-10 ENCOUNTER — Encounter: Payer: Self-pay | Admitting: Nurse Practitioner

## 2020-08-10 ENCOUNTER — Other Ambulatory Visit: Payer: Self-pay

## 2020-08-10 ENCOUNTER — Telehealth: Payer: Self-pay

## 2020-08-10 ENCOUNTER — Ambulatory Visit: Payer: Medicare Other | Admitting: Nurse Practitioner

## 2020-08-10 DIAGNOSIS — F411 Generalized anxiety disorder: Secondary | ICD-10-CM

## 2020-08-10 DIAGNOSIS — F339 Major depressive disorder, recurrent, unspecified: Secondary | ICD-10-CM

## 2020-08-10 DIAGNOSIS — K219 Gastro-esophageal reflux disease without esophagitis: Secondary | ICD-10-CM

## 2020-08-10 DIAGNOSIS — E1165 Type 2 diabetes mellitus with hyperglycemia: Secondary | ICD-10-CM | POA: Diagnosis not present

## 2020-08-10 MED ORDER — METFORMIN HCL ER 500 MG PO TB24: 500.0000 mg | ORAL_TABLET | Freq: Two times a day (BID) | ORAL | 2 refills | Status: AC

## 2020-08-10 MED ORDER — GLYXAMBI 25-5 MG PO TABS: 1.0000 | ORAL_TABLET | Freq: Every day | ORAL | 3 refills | Status: DC

## 2020-08-10 MED ORDER — LISINOPRIL 10 MG PO TABS: 10.0000 mg | ORAL_TABLET | Freq: Every day | ORAL | 0 refills | Status: DC

## 2020-08-10 MED ORDER — ESCITALOPRAM OXALATE 20 MG PO TABS: 20.0000 mg | ORAL_TABLET | Freq: Every day | ORAL | 0 refills | Status: DC

## 2020-08-10 MED ORDER — PANTOPRAZOLE SODIUM 40 MG PO TBEC: 40.0000 mg | DELAYED_RELEASE_TABLET | Freq: Every day | ORAL | 2 refills | Status: DC
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
## 2020-08-10 NOTE — Assessment & Plan Note (Addendum)
No new depression symptoms.  Patient currently managed on Lexapro 20 mg tablet by mouth daily.  Completed PHQ-9 continue to provide education to patient with printed handouts given.  Rx refill sent to pharmacy.

## 2020-08-10 NOTE — Telephone Encounter (Signed)
Patient needed an appointment for his first Covid vaccine and also a one month follow up appointment with his PCP, Paschal Dopp.  Appointment for both scheduled on 09/12/20.

## 2020-08-10 NOTE — Patient Instructions (Addendum)
https://www Diabetes Mellitus Basics  Diabetes mellitus, or diabetes, is a long-term (chronic) disease. It occurs when the body does not properly use sugar (glucose) that is released from food after you eat. Diabetes mellitus may be caused by one or both of these problems:  Your pancreas does not make enough of a hormone called insulin.  Your body does not react in a normal way to the insulin that it makes. Insulin lets glucose enter cells in your body. This gives you energy. If you have diabetes, glucose cannot get into cells. This causes high blood glucose (hyperglycemia). How to treat and manage diabetes You may need to take insulin or other diabetes medicines daily to keep your glucose in balance. If you are prescribed insulin, you will learn how to give yourself insulin by injection. You may need to adjust the amount of insulin you take based on the foods that you eat. You will need to check your blood glucose levels using a glucose monitor as told by your health care provider. The readings can help determine if you have low or high blood glucose. Generally, you should have these blood glucose levels:  Before meals (preprandial): 80-130 mg/dL (4.4-7.2 mmol/L).  After meals (postprandial): below 180 mg/dL (10 mmol/L).  Hemoglobin A1c (HbA1c) level: less than 7%. Your health care provider will set treatment goals for you. Keep all follow-up visits. This is important. Follow these instructions at home: Diabetes medicines Take your diabetes medicines every day as told by your health care provider. List your diabetes medicines here:  Name of medicine: ______________________________ ? Amount (dose): _______________ Time (a.m./p.m.): _______________ Notes: ___________________________________  Name of medicine: ______________________________ ? Amount (dose): _______________ Time (a.m./p.m.): _______________ Notes: ___________________________________  Name of medicine:  ______________________________ ? Amount (dose): _______________ Time (a.m./p.m.): _______________ Notes: ___________________________________ Insulin If you use insulin, list the types of insulin you use here:  Insulin type: ______________________________ ? Amount (dose): _______________ Time (a.m./p.m.): _______________Notes: ___________________________________  Insulin type: ______________________________ ? Amount (dose): _______________ Time (a.m./p.m.): _______________ Notes: ___________________________________  Insulin type: ______________________________ ? Amount (dose): _______________ Time (a.m./p.m.): _______________ Notes: ___________________________________  Insulin type: ______________________________ ? Amount (dose): _______________ Time (a.m./p.m.): _______________ Notes: ___________________________________  Insulin type: ______________________________ ? Amount (dose): _______________ Time (a.m./p.m.): _______________ Notes: ___________________________________ Managing blood glucose Check your blood glucose levels using a glucose monitor as told by your health care provider. Write down the times that you check your glucose levels here:  Time: _______________ Notes: ___________________________________  Time: _______________ Notes: ___________________________________  Time: _______________ Notes: ___________________________________  Time: _______________ Notes: ___________________________________  Time: _______________ Notes: ___________________________________  Time: _______________ Notes: ___________________________________   Low blood glucose Low blood glucose (hypoglycemia) is when glucose is at or below 70 mg/dL (3.9 mmol/L). Symptoms may include:  Feeling: ? Hungry. ? Sweaty and clammy. ? Irritable or easily upset. ? Dizzy. ? Sleepy.  Having: ? A fast heartbeat. ? A headache. ? A change in your vision. ? Numbness around the mouth, lips, or  tongue.  Having trouble with: ? Moving (coordination). ? Sleeping. Treating low blood glucose To treat low blood glucose, eat or drink something containing sugar right away. If you can think clearly and swallow safely, follow the 15:15 rule:  Take 15 grams of a fast-acting carb (carbohydrate), as told by your health care provider.  Some fast-acting carbs are: ? Glucose tablets: take 3-4 tablets. ? Hard candy: eat 3-5 pieces. ? Fruit juice: drink 4 oz (120 mL). ? Regular (not diet) soda: drink 4-6 oz (120-180 mL). ? Honey or  sugar: eat 1 Tbsp (15 mL).  Check your blood glucose levels 15 minutes after you take the carb.  If your glucose is still at or below 70 mg/dL (3.9 mmol/L), take 15 grams of a carb again.  If your glucose does not go above 70 mg/dL (3.9 mmol/L) after 3 tries, get help right away.  After your glucose goes back to normal, eat a meal or a snack within 1 hour. Treating very low blood glucose If your glucose is at or below 54 mg/dL (3 mmol/L), you have very low blood glucose (severe hypoglycemia). This is an emergency. Do not wait to see if the symptoms will go away. Get medical help right away. Call your local emergency services (911 in the [place] Do not drive yourself to the hospital. Questions to ask your health care provider  Should I talk with a diabetes educator?  What equipment will I need to care for myself at home?  What diabetes medicines do I need? When should I take them?  How often do I need to check my blood glucose levels?  What number can I call if I have questions?  When is my follow-up visit?  Where can I find a support group for people with diabetes? Where to find more information  American Diabetes Association: www.diabetes.org  Association of Diabetes Care and Education Specialists: www.diabeteseducator.org Contact a health care provider if:  Your blood glucose is at or above 240 mg/dL (13.3 mmol/L) for 2 days in a row.  You have  been sick or have had a fever for 2 days or more, and you are not getting better.  You have any of these problems for more than 6 hours: ? You cannot eat or drink. ? You feel nauseous. ? You vomit. ? You have diarrhea. Get help right away if:  Your blood glucose is lower than 54 mg/dL (3 mmol/L).  You get confused.  You have trouble thinking clearly.  You have trouble breathing. These symptoms may represent a serious problem that is an emergency. Do not wait to see if the symptoms will go away. Get medical help right away. Call your local emergency services (911 in the [place] Do not drive yourself to the hospital. Summary  Diabetes mellitus is a chronic disease that occurs when the body does not properly use sugar (glucose) that is released from food after you eat.  Take insulin and diabetes medicines as told.  Check your blood glucose every day, as often as told.  Keep all follow-up visits. This is important. This information is not intended to replace advice given to you by your health care provider. Make sure you discuss any questions you have with your health care provider. Document Revised: 10/25/2019 Document Reviewed: 10/25/2019 Elsevier Patient Education  2021 [employer]. .https://www.romero.com/.shtml">  Depression Screening Depression screening is a tool that your health care provider can use to learn if you have symptoms of depression. Depression is a common condition with many symptoms that are also often found in other conditions. Depression is treatable, but it must first be diagnosed. You may not know that certain feelings, thoughts, and behaviors that you are having can be symptoms of depression. Taking a depression screening test can help you and your health care provider decide if you need more assessment, or if you should be referred to a mental health care provider. What are the screening tests?  You may have a  physical exam to see if another condition is affecting your mental health.  You may have a blood or urine sample taken during the physical exam.  You may be interviewed using a screening tool that was developed from research, such as one of these: ? Patient Health Questionnaire (PHQ). This is a set of either 2 or 9 questions. A health care provider who has been trained to score this screening test uses a guide to assess if your symptoms suggest that you may have depression. ? Hamilton Depression Rating Scale (HAM-D). This is a set of either 17 or 24 questions. You may be asked to take it again during or after your treatment, to see if your depression has gotten better. ? Beck Depression Inventory (BDI). This is a set of 21 multiple choice questions. Your health care provider scores your answers to assess:  Your level of depression, ranging from mild to severe.  Your response to treatment.  Your health care provider may talk with you about your daily activities, such as eating, sleeping, work, and recreation, and ask if you have had any changes in activity.  Your health care provider may ask you to see a mental health specialist, such as a psychiatrist or psychologist, for more evaluation. Who should be screened for depression?  All adults, including adults with a family history of a mental health disorder.  Adolescents who are 29-71 years old.  People who are recovering from a myocardial infarction (MI).  Pregnant women, or women who have given birth.  People who have a long-term (chronic) illness.  Anyone who has been diagnosed with another type of a mental health disorder.  Anyone who has symptoms that could show depression.   What do my results mean? Your health care provider will review the results of your depression screening, physical exam, and lab tests. Positive screens suggest that you may have depression. Screening is the first step in getting the care that you may need. It is  up to you to get your screening results. Ask your health care provider, or the department that is doing your screening tests, when your results will be ready. Talk with your health care provider about your results and diagnosis. A diagnosis of depression is made using the Diagnostic and Statistical Manual of Mental Disorders (DSM-V). This is a book that lists the number and type of symptoms that must be present for a health care provider to give a specific diagnosis.  Your health care provider may work with you to treat your symptoms of depression, or your health care provider may help you find a mental health provider who can assess, diagnose, and treat your depression. Get help right away if:  You have thoughts about hurting yourself or others. If you ever feel like you may hurt yourself or others, or have thoughts about taking your own life, get help right away. You can go to your nearest emergency department or call:  Your local emergency services (911 in the [place]  A suicide crisis helpline, such as the [place] at 435-332-4964. This is open 24 hours a day. Summary  Depression screening is the first step in getting the help that you may need.  If your screening test shows symptoms of depression (is positive), your health care provider may ask you to see a mental health provider.  Anyone who is age 103 or older should be screened for depression. This information is not intended to replace advice given to you by your health care provider. Make sure you discuss any questions you have with your  health care provider. Document Revised: 12/15/2019 Document Reviewed: 12/15/2019 Elsevier Patient Education  [place].

## 2020-08-10 NOTE — Assessment & Plan Note (Signed)
Patient reporting compliance with medication.  No new signs and symptoms of hypo or hyperglycemia.  Continue on a diabetic diet with exercise regimen as tolerated.  Rx refill sent to pharmacy. Advised patient to schedule an appointment for chronic disease management, physical, blood work and foot examination.  Patient verbalized understanding.

## 2020-08-13 ENCOUNTER — Encounter (INDEPENDENT_AMBULATORY_CARE_PROVIDER_SITE_OTHER): Payer: Self-pay | Admitting: Gastroenterology

## 2020-08-13 ENCOUNTER — Telehealth (INDEPENDENT_AMBULATORY_CARE_PROVIDER_SITE_OTHER): Payer: Medicare Other | Admitting: Gastroenterology

## 2020-08-13 ENCOUNTER — Other Ambulatory Visit: Payer: Self-pay

## 2020-08-13 DIAGNOSIS — K648 Other hemorrhoids: Secondary | ICD-10-CM

## 2020-08-13 DIAGNOSIS — R1310 Dysphagia, unspecified: Secondary | ICD-10-CM | POA: Insufficient documentation

## 2020-08-13 DIAGNOSIS — K649 Unspecified hemorrhoids: Secondary | ICD-10-CM | POA: Insufficient documentation

## 2020-08-13 DIAGNOSIS — K219 Gastro-esophageal reflux disease without esophagitis: Secondary | ICD-10-CM | POA: Diagnosis not present

## 2020-08-13 DIAGNOSIS — R1319 Other dysphagia: Secondary | ICD-10-CM

## 2020-08-13 MED ORDER — PANTOPRAZOLE SODIUM 40 MG PO TBEC: 40.0000 mg | DELAYED_RELEASE_TABLET | Freq: Two times a day (BID) | ORAL | 2 refills | Status: DC
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
## 2020-08-13 NOTE — Patient Instructions (Signed)
Schedule EGD with ED Continue pantoprazole 40 mg twice a day for now Referral to colorectal surgery for hemorrhoid banding
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
## 2020-08-14 ENCOUNTER — Inpatient Hospital Stay (HOSPITAL_COMMUNITY)
Admission: RE | Admit: 2020-08-14 | Discharge: 2020-08-14 | Disposition: A | Discharge status: Home / Self Care | Payer: Medicare Other | Source: Ambulatory Visit

## 2020-08-14 ENCOUNTER — Other Ambulatory Visit (INDEPENDENT_AMBULATORY_CARE_PROVIDER_SITE_OTHER): Payer: Self-pay

## 2020-08-14 ENCOUNTER — Encounter (INDEPENDENT_AMBULATORY_CARE_PROVIDER_SITE_OTHER): Payer: Self-pay

## 2020-08-14 NOTE — Progress Notes (Signed)
Called and spoke to pt.'s wife, she said he had a covid appointment for 2/14/at 2:30 instead of today. I explained to his wife that he is still on today's schedule for his covid test not 2/14. Dr.'s office rescheduled procedure not covid test. Asked pt.'s wife to call the Dr.'s office so they can change the appointment.

## 2020-08-15 ENCOUNTER — Encounter (HOSPITAL_COMMUNITY): Payer: Self-pay | Admitting: Gastroenterology

## 2020-08-20 ENCOUNTER — Other Ambulatory Visit (HOSPITAL_COMMUNITY)
Admission: RE | Admit: 2020-08-20 | Discharge: 2020-08-20 | Disposition: A | Discharge status: Home / Self Care | Payer: Medicare Other | Source: Ambulatory Visit | Attending: Gastroenterology | Admitting: Gastroenterology

## 2020-08-20 ENCOUNTER — Other Ambulatory Visit: Payer: Self-pay

## 2020-08-20 DIAGNOSIS — Z01812 Encounter for preprocedural laboratory examination: Secondary | ICD-10-CM | POA: Diagnosis not present

## 2020-08-20 DIAGNOSIS — Z20822 Contact with and (suspected) exposure to covid-19: Secondary | ICD-10-CM | POA: Insufficient documentation

## 2020-08-21 ENCOUNTER — Ambulatory Visit (INDEPENDENT_AMBULATORY_CARE_PROVIDER_SITE_OTHER): Payer: Medicare Other | Admitting: *Deleted

## 2020-08-21 VITALS — BP 154/94 | HR 93 | Ht 68.0 in | Wt 239.0 lb

## 2020-08-21 DIAGNOSIS — Z0000 Encounter for general adult medical examination without abnormal findings: Secondary | ICD-10-CM | POA: Diagnosis not present

## 2020-08-21 LAB — SARS CORONAVIRUS 2 (TAT 6-24 HRS): SARS Coronavirus 2: NEGATIVE

## 2020-08-21 NOTE — Patient Instructions (Signed)
  Preston Berry , Thank you for taking time to come for your Medicare Wellness Visit. I appreciate your ongoing commitment to your health goals. Please review the following plan we discussed and let me know if I can assist you in the future.   These are the goals we discussed: Goals    . DIET - EAT MORE FRUITS AND VEGETABLES    . Exercise 150 min/wk Moderate Activity       This is a list of the screening recommended for you and due dates:  Health Maintenance  Topic Date Due  . Pneumococcal vaccine  Never done  . Complete foot exam   Never done  . Hemoglobin A1C  08/17/2020  . COVID-19 Vaccine (1) 09/06/2020*  . Flu Shot  10/04/2020*  . Eye exam for diabetics  10/04/2020*  . Tetanus Vaccine  10/16/2023  .  Hepatitis C: One time screening is recommended by Center for Disease Control  (CDC) for  adults born from [birth year] through 1965.   Completed  . HIV Screening  Completed  *Topic was postponed. The date shown is not the original due date.
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
## 2020-08-22 ENCOUNTER — Ambulatory Visit (HOSPITAL_COMMUNITY): Payer: Medicare Other | Admitting: Anesthesiology

## 2020-08-22 ENCOUNTER — Other Ambulatory Visit: Payer: Self-pay

## 2020-08-22 ENCOUNTER — Encounter (HOSPITAL_COMMUNITY)
Admission: RE | Disposition: A | Discharge status: Home / Self Care | Payer: Self-pay | Source: Home / Self Care | Attending: Gastroenterology

## 2020-08-22 ENCOUNTER — Ambulatory Visit (HOSPITAL_COMMUNITY)
Admission: RE | Admit: 2020-08-22 | Discharge: 2020-08-22 | Disposition: A | Discharge status: Home / Self Care | Payer: Medicare Other | Attending: Gastroenterology | Admitting: Gastroenterology

## 2020-08-22 ENCOUNTER — Encounter (HOSPITAL_COMMUNITY): Payer: Self-pay | Admitting: Gastroenterology

## 2020-08-22 DIAGNOSIS — R1310 Dysphagia, unspecified: Secondary | ICD-10-CM | POA: Diagnosis not present

## 2020-08-22 DIAGNOSIS — Z7984 Long term (current) use of oral hypoglycemic drugs: Secondary | ICD-10-CM | POA: Diagnosis not present

## 2020-08-22 DIAGNOSIS — Z803 Family history of malignant neoplasm of breast: Secondary | ICD-10-CM | POA: Insufficient documentation

## 2020-08-22 DIAGNOSIS — Z8041 Family history of malignant neoplasm of ovary: Secondary | ICD-10-CM | POA: Diagnosis not present

## 2020-08-22 DIAGNOSIS — Q398 Other congenital malformations of esophagus: Secondary | ICD-10-CM | POA: Insufficient documentation

## 2020-08-22 DIAGNOSIS — Z86010 Personal history of colonic polyps: Secondary | ICD-10-CM | POA: Insufficient documentation

## 2020-08-22 DIAGNOSIS — K219 Gastro-esophageal reflux disease without esophagitis: Secondary | ICD-10-CM | POA: Insufficient documentation

## 2020-08-22 DIAGNOSIS — Z833 Family history of diabetes mellitus: Secondary | ICD-10-CM | POA: Diagnosis not present

## 2020-08-22 DIAGNOSIS — Z8249 Family history of ischemic heart disease and other diseases of the circulatory system: Secondary | ICD-10-CM | POA: Insufficient documentation

## 2020-08-22 DIAGNOSIS — R12 Heartburn: Secondary | ICD-10-CM

## 2020-08-22 DIAGNOSIS — Z881 Allergy status to other antibiotic agents status: Secondary | ICD-10-CM | POA: Diagnosis not present

## 2020-08-22 DIAGNOSIS — K649 Unspecified hemorrhoids: Secondary | ICD-10-CM | POA: Diagnosis not present

## 2020-08-22 DIAGNOSIS — Z809 Family history of malignant neoplasm, unspecified: Secondary | ICD-10-CM | POA: Diagnosis not present

## 2020-08-22 DIAGNOSIS — K760 Fatty (change of) liver, not elsewhere classified: Secondary | ICD-10-CM | POA: Diagnosis not present

## 2020-08-22 DIAGNOSIS — Z800 Family history of malignant neoplasm of digestive organs: Secondary | ICD-10-CM | POA: Diagnosis not present

## 2020-08-22 DIAGNOSIS — K449 Diaphragmatic hernia without obstruction or gangrene: Secondary | ICD-10-CM

## 2020-08-22 DIAGNOSIS — R319 Hematuria, unspecified: Secondary | ICD-10-CM | POA: Diagnosis not present

## 2020-08-22 DIAGNOSIS — Z888 Allergy status to other drugs, medicaments and biological substances status: Secondary | ICD-10-CM | POA: Insufficient documentation

## 2020-08-22 DIAGNOSIS — Z880 Allergy status to penicillin: Secondary | ICD-10-CM | POA: Diagnosis not present

## 2020-08-22 DIAGNOSIS — Z791 Long term (current) use of non-steroidal anti-inflammatories (NSAID): Secondary | ICD-10-CM | POA: Diagnosis not present

## 2020-08-22 DIAGNOSIS — K2289 Other specified disease of esophagus: Secondary | ICD-10-CM | POA: Diagnosis not present

## 2020-08-22 DIAGNOSIS — F319 Bipolar disorder, unspecified: Secondary | ICD-10-CM | POA: Insufficient documentation

## 2020-08-22 DIAGNOSIS — Z79899 Other long term (current) drug therapy: Secondary | ICD-10-CM | POA: Insufficient documentation

## 2020-08-22 HISTORY — DX: Type 2 diabetes mellitus without complications: E11.9

## 2020-08-22 HISTORY — PX: ESOPHAGEAL DILATION: SHX303

## 2020-08-22 HISTORY — PX: ESOPHAGOGASTRODUODENOSCOPY (EGD) WITH PROPOFOL: SHX5813

## 2020-08-22 LAB — GLUCOSE, CAPILLARY: Glucose-Capillary: 119 mg/dL — ABNORMAL HIGH (ref 70–99)

## 2020-08-22 SURGERY — ESOPHAGOGASTRODUODENOSCOPY (EGD) WITH PROPOFOL
Anesthesia: General

## 2020-08-22 MED ORDER — LACTATED RINGERS IV SOLN
INTRAVENOUS | Status: DC
  Administered 2020-08-22: 1000 mL via INTRAVENOUS

## 2020-08-22 MED ORDER — PROPOFOL 500 MG/50ML IV EMUL
INTRAVENOUS | Status: DC | PRN
  Administered 2020-08-22: 200 ug/kg/min via INTRAVENOUS

## 2020-08-22 NOTE — Anesthesia Postprocedure Evaluation (Signed)
Anesthesia Post Note  Patient: Preston Berry  Procedure(s) Performed: ESOPHAGOGASTRODUODENOSCOPY (EGD) WITH PROPOFOL (N/A ) ESOPHAGEAL DILATION (N/A )  Patient location during evaluation: Phase II Anesthesia Type: General Level of consciousness: awake, oriented, awake and alert and patient cooperative Pain management: satisfactory to patient Vital Signs Assessment: post-procedure vital signs reviewed and stable Respiratory status: spontaneous breathing, respiratory function stable and nonlabored ventilation Cardiovascular status: stable Postop Assessment: no apparent nausea or vomiting Anesthetic complications: no   No complications documented.   Last Vitals:  Vitals:   08/22/20 0926 08/22/20 1011  BP: 122/75 (!) 86/48  Pulse: 67   Resp: 17 14  Temp: 37 C 36.8 C  SpO2: 97% 95%    Last Pain:  Vitals:   08/22/20 1011  TempSrc: Oral  PainSc: 0-No pain                 Girtie Wiersma

## 2020-08-22 NOTE — Discharge Instructions (Signed)
You are being discharged to home.  Resume your previous diet.  Continue your present medications, including pantoprazole 40 mg once a day.  Will refer for esophageal manometry.   Upper Endoscopy, Adult, Care After This sheet gives you information about how to care for yourself after your procedure. Your health care provider may also give you more specific instructions. If you have problems or questions, contact your health care provider. What can I expect after the procedure? After the procedure, it is common to have:  A sore throat.  Mild stomach pain or discomfort.  Bloating.  Nausea. Follow these instructions at home:  Follow instructions from your health care provider about what to eat or drink after your procedure.  Return to your normal activities as told by your health care provider. Ask your health care provider what activities are safe for you.  Take over-the-counter and prescription medicines only as told by your health care provider.  If you were given a sedative during the procedure, it can affect you for several hours. Do not drive or operate machinery until your health care provider says that it is safe.  Keep all follow-up visits as told by your health care provider. This is important.   Contact a health care provider if you have:  A sore throat that lasts longer than one day.  Trouble swallowing. Get help right away if:  You vomit blood or your vomit looks like coffee grounds.  You have: ? A fever. ? Bloody, black, or tarry stools. ? A severe sore throat or you cannot swallow. ? Difficulty breathing. ? Severe pain in your chest or abdomen. Summary  After the procedure, it is common to have a sore throat, mild stomach discomfort, bloating, and nausea.  If you were given a sedative during the procedure, it can affect you for several hours. Do not drive or operate machinery until your health care provider says that it is safe.  Follow instructions from  your health care provider about what to eat or drink after your procedure.  Return to your normal activities as told by your health care provider. This information is not intended to replace advice given to you by your health care provider. Make sure you discuss any questions you have with your health care provider. Document Revised: 06/21/2019 Document Reviewed: 11/23/2017 Elsevier Patient Education  [place].  Hiatal Hernia  A hiatal hernia occurs when part of the stomach slides above the muscle that separates the abdomen from the chest (diaphragm). A person can be born with a hiatal hernia (congenital), or it may develop over time. In almost all cases of hiatal hernia, only the top part of the stomach pushes through the diaphragm. Many people have a hiatal hernia with no symptoms. The larger the hernia, the more likely it is that you will have symptoms. In some cases, a hiatal hernia allows stomach acid to flow back into the tube that carries food from your mouth to your stomach (esophagus). This may cause heartburn symptoms. Severe heartburn symptoms may mean that you have developed a condition called gastroesophageal reflux disease (GERD). What are the causes? This condition is caused by a weakness in the opening (hiatus) where the esophagus passes through the diaphragm to attach to the upper part of the stomach. A person may be born with a weakness in the hiatus, or a weakness can develop over time. What increases the risk? This condition is more likely to develop in:  Older people. Age is a  major risk factor for a hiatal hernia, especially if you are over the age of 54.  Pregnant women.  People who are overweight.  People who have frequent constipation. What are the signs or symptoms? Symptoms of this condition usually develop in the form of GERD symptoms. Symptoms include:  Heartburn.  Belching.  Indigestion.  Trouble swallowing.  Coughing or wheezing.  Sore  throat.  Hoarseness.  Chest pain.  Nausea and vomiting. How is this diagnosed? This condition may be diagnosed during testing for GERD. Tests that may be done include:  X-rays of your stomach or chest.  An upper gastrointestinal (GI) series. This is an X-ray exam of your GI tract that is taken after you swallow a chalky liquid that shows up clearly on the X-ray.  Endoscopy. This is a procedure to look into your stomach using a thin, flexible tube that has a tiny camera and light on the end of it. How is this treated? This condition may be treated by:  Dietary and lifestyle changes to help reduce GERD symptoms.  Medicines. These may include: ? Over-the-counter antacids. ? Medicines that make your stomach empty more quickly. ? Medicines that block the production of stomach acid (H2 blockers). ? Stronger medicines to reduce stomach acid (proton pump inhibitors).  Surgery to repair the hernia, if other treatments are not helping. If you have no symptoms, you may not need treatment. Follow these instructions at home: Lifestyle and activity  Do not use any products that contain nicotine or tobacco, such as cigarettes and e-cigarettes. If you need help quitting, ask your health care provider.  Try to achieve and maintain a healthy body weight.  Avoid putting pressure on your abdomen. Anything that puts pressure on your abdomen increases the amount of acid that may be pushed up into your esophagus. ? Avoid bending over, especially after eating. ? Raise the head of your bed by putting blocks under the legs. This keeps your head and esophagus higher than your stomach. ? Do not wear tight clothing around your chest or stomach. ? Try not to strain when having a bowel movement, when urinating, or when lifting heavy objects. Eating and drinking  Avoid foods that can worsen GERD symptoms. These may include: ? Fatty foods, like fried foods. ? Citrus fruits, like oranges or lemon. ? Other  foods and drinks that contain acid, like orange juice or tomatoes. ? Spicy food. ? Chocolate.  Eat frequent small meals instead of three large meals a day. This helps prevent your stomach from getting too full. ? Eat slowly. ? Do not lie down right after eating. ? Do not eat 1-2 hours before bed.  Do not drink beverages with caffeine. These include cola, coffee, cocoa, and tea.  Do not drink alcohol. General instructions  Take over-the-counter and prescription medicines only as told by your health care provider.  Keep all follow-up visits as told by your health care provider. This is important. Contact a health care provider if:  Your symptoms are not controlled with medicines or lifestyle changes.  You are having trouble swallowing.  You have coughing or wheezing that will not go away. Get help right away if:  Your pain is getting worse.  Your pain spreads to your arms, neck, jaw, teeth, or back.  You have shortness of breath.  You sweat for no reason.  You feel sick to your stomach (nauseous) or you vomit.  You vomit blood.  You have bright red blood in your stools.  You have black, tarry stools. This information is not intended to replace advice given to you by your health care provider. Make sure you discuss any questions you have with your health care provider. Document Revised: 06/05/2017 Document Reviewed: 01/26/2017 Elsevier Patient Education  [place].

## 2020-08-22 NOTE — Transfer of Care (Signed)
Immediate Anesthesia Transfer of Care Note  Patient: Preston Berry  Procedure(s) Performed: ESOPHAGOGASTRODUODENOSCOPY (EGD) WITH PROPOFOL (N/A ) ESOPHAGEAL DILATION (N/A )  Patient Location: PACU  Anesthesia Type:General  Level of Consciousness: awake, alert , oriented and patient cooperative  Airway & Oxygen Therapy: Patient Spontanous Breathing  Post-op Assessment: Report given to RN, Post -op Vital signs reviewed and stable and Patient moving all extremities X 4  Post vital signs: Reviewed and stable  Last Vitals:  Vitals Value Taken Time  BP 86/48 08/22/20 1011  Temp 36.8 C 08/22/20 1011  Pulse    Resp 14 08/22/20 1011  SpO2 95 % 08/22/20 1011    Last Pain:  Vitals:   08/22/20 1011  TempSrc: Oral  PainSc: 0-No pain      Patients Stated Pain Goal: 9 (01/64/29 0379)  Complications: No complications documented.

## 2020-08-22 NOTE — Interval H&P Note (Signed)
History and Physical Interval Note:  08/22/2020 9:48 AM Preston Berry is a 35 y.o. male with past medical history of bipolar disorder, depression, fatty liver, GERD, hyperlipidemia, hypertension, who comes to the hospital for evaluation of dysphagia and heartburn.  Patient states that he has presented episodes of dysphagia when eating specific type of food for the last 2 months. States that he is still symptomatic, has no dysphagia to liquids. He has not lost any weight due to this. He also reports having intermittent episodes of heartburn when he skips his dose of Protonix. However, he reported that once he takes the medication he has adequate control of the disease and does not have any heartburn episodes in between.  BP 122/75   Pulse 67   Temp 98.6 F (37 C) (Oral)   Resp 17   Ht 5\' 8"  (1.727 m)   Wt 104.3 kg   SpO2 97%   BMI 34.97 kg/m  GENERAL: The patient is AO x3, in no acute distress. HEENT: Head is normocephalic and atraumatic. EOMI are intact. Mouth is well hydrated and without lesions. NECK: Supple. No masses LUNGS: Clear to auscultation. No presence of rhonchi/wheezing/rales. Adequate chest expansion HEART: RRR, normal s1 and s2. ABDOMEN: Soft, nontender, no guarding, no peritoneal signs, and nondistended. BS +. No masses. EXTREMITIES: Without any cyanosis, clubbing, rash, lesions or edema. NEUROLOGIC: AOx3, no focal motor deficit. SKIN: no jaundice, no rashes   [name]  has presented today for surgery, with the diagnosis of Esophageal Dysphagia.  The various methods of treatment have been discussed with the patient and family. After consideration of risks, benefits and other options for treatment, the patient has consented to  Procedure(s) with comments: ESOPHAGOGASTRODUODENOSCOPY (EGD) WITH PROPOFOL (N/A) - 10:30 ESOPHAGEAL DILATION (N/A) as a surgical intervention.  The patient's history has been reviewed, patient examined, no change in status, stable for  surgery.  I have reviewed the patient's chart and labs.  Questions were answered to the patient's satisfaction.     [name]

## 2020-08-22 NOTE — Anesthesia Preprocedure Evaluation (Signed)
Anesthesia Evaluation  Patient identified by MRN, date of birth, ID band Patient awake    Reviewed: Allergy & Precautions, NPO status , Patient's Chart, lab work & pertinent test results  History of Anesthesia Complications Negative for: history of anesthetic complications  Airway Mallampati: III  TM Distance: >3 FB Neck ROM: Full    Dental  (+) Dental Advisory Given, Teeth Intact   Pulmonary neg pulmonary ROS,    Pulmonary exam normal breath sounds clear to auscultation       Cardiovascular Exercise Tolerance: Good hypertension, Pt. on medications Normal cardiovascular exam Rhythm:Regular Rate:Normal     Neuro/Psych PSYCHIATRIC DISORDERS Anxiety Depression Bipolar Disorder  Neuromuscular disease    GI/Hepatic GERD  Medicated and Controlled,  Endo/Other  diabetes, Well Controlled, Type 2, Oral Hypoglycemic Agents  Renal/GU      Musculoskeletal  (+) Arthritis ,   Abdominal   Peds  Hematology   Anesthesia Other Findings   Reproductive/Obstetrics                             Anesthesia Physical Anesthesia Plan  ASA: II  Anesthesia Plan: General   Post-op Pain Management:    Induction: Intravenous  PONV Risk Score and Plan: TIVA  Airway Management Planned: Nasal Cannula and Natural Airway  Additional Equipment:   Intra-op Plan:   Post-operative Plan:   Informed Consent: I have reviewed the patients History and Physical, chart, labs and discussed the procedure including the risks, benefits and alternatives for the proposed anesthesia with the patient or authorized representative who has indicated his/her understanding and acceptance.     Dental advisory given  Plan Discussed with: CRNA and Surgeon  Anesthesia Plan Comments:         Anesthesia Quick Evaluation

## 2020-08-22 NOTE — Op Note (Signed)
[facility] Patient Name: Preston Berry Procedure Date: 08/22/2020 9:32 AM MRN: [id number] Date of Birth: 10-18-1985 Attending MD: Maylon Peppers ,  CSN: [id number] Age: 35 Admit Type: Outpatient Procedure:                Upper GI endoscopy Indications:              Dysphagia, Heartburn Providers:                Maylon Peppers, Gwenlyn Fudge, RN, Aram Candela Referring MD:              Medicines:                Monitored Anesthesia Care Complications:            No immediate complications. Estimated Blood Loss:     Estimated blood loss: none. Procedure:                Pre-Anesthesia Assessment:                           - Prior to the procedure, a History and Physical                            was performed, and patient medications, allergies                            and sensitivities were reviewed. The patient's                            tolerance of previous anesthesia was reviewed.                           - The risks and benefits of the procedure and the                            sedation options and risks were discussed with the                            patient. All questions were answered and informed                            consent was obtained.                           - ASA Grade Assessment: II - A patient with mild                            systemic disease.                           After obtaining informed consent, the endoscope was                            passed under direct vision. Throughout the                            procedure, the patient's blood pressure, pulse,  and                            oxygen saturations were monitored continuously. The                            [id number]-H190 ([id number]) scope was introduced through the                            mouth, and advanced to the second part of duodenum.                            The upper GI endoscopy was accomplished without                            difficulty. The patient tolerated the  procedure                            well. Scope In: 9:58:10 AM Scope Out: 10:06:47 AM Total Procedure Duration: 0 hours 8 minutes 37 seconds  Findings:      Inlet patch was found in the upper third of the esophagus.      No endoscopic abnormality was evident in the esophagus to explain the       patient's complaint of dysphagia. It was decided, however, to proceed       with dilation of the entire esophagus. A guidewire was placed and the       scope was withdrawn. Dilation was performed with a Savary dilator with       no resistance at 18 mm. No heme or mucosal disruption was noted upon       reinspection of the area.      A small sliding hiatal hernia was found.      The entire examined stomach was normal.      The examined duodenum was normal. Impression:               - Inlet patch.                           - No endoscopic esophageal abnormality to explain                            patient's dysphagia. Esophagus dilated. Dilated.                           - Small sliding hiatal hernia.                           - Normal stomach.                           - Normal examined duodenum.                           - No specimens collected. Moderate Sedation:      Per Anesthesia Care Recommendation:           - Discharge patient to home (ambulatory).                           -  Resume previous diet.                           - Continue present medications, including                            pantoprazole 40 mg once a day.                           - Will refer for esophageal manometry. Procedure Code(s):        --- Professional ---                           [phone], Esophagogastroduodenoscopy, flexible,                            transoral; with insertion of guide wire followed by                            passage of dilator(s) through esophagus over guide                            wire Diagnosis Code(s):        --- Professional ---                           K22.8, Other specified  diseases of esophagus                           R13.10, Dysphagia, unspecified                           K44.9, Diaphragmatic hernia without obstruction or                            gangrene                           R12, Heartburn CPT copyright 2019 [employer]. All rights reserved. The codes documented in this report are preliminary and upon coder review may  be revised to meet current compliance requirements. Maylon Peppers, MD Maylon Peppers,  08/22/2020 10:16:32 AM This report has been signed electronically. Number of Addenda: 0

## 2020-08-27 ENCOUNTER — Encounter (HOSPITAL_COMMUNITY): Payer: Self-pay | Admitting: Gastroenterology

## 2020-08-28 DIAGNOSIS — M069 Rheumatoid arthritis, unspecified: Secondary | ICD-10-CM | POA: Diagnosis not present

## 2020-08-28 DIAGNOSIS — Z832 Family history of diseases of the blood and blood-forming organs and certain disorders involving the immune mechanism: Secondary | ICD-10-CM | POA: Diagnosis not present

## 2020-08-28 DIAGNOSIS — Z8739 Personal history of other diseases of the musculoskeletal system and connective tissue: Secondary | ICD-10-CM | POA: Diagnosis not present

## 2020-08-28 DIAGNOSIS — Z840 Family history of diseases of the skin and subcutaneous tissue: Secondary | ICD-10-CM | POA: Diagnosis not present

## 2020-08-30 ENCOUNTER — Telehealth (INDEPENDENT_AMBULATORY_CARE_PROVIDER_SITE_OTHER): Payer: Self-pay | Admitting: Gastroenterology

## 2020-08-30 NOTE — Telephone Encounter (Signed)
Patients sgo called the office wanted to know who scheduled patient for an office visit in [place]-stated doesn't know who the doctor is or where to go - ph# [phone]

## 2020-08-30 NOTE — Telephone Encounter (Signed)
Spoke to patient's SGO and gave her apt information

## 2020-09-06 DIAGNOSIS — E119 Type 2 diabetes mellitus without complications: Secondary | ICD-10-CM | POA: Diagnosis not present

## 2020-09-12 ENCOUNTER — Ambulatory Visit: Payer: Medicare Other | Admitting: Family Medicine

## 2020-09-12 ENCOUNTER — Ambulatory Visit: Payer: Medicare Other

## 2020-09-14 ENCOUNTER — Other Ambulatory Visit: Payer: Self-pay | Admitting: Nurse Practitioner

## 2020-09-14 DIAGNOSIS — F339 Major depressive disorder, recurrent, unspecified: Secondary | ICD-10-CM

## 2020-09-14 DIAGNOSIS — F411 Generalized anxiety disorder: Secondary | ICD-10-CM

## 2020-09-17 MED ORDER — ESCITALOPRAM OXALATE 20 MG PO TABS: 20.0000 mg | ORAL_TABLET | Freq: Every day | ORAL | 0 refills | Status: DC

## 2020-10-01 ENCOUNTER — Telehealth: Payer: Self-pay

## 2020-10-01 NOTE — Telephone Encounter (Signed)
Patient aware and verbalizes understanding. 

## 2020-10-01 NOTE — Telephone Encounter (Signed)
Patient states that since he has been on metformin and glyxambi his BS has been dropping between 76-100 and he feels very tired when this happens.  Yesterday patient did not take either medication and his BS was 204 when he first woke up and 145 later in the lady.  Patient has not taken his BS today. Please advise how patient should take his medication.   Covering PCP- please advise

## 2020-10-01 NOTE — Telephone Encounter (Signed)
Those blood sugars are normal.  He should continue taking his medication.  He will feel sleepy/tired for several weeks and potentially even a couple of months after starting medication, as his body has become accustomed to having high blood sugars.  This should eventually level out as his body becomes accustomed to normalization of his blood sugar.  There is no evidence of hypoglycemia even at BG of 76.

## 2020-10-06 DIAGNOSIS — E119 Type 2 diabetes mellitus without complications: Secondary | ICD-10-CM | POA: Diagnosis not present

## 2020-10-11 ENCOUNTER — Ambulatory Visit (INDEPENDENT_AMBULATORY_CARE_PROVIDER_SITE_OTHER): Payer: Medicare Other | Admitting: Family Medicine

## 2020-10-11 ENCOUNTER — Ambulatory Visit: Payer: Medicare Other | Admitting: Family Medicine

## 2020-10-11 DIAGNOSIS — F411 Generalized anxiety disorder: Secondary | ICD-10-CM | POA: Diagnosis not present

## 2020-10-11 DIAGNOSIS — F339 Major depressive disorder, recurrent, unspecified: Secondary | ICD-10-CM | POA: Diagnosis not present

## 2020-10-11 DIAGNOSIS — E1165 Type 2 diabetes mellitus with hyperglycemia: Secondary | ICD-10-CM | POA: Diagnosis not present

## 2020-10-11 NOTE — Progress Notes (Signed)
Virtual Visit via Telephone Note  I connected with Preston Berry on 10/11/20 at 4:41 PM by telephone and verified that I am speaking with the correct person using two identifiers. Preston Berry is currently located at home and his fiance is currently with him during this visit. The provider, Loman Brooklyn, FNP is located in their office at time of visit.  I discussed the limitations, risks, security and privacy concerns of performing an evaluation and management service by telephone and the availability of in person appointments. I also discussed with the patient that there may be a patient responsible charge related to this service. The patient expressed understanding and agreed to proceed.  Subjective: PCP: Loman Brooklyn, FNP  Chief Complaint  Patient presents with  . Diabetes  . Depression   Diabetes: Patient presents for follow up of diabetes. Current symptoms include: hyperglycemia. Known diabetic complications: none. Medication compliance: yes. Current diet: in general, a "healthy" diet  . Current exercise: a little. Home blood sugar records: BGs range between 76 and 169. Is he  on ACE inhibitor or angiotensin II receptor blocker? Yes. Is he on a statin? No - intolerant.   Depression/Anxiety: patient does not feel he is well controlled. He has previously failed therapy with Celexa, Seroquel, Depakote, Dexedrine, and Prozac. He is currently taking buspirone 15 mg TID and Lexapro 20 mg QD.   Depression screen [facility] 2/9 10/11/2020 08/21/2020 08/10/2020  Decreased Interest 2 0 3  Down, Depressed, Hopeless _0 PHQ - 2 Score _1 Altered sleeping 3 - 3  Tired, decreased energy 3 - 3  Change in appetite 0 - 0  Feeling bad or failure about yourself  0 - 0  Trouble concentrating 3 - 0  Moving slowly or fidgety/restless 0 - 0  Suicidal thoughts 0 - 0  PHQ-9 Score 14 - 11  Difficult doing work/chores Somewhat difficult - Somewhat difficult  Some recent data might be hidden   GAD  7 : Generalized Anxiety Score 10/11/2020 04/06/2020 01/17/2020 11/17/2019  Nervous, Anxious, on Edge 0 _2 Control/stop worrying _3 Worry too much - different things _4 Trouble relaxing 0 _5 Restless 0 3 2 0  Easily annoyed or irritable _6 Afraid - awful might happen 0 0 0 0  Total GAD 7 Score _7 Anxiety Difficulty Somewhat difficult - Somewhat difficult -    ROS: Per HPI  Current Outpatient Medications:  .  aspirin EC 325 MG tablet, Take 325-650 mg by mouth every 8 (eight) hours as needed (pain)., Disp: , Rfl:  .  Blood Glucose Monitoring Suppl (ONETOUCH VERIO FLEX SYSTEM) w/Device KIT, Use to test blood sugar daily. DX:E11.9, Disp: 1 kit, Rfl: 1 .  busPIRone (BUSPAR) 15 MG tablet, Take 1 tablet (15 mg total) by mouth 3 (three) times daily., Disp: 90 tablet, Rfl: 6 .  Empagliflozin-linaGLIPtin (GLYXAMBI) 25-5 MG TABS, Take 1 tablet by mouth daily., Disp: 30 tablet, Rfl: 3 .  escitalopram (LEXAPRO) 20 MG tablet, Take 1 tablet (20 mg total) by mouth daily., Disp: 30 tablet, Rfl: 0 .  glucose blood test strip, Premier classic test strips. Use as instructed test twice a day Dx E11.9, Disp: 100 each, Rfl: 12 .  ibuprofen (ADVIL) 200 MG tablet, Take 400 mg by mouth every 8 (eight) hours as needed (pain)., Disp: , Rfl:  .  lisinopril (ZESTRIL) 10 MG tablet, Take 10 mg by mouth daily., Disp: , Rfl:  .  metFORMIN (GLUCOPHAGE-XR) 500 MG 24 hr tablet, Take 1 tablet (500 mg total) by mouth 2 (two) times daily with a meal., Disp: 60 tablet, Rfl: 2 .  OneTouch Delica Lancets 05L MISC, Use to test blood sugar  daily. DX:E11.9, Disp: 100 each, Rfl: 11 .  pantoprazole (PROTONIX) 40 MG tablet, Take 1 tablet (40 mg total) by mouth 2 (two) times daily. (Patient taking differently: Take 40 mg by mouth daily before breakfast.), Disp: 30 tablet, Rfl: 2  Allergies  Allergen Reactions  . Penicillins Anaphylaxis and Swelling  . Keflex [Cephalexin]     Patient states he is allergic  to cephlasporins. Unknown reaction  . Statins     Muscle pain/cramps   Past Medical History:  Diagnosis Date  . Abdominal pain 02/19/2015  . Abnormal EKG 01/27/2019  . Arthritis   . Bipolar disorder ([place]) 04/10/2014  . Chest pain, rule out acute myocardial infarction 12/28/2018  . Colitis   . Constipation 02/08/2019  . Depression   . Diabetes mellitus without complication ([place])   . Eczema 09/18/2015  . Essential hypertension 01/13/2019  . Fatty liver   . GERD (gastroesophageal reflux disease)   . Gynecomastia   . H/O lead exposure 02/19/2015  . Hepatic steatosis   . Hepatomegaly   . Hyperlipidemia   . Hypertension   . LLQ abdominal pain 02/08/2019  . Panic attacks 04/10/2014  . Rectal bleeding 02/08/2019  . Transaminitis 02/19/2015    Observations/Objective: A&O  No respiratory distress or wheezing audible over the phone Mood, judgement, and thought processes all WNL  Assessment and Plan: 1. Type 2 diabetes mellitus with hyperglycemia, without long-term current use of insulin (HCC) Lab Results  Component Value Date   HGBA1C 10.7 (H) 02/15/2020   HGBA1C 5.0 08/10/2017   HGBA1C 5.3 09/04/2016    A1c needed to determine control. I suspect A1c will be much lower based on home blood glucose readings. Patient will return for lab work when he is out of quarantine. - Medications: continue current medications - Home glucose monitoring: continue monitoring - Patient is not currently taking a statin as he is intolerant. Patient is taking an ACE-inhibitor/ARB.  - Instruction/counseling given: reminded to get eye exam  Diabetes Health Maintenance Due  Topic Date Due  . FOOT EXAM  Never done  . OPHTHALMOLOGY EXAM  Never done  . HEMOGLOBIN A1C  08/17/2020    Lab Results  Component Value Date   LABMICR See below: 02/15/2020   LABMICR See below: 07/29/2017   - CBC with Differential/Platelet; Future - CMP14+EGFR; Future - Lipid panel; Future - Bayer DCA Hb A1c Waived; Future  2-3.  Depression, recurrent (HCC)/GAD (generalized anxiety disorder) Uncontrolled. Advised to decrease lexapro to 10 mg daily x1 week, then 10 mg every other day x1 week, then stop. Advised he may go ahead and start Viibryd.  - Vilazodone HCl (VIIBRYD STARTER PACK) 10 & 20 MG KIT; Take 10 mg by mouth daily for 7 days, THEN 20 mg daily for 7 days.  Dispense: 1 kit; Refill: 0   Follow Up Instructions: Return in about 6 weeks (around 11/22/2020) for depression & anxiety.  I discussed the assessment and treatment plan with the patient. The patient was provided an opportunity to ask questions and all were answered. The patient agreed with the plan and demonstrated an understanding of the instructions.   The patient was advised to call back  or seek an in-person evaluation if the symptoms worsen or if the condition fails to improve as anticipated.  The above assessment and management plan was discussed with the patient. The patient verbalized understanding of and has agreed to the management plan. Patient is aware to call the clinic if symptoms persist or worsen. Patient is aware when to return to the clinic for a follow-up visit. Patient educated on when it is appropriate to go to the emergency department.   Time call ended: 5:02 PM  I provided 21 minutes of non-face-to-face time during this encounter.  Hendricks Limes, MSN, APRN, FNP-C [facility] 10/11/20

## 2020-10-14 ENCOUNTER — Encounter: Payer: Self-pay | Admitting: Family Medicine

## 2020-10-14 MED ORDER — VIIBRYD STARTER PACK 10 & 20 MG PO KIT: PACK | ORAL | 0 refills | Status: DC

## 2020-10-22 ENCOUNTER — Telehealth: Payer: Self-pay

## 2020-10-22 DIAGNOSIS — F411 Generalized anxiety disorder: Secondary | ICD-10-CM

## 2020-10-22 MED ORDER — BUSPIRONE HCL 15 MG PO TABS: 15.0000 mg | ORAL_TABLET | Freq: Three times a day (TID) | ORAL | 2 refills | Status: AC

## 2020-10-22 MED ORDER — LISINOPRIL 10 MG PO TABS
10.0000 mg | ORAL_TABLET | Freq: Every day | ORAL | 5 refills | Status: AC
Start: 2020-10-22 — End: ?

## 2020-10-22 NOTE — Telephone Encounter (Signed)
  Prescription Request  10/22/2020  What is the name of the medication or equipment? Busabar & Lisinopril  Have you contacted your pharmacy to request a refill? (if applicable) no  Which pharmacy would you like this sent to? [facility]   Patient notified that their request is being sent to the clinical staff for review and that they should receive a response within 2 business days.   Joyce's pt.  Please call pt.

## 2020-10-25 ENCOUNTER — Telehealth: Payer: Self-pay

## 2020-10-25 ENCOUNTER — Other Ambulatory Visit: Payer: Self-pay

## 2020-10-25 DIAGNOSIS — K219 Gastro-esophageal reflux disease without esophagitis: Secondary | ICD-10-CM

## 2020-10-25 MED ORDER — PANTOPRAZOLE SODIUM 40 MG PO TBEC: 40.0000 mg | DELAYED_RELEASE_TABLET | Freq: Two times a day (BID) | ORAL | 5 refills | Status: DC

## 2020-10-25 MED ORDER — PANTOPRAZOLE SODIUM 40 MG PO TBEC: 40.0000 mg | DELAYED_RELEASE_TABLET | Freq: Two times a day (BID) | ORAL | 5 refills | Status: AC

## 2020-10-25 NOTE — Telephone Encounter (Signed)
  Prescription Request  10/25/2020  What is the name of the medication or equipment? pantoprazole (PROTONIX) 40 MG tablet  Have you contacted your pharmacy to request a refill? (if applicable) no  Which pharmacy would you like this sent to? [facility]   Patient notified that their request is being sent to the clinical staff for review and that they should receive a response within 2 business days.

## 2020-10-25 NOTE — Telephone Encounter (Signed)
Aware medication being sent to pharmacy

## 2020-11-05 DIAGNOSIS — E119 Type 2 diabetes mellitus without complications: Secondary | ICD-10-CM | POA: Diagnosis not present

## 2020-11-14 ENCOUNTER — Other Ambulatory Visit: Payer: Self-pay | Admitting: Family Medicine

## 2020-11-14 DIAGNOSIS — F339 Major depressive disorder, recurrent, unspecified: Secondary | ICD-10-CM

## 2020-11-14 DIAGNOSIS — F411 Generalized anxiety disorder: Secondary | ICD-10-CM

## 2020-12-05 ENCOUNTER — Telehealth: Payer: Self-pay | Admitting: Family Medicine

## 2020-12-05 DIAGNOSIS — E119 Type 2 diabetes mellitus without complications: Secondary | ICD-10-CM | POA: Diagnosis not present

## 2020-12-05 DIAGNOSIS — E1165 Type 2 diabetes mellitus with hyperglycemia: Secondary | ICD-10-CM

## 2020-12-05 NOTE — Telephone Encounter (Signed)
Labs entered.

## 2020-12-05 NOTE — Telephone Encounter (Signed)
Patient aware.

## 2020-12-06 DIAGNOSIS — E119 Type 2 diabetes mellitus without complications: Secondary | ICD-10-CM | POA: Diagnosis not present

## 2020-12-06 DIAGNOSIS — Z7984 Long term (current) use of oral hypoglycemic drugs: Secondary | ICD-10-CM | POA: Diagnosis not present

## 2020-12-07 DIAGNOSIS — E872 Acidosis: Secondary | ICD-10-CM | POA: Diagnosis not present

## 2020-12-07 DIAGNOSIS — R059 Cough, unspecified: Secondary | ICD-10-CM | POA: Diagnosis not present

## 2020-12-07 DIAGNOSIS — R1110 Vomiting, unspecified: Secondary | ICD-10-CM | POA: Diagnosis not present

## 2020-12-07 DIAGNOSIS — R61 Generalized hyperhidrosis: Secondary | ICD-10-CM | POA: Diagnosis not present

## 2020-12-07 DIAGNOSIS — I10 Essential (primary) hypertension: Secondary | ICD-10-CM | POA: Diagnosis not present

## 2020-12-07 DIAGNOSIS — N200 Calculus of kidney: Secondary | ICD-10-CM | POA: Diagnosis not present

## 2020-12-07 DIAGNOSIS — R112 Nausea with vomiting, unspecified: Secondary | ICD-10-CM | POA: Diagnosis not present

## 2020-12-07 DIAGNOSIS — E119 Type 2 diabetes mellitus without complications: Secondary | ICD-10-CM | POA: Diagnosis not present

## 2020-12-07 DIAGNOSIS — D72829 Elevated white blood cell count, unspecified: Secondary | ICD-10-CM | POA: Diagnosis not present

## 2020-12-07 DIAGNOSIS — R1084 Generalized abdominal pain: Secondary | ICD-10-CM | POA: Diagnosis not present

## 2020-12-07 DIAGNOSIS — K529 Noninfective gastroenteritis and colitis, unspecified: Secondary | ICD-10-CM | POA: Diagnosis not present

## 2020-12-07 DIAGNOSIS — R197 Diarrhea, unspecified: Secondary | ICD-10-CM | POA: Diagnosis not present

## 2020-12-07 DIAGNOSIS — R110 Nausea: Secondary | ICD-10-CM | POA: Diagnosis not present

## 2020-12-07 DIAGNOSIS — K3189 Other diseases of stomach and duodenum: Secondary | ICD-10-CM | POA: Diagnosis not present

## 2020-12-07 DIAGNOSIS — K760 Fatty (change of) liver, not elsewhere classified: Secondary | ICD-10-CM | POA: Diagnosis not present

## 2020-12-07 DIAGNOSIS — Z20822 Contact with and (suspected) exposure to covid-19: Secondary | ICD-10-CM | POA: Diagnosis not present

## 2020-12-13 ENCOUNTER — Ambulatory Visit: Payer: Medicare Other | Admitting: Nurse Practitioner

## 2020-12-13 ENCOUNTER — Telehealth: Payer: Self-pay | Admitting: Family Medicine

## 2020-12-13 NOTE — Telephone Encounter (Signed)
Patient states he needs to cancel his appointment for today.  States he is worse than he was and is going to the ER.

## 2020-12-13 NOTE — Telephone Encounter (Signed)
Error

## 2020-12-17 ENCOUNTER — Encounter: Payer: Self-pay | Admitting: Family Medicine

## 2020-12-19 ENCOUNTER — Other Ambulatory Visit: Payer: Self-pay

## 2020-12-19 ENCOUNTER — Encounter (HOSPITAL_COMMUNITY): Payer: Self-pay

## 2020-12-19 ENCOUNTER — Emergency Department (HOSPITAL_COMMUNITY): Payer: Medicare Other

## 2020-12-19 ENCOUNTER — Emergency Department (HOSPITAL_COMMUNITY)
Admission: EM | Admit: 2020-12-19 | Discharge: 2020-12-20 | Disposition: A | Discharge status: Home / Self Care | Payer: Medicare Other | Attending: Emergency Medicine | Admitting: Emergency Medicine

## 2020-12-19 DIAGNOSIS — Z87891 Personal history of nicotine dependence: Secondary | ICD-10-CM | POA: Insufficient documentation

## 2020-12-19 DIAGNOSIS — I10 Essential (primary) hypertension: Secondary | ICD-10-CM | POA: Insufficient documentation

## 2020-12-19 DIAGNOSIS — E785 Hyperlipidemia, unspecified: Secondary | ICD-10-CM | POA: Diagnosis not present

## 2020-12-19 DIAGNOSIS — R072 Precordial pain: Secondary | ICD-10-CM | POA: Insufficient documentation

## 2020-12-19 DIAGNOSIS — R0602 Shortness of breath: Secondary | ICD-10-CM | POA: Insufficient documentation

## 2020-12-19 DIAGNOSIS — E1169 Type 2 diabetes mellitus with other specified complication: Secondary | ICD-10-CM | POA: Diagnosis not present

## 2020-12-19 DIAGNOSIS — R457 State of emotional shock and stress, unspecified: Secondary | ICD-10-CM | POA: Diagnosis not present

## 2020-12-19 DIAGNOSIS — R110 Nausea: Secondary | ICD-10-CM | POA: Insufficient documentation

## 2020-12-19 DIAGNOSIS — R52 Pain, unspecified: Secondary | ICD-10-CM | POA: Diagnosis not present

## 2020-12-19 DIAGNOSIS — R0789 Other chest pain: Secondary | ICD-10-CM | POA: Diagnosis not present

## 2020-12-19 DIAGNOSIS — J9811 Atelectasis: Secondary | ICD-10-CM | POA: Diagnosis not present

## 2020-12-19 DIAGNOSIS — R0689 Other abnormalities of breathing: Secondary | ICD-10-CM | POA: Diagnosis not present

## 2020-12-19 DIAGNOSIS — R079 Chest pain, unspecified: Secondary | ICD-10-CM | POA: Diagnosis not present

## 2020-12-19 LAB — CBC WITH DIFFERENTIAL/PLATELET
Abs Immature Granulocytes: 0.03 10*3/uL (ref 0.00–0.07)
Basophils Absolute: 0.1 10*3/uL (ref 0.0–0.1)
Basophils Relative: 1 %
Eosinophils Absolute: 0.1 10*3/uL (ref 0.0–0.5)
Eosinophils Relative: 2 %
HCT: 45 % (ref 39.0–52.0)
Hemoglobin: 14.8 g/dL (ref 13.0–17.0)
Immature Granulocytes: 0 %
Lymphocytes Relative: 34 %
Lymphs Abs: 2.7 10*3/uL (ref 0.7–4.0)
MCH: 29.2 pg (ref 26.0–34.0)
MCHC: 32.9 g/dL (ref 30.0–36.0)
MCV: 88.8 fL (ref 80.0–100.0)
Monocytes Absolute: 1 10*3/uL (ref 0.1–1.0)
Monocytes Relative: 13 %
Neutro Abs: 3.9 10*3/uL (ref 1.7–7.7)
Neutrophils Relative %: 50 %
Platelets: 356 10*3/uL (ref 150–400)
RBC: 5.07 MIL/uL (ref 4.22–5.81)
RDW: 12.5 % (ref 11.5–15.5)
WBC: 7.9 10*3/uL (ref 4.0–10.5)
nRBC: 0 % (ref 0.0–0.2)

## 2020-12-19 LAB — COMPREHENSIVE METABOLIC PANEL
ALT: 46 U/L — ABNORMAL HIGH (ref 0–44)
AST: 23 U/L (ref 15–41)
Albumin: 3.8 g/dL (ref 3.5–5.0)
Alkaline Phosphatase: 60 U/L (ref 38–126)
Anion gap: 7 (ref 5–15)
BUN: 14 mg/dL (ref 6–20)
CO2: 24 mmol/L (ref 22–32)
Calcium: 8.5 mg/dL — ABNORMAL LOW (ref 8.9–10.3)
Chloride: 102 mmol/L (ref 98–111)
Creatinine, Ser: 0.79 mg/dL (ref 0.61–1.24)
GFR, Estimated: >60 mL/min (ref >60)
Glucose, Bld: 155 mg/dL — ABNORMAL HIGH (ref 70–99)
Potassium: 3.9 mmol/L (ref 3.5–5.1)
Sodium: 133 mmol/L — ABNORMAL LOW (ref 135–145)
Total Bilirubin: 0.2 mg/dL — ABNORMAL LOW (ref 0.3–1.2)
Total Protein: 7 g/dL (ref 6.5–8.1)

## 2020-12-19 LAB — TROPONIN I (HIGH SENSITIVITY): Troponin I (High Sensitivity): 3 ng/L (ref <18)

## 2020-12-19 LAB — D-DIMER, QUANTITATIVE: D-Dimer, Quant: 0.41 ug/mL-FEU (ref 0.00–0.50)

## 2020-12-19 LAB — BRAIN NATRIURETIC PEPTIDE: B Natriuretic Peptide: 11 pg/mL (ref 0.0–100.0)

## 2020-12-19 MED ORDER — ASPIRIN 325 MG PO TABS
325.0000 mg | ORAL_TABLET | Freq: Once | ORAL | Status: AC
  Administered 2020-12-19: 325 mg via ORAL
  Filled 2020-12-19: qty 1

## 2020-12-19 MED ORDER — ONDANSETRON HCL 4 MG/2ML IJ SOLN
4.0000 mg | Freq: Once | INTRAMUSCULAR | Status: AC
  Administered 2020-12-19: 4 mg via INTRAVENOUS
  Filled 2020-12-19: qty 2

## 2020-12-19 NOTE — ED Triage Notes (Signed)
Pt to er, pt states that he started having chest pain yesterday morning.  States that he was awakened from sleep with his pain, pt states that yesterday he had vomiting and diarrhea.  States that nothing seems to make his chest pain better or worse.

## 2020-12-19 NOTE — Discharge Instructions (Signed)
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
## 2020-12-20 ENCOUNTER — Other Ambulatory Visit: Payer: Self-pay | Admitting: *Deleted

## 2020-12-20 DIAGNOSIS — R072 Precordial pain: Secondary | ICD-10-CM | POA: Diagnosis not present

## 2020-12-20 DIAGNOSIS — E1165 Type 2 diabetes mellitus with hyperglycemia: Secondary | ICD-10-CM

## 2020-12-20 LAB — TROPONIN I (HIGH SENSITIVITY): Troponin I (High Sensitivity): 3 ng/L (ref <18)

## 2020-12-20 MED ORDER — GLYXAMBI 25-5 MG PO TABS: 1.0000 | ORAL_TABLET | Freq: Every day | ORAL | 3 refills | Status: AC

## 2020-12-20 NOTE — ED Provider Notes (Signed)
1:51 AM Assumed care from Dr. Laverta Baltimore, please see their note for full history, physical and decision making until this point. In brief this is a 35 y.o. year old male who presented to the ED tonight with Chest Pain     H/o nonspecific cp, last stress 5 years ago. These symptoms are quite atypical for cardiac cause, pending second troponin. Ecg ok.   Second trop flat. Cp free. Will refer to pcp  Discharge instructions, including strict return precautions for new or worsening symptoms, given. Patient and/or family verbalized understanding and agreement with the plan as described.   Labs, studies and imaging reviewed by myself and considered in medical decision making if ordered. Imaging interpreted by radiology.  Labs Reviewed  COMPREHENSIVE METABOLIC PANEL - Abnormal; Notable for the following components:      Result Value   Sodium 133 (*)    Glucose, Bld 155 (*)    Calcium 8.5 (*)    ALT 46 (*)    Total Bilirubin 0.2 (*)    All other components within normal limits  BRAIN NATRIURETIC PEPTIDE  CBC WITH DIFFERENTIAL/PLATELET  D-DIMER, QUANTITATIVE  TROPONIN I (HIGH SENSITIVITY)  TROPONIN I (HIGH SENSITIVITY)    DG Chest Portable 1 View  Final Result      No follow-ups on file.     Audrielle Vankuren, Corene Cornea, MD 12/20/20 [phone]

## 2020-12-20 NOTE — ED Notes (Signed)
Ice chips provided. Pt resting comfortably in bed with visitor at bedside. Remains on cardiac monitoring. 2nd trop has been obtained. Bed locked and low. Call bell within reach. Will continue to monitor.

## 2021-01-04 DIAGNOSIS — E119 Type 2 diabetes mellitus without complications: Secondary | ICD-10-CM | POA: Diagnosis not present

## 2021-01-13 DIAGNOSIS — Z20822 Contact with and (suspected) exposure to covid-19: Secondary | ICD-10-CM | POA: Diagnosis not present

## 2021-01-14 DIAGNOSIS — F339 Major depressive disorder, recurrent, unspecified: Secondary | ICD-10-CM | POA: Diagnosis not present

## 2021-01-14 DIAGNOSIS — F909 Attention-deficit hyperactivity disorder, unspecified type: Secondary | ICD-10-CM | POA: Diagnosis not present

## 2021-01-14 DIAGNOSIS — F3181 Bipolar II disorder: Secondary | ICD-10-CM | POA: Diagnosis not present

## 2021-01-14 DIAGNOSIS — F419 Anxiety disorder, unspecified: Secondary | ICD-10-CM | POA: Diagnosis not present

## 2021-01-29 DIAGNOSIS — F411 Generalized anxiety disorder: Secondary | ICD-10-CM | POA: Diagnosis not present

## 2021-01-29 DIAGNOSIS — I10 Essential (primary) hypertension: Secondary | ICD-10-CM | POA: Diagnosis not present

## 2021-01-29 DIAGNOSIS — E119 Type 2 diabetes mellitus without complications: Secondary | ICD-10-CM | POA: Diagnosis not present

## 2021-01-29 DIAGNOSIS — K219 Gastro-esophageal reflux disease without esophagitis: Secondary | ICD-10-CM | POA: Diagnosis not present

## 2021-01-29 DIAGNOSIS — E785 Hyperlipidemia, unspecified: Secondary | ICD-10-CM | POA: Diagnosis not present

## 2021-01-29 DIAGNOSIS — Z79899 Other long term (current) drug therapy: Secondary | ICD-10-CM | POA: Diagnosis not present

## 2021-01-29 DIAGNOSIS — T7411XA Adult physical abuse, confirmed, initial encounter: Secondary | ICD-10-CM | POA: Diagnosis not present

## 2021-01-29 DIAGNOSIS — F329 Major depressive disorder, single episode, unspecified: Secondary | ICD-10-CM | POA: Diagnosis not present

## 2021-01-29 DIAGNOSIS — F319 Bipolar disorder, unspecified: Secondary | ICD-10-CM | POA: Diagnosis not present

## 2021-01-29 DIAGNOSIS — J45909 Unspecified asthma, uncomplicated: Secondary | ICD-10-CM | POA: Diagnosis not present

## 2021-01-29 DIAGNOSIS — Z7689 Persons encountering health services in other specified circumstances: Secondary | ICD-10-CM | POA: Diagnosis not present

## 2021-01-29 DIAGNOSIS — E669 Obesity, unspecified: Secondary | ICD-10-CM | POA: Diagnosis not present

## 2021-02-04 DIAGNOSIS — E119 Type 2 diabetes mellitus without complications: Secondary | ICD-10-CM | POA: Diagnosis not present

## 2021-02-11 DIAGNOSIS — F419 Anxiety disorder, unspecified: Secondary | ICD-10-CM | POA: Diagnosis not present

## 2021-02-11 DIAGNOSIS — F329 Major depressive disorder, single episode, unspecified: Secondary | ICD-10-CM | POA: Diagnosis not present

## 2021-03-07 DIAGNOSIS — E119 Type 2 diabetes mellitus without complications: Secondary | ICD-10-CM | POA: Diagnosis not present

## 2021-04-06 DIAGNOSIS — E119 Type 2 diabetes mellitus without complications: Secondary | ICD-10-CM | POA: Diagnosis not present

## 2021-04-18 DIAGNOSIS — E669 Obesity, unspecified: Secondary | ICD-10-CM | POA: Diagnosis not present

## 2021-04-18 DIAGNOSIS — R109 Unspecified abdominal pain: Secondary | ICD-10-CM | POA: Diagnosis not present

## 2021-04-18 DIAGNOSIS — K219 Gastro-esophageal reflux disease without esophagitis: Secondary | ICD-10-CM | POA: Diagnosis not present

## 2021-04-18 DIAGNOSIS — F319 Bipolar disorder, unspecified: Secondary | ICD-10-CM | POA: Diagnosis not present

## 2021-04-18 DIAGNOSIS — K5792 Diverticulitis of intestine, part unspecified, without perforation or abscess without bleeding: Secondary | ICD-10-CM | POA: Diagnosis not present

## 2021-04-18 DIAGNOSIS — F411 Generalized anxiety disorder: Secondary | ICD-10-CM | POA: Diagnosis not present

## 2021-04-18 DIAGNOSIS — E785 Hyperlipidemia, unspecified: Secondary | ICD-10-CM | POA: Diagnosis not present

## 2021-04-18 DIAGNOSIS — F329 Major depressive disorder, single episode, unspecified: Secondary | ICD-10-CM | POA: Diagnosis not present

## 2021-04-18 DIAGNOSIS — I10 Essential (primary) hypertension: Secondary | ICD-10-CM | POA: Diagnosis not present

## 2021-04-18 DIAGNOSIS — Z79899 Other long term (current) drug therapy: Secondary | ICD-10-CM | POA: Diagnosis not present

## 2021-04-18 DIAGNOSIS — E119 Type 2 diabetes mellitus without complications: Secondary | ICD-10-CM | POA: Diagnosis not present

## 2021-04-18 DIAGNOSIS — J45909 Unspecified asthma, uncomplicated: Secondary | ICD-10-CM | POA: Diagnosis not present

## 2021-05-07 DIAGNOSIS — E119 Type 2 diabetes mellitus without complications: Secondary | ICD-10-CM | POA: Diagnosis not present

## 2021-05-20 DIAGNOSIS — F411 Generalized anxiety disorder: Secondary | ICD-10-CM | POA: Diagnosis not present

## 2021-05-20 DIAGNOSIS — E669 Obesity, unspecified: Secondary | ICD-10-CM | POA: Diagnosis not present

## 2021-05-20 DIAGNOSIS — Z79899 Other long term (current) drug therapy: Secondary | ICD-10-CM | POA: Diagnosis not present

## 2021-05-20 DIAGNOSIS — L302 Cutaneous autosensitization: Secondary | ICD-10-CM | POA: Diagnosis not present

## 2021-05-20 DIAGNOSIS — J45909 Unspecified asthma, uncomplicated: Secondary | ICD-10-CM | POA: Diagnosis not present

## 2021-05-20 DIAGNOSIS — I10 Essential (primary) hypertension: Secondary | ICD-10-CM | POA: Diagnosis not present

## 2021-05-20 DIAGNOSIS — F329 Major depressive disorder, single episode, unspecified: Secondary | ICD-10-CM | POA: Diagnosis not present

## 2021-05-20 DIAGNOSIS — E119 Type 2 diabetes mellitus without complications: Secondary | ICD-10-CM | POA: Diagnosis not present

## 2021-05-20 DIAGNOSIS — F319 Bipolar disorder, unspecified: Secondary | ICD-10-CM | POA: Diagnosis not present

## 2021-05-20 DIAGNOSIS — E785 Hyperlipidemia, unspecified: Secondary | ICD-10-CM | POA: Diagnosis not present

## 2021-05-20 DIAGNOSIS — T7411XD Adult physical abuse, confirmed, subsequent encounter: Secondary | ICD-10-CM | POA: Diagnosis not present

## 2021-05-20 DIAGNOSIS — R109 Unspecified abdominal pain: Secondary | ICD-10-CM | POA: Diagnosis not present

## 2021-05-20 DIAGNOSIS — K5792 Diverticulitis of intestine, part unspecified, without perforation or abscess without bleeding: Secondary | ICD-10-CM | POA: Diagnosis not present

## 2021-05-20 DIAGNOSIS — B353 Tinea pedis: Secondary | ICD-10-CM | POA: Diagnosis not present

## 2021-05-20 DIAGNOSIS — K219 Gastro-esophageal reflux disease without esophagitis: Secondary | ICD-10-CM | POA: Diagnosis not present

## 2021-05-25 DIAGNOSIS — I10 Essential (primary) hypertension: Secondary | ICD-10-CM | POA: Diagnosis not present

## 2021-05-25 DIAGNOSIS — E119 Type 2 diabetes mellitus without complications: Secondary | ICD-10-CM | POA: Diagnosis not present

## 2021-05-25 DIAGNOSIS — J189 Pneumonia, unspecified organism: Secondary | ICD-10-CM | POA: Diagnosis not present

## 2021-05-25 DIAGNOSIS — B349 Viral infection, unspecified: Secondary | ICD-10-CM | POA: Diagnosis not present

## 2021-05-25 DIAGNOSIS — E7800 Pure hypercholesterolemia, unspecified: Secondary | ICD-10-CM | POA: Diagnosis not present

## 2021-05-25 DIAGNOSIS — Z20822 Contact with and (suspected) exposure to covid-19: Secondary | ICD-10-CM | POA: Diagnosis not present

## 2021-05-25 DIAGNOSIS — Z720 Tobacco use: Secondary | ICD-10-CM | POA: Diagnosis not present

## 2021-05-27 DIAGNOSIS — J40 Bronchitis, not specified as acute or chronic: Secondary | ICD-10-CM | POA: Diagnosis not present

## 2021-05-27 DIAGNOSIS — J069 Acute upper respiratory infection, unspecified: Secondary | ICD-10-CM | POA: Diagnosis not present

## 2021-05-30 DIAGNOSIS — R5383 Other fatigue: Secondary | ICD-10-CM | POA: Diagnosis not present

## 2021-05-30 DIAGNOSIS — Z880 Allergy status to penicillin: Secondary | ICD-10-CM | POA: Diagnosis not present

## 2021-05-30 DIAGNOSIS — R042 Hemoptysis: Secondary | ICD-10-CM | POA: Diagnosis not present

## 2021-05-30 DIAGNOSIS — R1032 Left lower quadrant pain: Secondary | ICD-10-CM | POA: Diagnosis not present

## 2021-05-30 DIAGNOSIS — R112 Nausea with vomiting, unspecified: Secondary | ICD-10-CM | POA: Diagnosis not present

## 2021-05-30 DIAGNOSIS — R109 Unspecified abdominal pain: Secondary | ICD-10-CM | POA: Diagnosis not present

## 2021-05-30 DIAGNOSIS — R42 Dizziness and giddiness: Secondary | ICD-10-CM | POA: Diagnosis not present

## 2021-05-30 DIAGNOSIS — R0602 Shortness of breath: Secondary | ICD-10-CM | POA: Diagnosis not present

## 2021-05-30 DIAGNOSIS — R55 Syncope and collapse: Secondary | ICD-10-CM | POA: Diagnosis not present

## 2021-06-06 DIAGNOSIS — I10 Essential (primary) hypertension: Secondary | ICD-10-CM | POA: Diagnosis not present

## 2021-06-06 DIAGNOSIS — R42 Dizziness and giddiness: Secondary | ICD-10-CM | POA: Diagnosis not present

## 2021-06-06 DIAGNOSIS — R5381 Other malaise: Secondary | ICD-10-CM | POA: Diagnosis not present

## 2021-06-06 DIAGNOSIS — E119 Type 2 diabetes mellitus without complications: Secondary | ICD-10-CM | POA: Diagnosis not present

## 2021-06-06 DIAGNOSIS — M879 Osteonecrosis, unspecified: Secondary | ICD-10-CM | POA: Diagnosis not present

## 2021-06-06 DIAGNOSIS — R112 Nausea with vomiting, unspecified: Secondary | ICD-10-CM | POA: Diagnosis not present

## 2021-06-06 DIAGNOSIS — E860 Dehydration: Secondary | ICD-10-CM | POA: Diagnosis not present

## 2021-06-06 DIAGNOSIS — R079 Chest pain, unspecified: Secondary | ICD-10-CM | POA: Diagnosis not present

## 2021-06-06 DIAGNOSIS — R1084 Generalized abdominal pain: Secondary | ICD-10-CM | POA: Diagnosis not present

## 2021-06-06 DIAGNOSIS — R1110 Vomiting, unspecified: Secondary | ICD-10-CM | POA: Diagnosis not present

## 2021-06-06 DIAGNOSIS — R509 Fever, unspecified: Secondary | ICD-10-CM | POA: Diagnosis not present

## 2021-06-06 DIAGNOSIS — R1111 Vomiting without nausea: Secondary | ICD-10-CM | POA: Diagnosis not present

## 2021-06-06 DIAGNOSIS — R52 Pain, unspecified: Secondary | ICD-10-CM | POA: Diagnosis not present

## 2021-06-06 DIAGNOSIS — Z7984 Long term (current) use of oral hypoglycemic drugs: Secondary | ICD-10-CM | POA: Diagnosis not present

## 2021-06-06 DIAGNOSIS — R109 Unspecified abdominal pain: Secondary | ICD-10-CM | POA: Diagnosis not present

## 2021-06-06 DIAGNOSIS — R1032 Left lower quadrant pain: Secondary | ICD-10-CM | POA: Diagnosis not present

## 2021-06-07 DIAGNOSIS — E119 Type 2 diabetes mellitus without complications: Secondary | ICD-10-CM | POA: Diagnosis not present

## 2021-06-07 DIAGNOSIS — R1110 Vomiting, unspecified: Secondary | ICD-10-CM | POA: Diagnosis not present

## 2021-06-07 DIAGNOSIS — E7800 Pure hypercholesterolemia, unspecified: Secondary | ICD-10-CM | POA: Diagnosis not present

## 2021-06-07 DIAGNOSIS — J09X2 Influenza due to identified novel influenza A virus with other respiratory manifestations: Secondary | ICD-10-CM | POA: Diagnosis not present

## 2021-06-07 DIAGNOSIS — R0602 Shortness of breath: Secondary | ICD-10-CM | POA: Diagnosis not present

## 2021-06-07 DIAGNOSIS — I10 Essential (primary) hypertension: Secondary | ICD-10-CM | POA: Diagnosis not present

## 2021-06-07 DIAGNOSIS — Z20822 Contact with and (suspected) exposure to covid-19: Secondary | ICD-10-CM | POA: Diagnosis not present

## 2021-06-07 DIAGNOSIS — R1084 Generalized abdominal pain: Secondary | ICD-10-CM | POA: Diagnosis not present

## 2021-06-07 DIAGNOSIS — R1032 Left lower quadrant pain: Secondary | ICD-10-CM | POA: Diagnosis not present

## 2021-06-07 DIAGNOSIS — R051 Acute cough: Secondary | ICD-10-CM | POA: Diagnosis not present

## 2021-06-07 DIAGNOSIS — R509 Fever, unspecified: Secondary | ICD-10-CM | POA: Diagnosis not present

## 2021-06-07 DIAGNOSIS — R112 Nausea with vomiting, unspecified: Secondary | ICD-10-CM | POA: Diagnosis not present

## 2021-06-07 DIAGNOSIS — R079 Chest pain, unspecified: Secondary | ICD-10-CM | POA: Diagnosis not present

## 2021-06-11 DIAGNOSIS — J40 Bronchitis, not specified as acute or chronic: Secondary | ICD-10-CM | POA: Diagnosis not present

## 2021-06-13 DIAGNOSIS — Z20822 Contact with and (suspected) exposure to covid-19: Secondary | ICD-10-CM | POA: Diagnosis not present

## 2021-07-07 DIAGNOSIS — E119 Type 2 diabetes mellitus without complications: Secondary | ICD-10-CM | POA: Diagnosis not present

## 2021-07-11 ENCOUNTER — Other Ambulatory Visit: Payer: Self-pay | Admitting: Family Medicine

## 2021-07-11 DIAGNOSIS — F411 Generalized anxiety disorder: Secondary | ICD-10-CM

## 2021-07-11 NOTE — Telephone Encounter (Signed)
Records release in chart to [facility] in [place], [place]

## 2021-07-16 DIAGNOSIS — F419 Anxiety disorder, unspecified: Secondary | ICD-10-CM | POA: Diagnosis not present

## 2021-07-16 DIAGNOSIS — F329 Major depressive disorder, single episode, unspecified: Secondary | ICD-10-CM | POA: Diagnosis not present

## 2021-07-30 DIAGNOSIS — I10 Essential (primary) hypertension: Secondary | ICD-10-CM | POA: Diagnosis not present

## 2021-07-30 DIAGNOSIS — U071 COVID-19: Secondary | ICD-10-CM | POA: Diagnosis not present

## 2021-07-30 DIAGNOSIS — Z880 Allergy status to penicillin: Secondary | ICD-10-CM | POA: Diagnosis not present

## 2021-07-30 DIAGNOSIS — Z881 Allergy status to other antibiotic agents status: Secondary | ICD-10-CM | POA: Diagnosis not present

## 2021-07-30 DIAGNOSIS — Z743 Need for continuous supervision: Secondary | ICD-10-CM | POA: Diagnosis not present

## 2021-07-30 DIAGNOSIS — R0600 Dyspnea, unspecified: Secondary | ICD-10-CM | POA: Diagnosis not present

## 2021-07-30 DIAGNOSIS — R059 Cough, unspecified: Secondary | ICD-10-CM | POA: Diagnosis not present

## 2021-07-30 DIAGNOSIS — R069 Unspecified abnormalities of breathing: Secondary | ICD-10-CM | POA: Diagnosis not present

## 2021-07-30 DIAGNOSIS — R1111 Vomiting without nausea: Secondary | ICD-10-CM | POA: Diagnosis not present

## 2021-08-07 DIAGNOSIS — E119 Type 2 diabetes mellitus without complications: Secondary | ICD-10-CM | POA: Diagnosis not present

## 2021-08-27 DIAGNOSIS — F329 Major depressive disorder, single episode, unspecified: Secondary | ICD-10-CM | POA: Diagnosis not present

## 2021-08-27 DIAGNOSIS — F419 Anxiety disorder, unspecified: Secondary | ICD-10-CM | POA: Diagnosis not present

## 2021-08-28 ENCOUNTER — Telehealth: Payer: Self-pay | Admitting: Family Medicine

## 2021-09-02 DIAGNOSIS — Z20822 Contact with and (suspected) exposure to covid-19: Secondary | ICD-10-CM | POA: Diagnosis not present

## 2021-09-03 ENCOUNTER — Telehealth: Payer: Self-pay | Admitting: Family Medicine

## 2021-09-03 NOTE — Telephone Encounter (Signed)
Left message for patient to call back and schedule Medicare Annual Wellness Visit (AWV) to be completed by video or phone.   Last AWV: 08/21/2020  Please schedule at anytime with [place]  45 minute appointment  Any questions, please contact me at [phone]

## 2021-09-06 DIAGNOSIS — E119 Type 2 diabetes mellitus without complications: Secondary | ICD-10-CM | POA: Diagnosis not present

## 2021-09-09 ENCOUNTER — Telehealth: Payer: Self-pay | Admitting: Family Medicine

## 2021-09-09 NOTE — Telephone Encounter (Signed)
Left message for patient to call back and schedule Medicare Annual Wellness Visit (AWV) to be completed by video or phone.  ? ?Last AWV: 08/21/2020 ? ?Please schedule at anytime with [place] ? ?45 minute appointment ? ?Any questions, please contact me at [phone]  ?  ?  ?

## 2021-09-30 DIAGNOSIS — M879 Osteonecrosis, unspecified: Secondary | ICD-10-CM | POA: Diagnosis not present

## 2021-09-30 DIAGNOSIS — Z7984 Long term (current) use of oral hypoglycemic drugs: Secondary | ICD-10-CM | POA: Diagnosis not present

## 2021-09-30 DIAGNOSIS — E7800 Pure hypercholesterolemia, unspecified: Secondary | ICD-10-CM | POA: Diagnosis not present

## 2021-09-30 DIAGNOSIS — R1084 Generalized abdominal pain: Secondary | ICD-10-CM | POA: Diagnosis not present

## 2021-09-30 DIAGNOSIS — H6993 Unspecified Eustachian tube disorder, bilateral: Secondary | ICD-10-CM | POA: Diagnosis not present

## 2021-09-30 DIAGNOSIS — E11649 Type 2 diabetes mellitus with hypoglycemia without coma: Secondary | ICD-10-CM | POA: Diagnosis not present

## 2021-09-30 DIAGNOSIS — I10 Essential (primary) hypertension: Secondary | ICD-10-CM | POA: Diagnosis not present

## 2021-09-30 DIAGNOSIS — Z880 Allergy status to penicillin: Secondary | ICD-10-CM | POA: Diagnosis not present

## 2021-09-30 DIAGNOSIS — R109 Unspecified abdominal pain: Secondary | ICD-10-CM | POA: Diagnosis not present

## 2021-10-02 DIAGNOSIS — E119 Type 2 diabetes mellitus without complications: Secondary | ICD-10-CM | POA: Diagnosis not present

## 2021-10-02 DIAGNOSIS — E785 Hyperlipidemia, unspecified: Secondary | ICD-10-CM | POA: Diagnosis not present

## 2021-10-02 DIAGNOSIS — R109 Unspecified abdominal pain: Secondary | ICD-10-CM | POA: Diagnosis not present

## 2021-10-02 DIAGNOSIS — R42 Dizziness and giddiness: Secondary | ICD-10-CM | POA: Diagnosis not present

## 2021-10-02 DIAGNOSIS — E559 Vitamin D deficiency, unspecified: Secondary | ICD-10-CM | POA: Diagnosis not present

## 2021-10-02 DIAGNOSIS — J302 Other seasonal allergic rhinitis: Secondary | ICD-10-CM | POA: Diagnosis not present

## 2021-10-02 DIAGNOSIS — R233 Spontaneous ecchymoses: Secondary | ICD-10-CM | POA: Diagnosis not present

## 2021-10-06 DIAGNOSIS — E119 Type 2 diabetes mellitus without complications: Secondary | ICD-10-CM | POA: Diagnosis not present

## 2021-10-07 DIAGNOSIS — M8700 Idiopathic aseptic necrosis of unspecified bone: Secondary | ICD-10-CM | POA: Diagnosis not present

## 2021-10-07 DIAGNOSIS — M879 Osteonecrosis, unspecified: Secondary | ICD-10-CM | POA: Diagnosis not present

## 2021-10-10 DIAGNOSIS — Z20822 Contact with and (suspected) exposure to covid-19: Secondary | ICD-10-CM | POA: Diagnosis not present

## 2021-10-21 DIAGNOSIS — M87851 Other osteonecrosis, right femur: Secondary | ICD-10-CM | POA: Diagnosis not present

## 2021-10-21 DIAGNOSIS — M25551 Pain in right hip: Secondary | ICD-10-CM | POA: Diagnosis not present

## 2021-10-21 DIAGNOSIS — M25552 Pain in left hip: Secondary | ICD-10-CM | POA: Diagnosis not present

## 2021-10-21 DIAGNOSIS — Z20822 Contact with and (suspected) exposure to covid-19: Secondary | ICD-10-CM | POA: Diagnosis not present

## 2021-10-21 DIAGNOSIS — M87852 Other osteonecrosis, left femur: Secondary | ICD-10-CM | POA: Diagnosis not present

## 2021-10-28 ENCOUNTER — Telehealth: Payer: Self-pay | Admitting: Family Medicine

## 2021-10-28 DIAGNOSIS — M879 Osteonecrosis, unspecified: Secondary | ICD-10-CM | POA: Diagnosis not present

## 2021-10-28 DIAGNOSIS — M8700 Idiopathic aseptic necrosis of unspecified bone: Secondary | ICD-10-CM | POA: Diagnosis not present

## 2021-10-28 NOTE — Telephone Encounter (Signed)
Left message for patient to call back and schedule Medicare Annual Wellness Visit (AWV) to be completed by video or phone.  ? ?Last AWV: 08/21/2020 ? ?Please schedule at anytime with [place] ? ?45 minute appointment ? ?Any questions, please contact me at [phone]  ?  ?  ?

## 2021-11-05 DIAGNOSIS — M87050 Idiopathic aseptic necrosis of pelvis: Secondary | ICD-10-CM | POA: Diagnosis not present

## 2021-11-05 DIAGNOSIS — M87052 Idiopathic aseptic necrosis of left femur: Secondary | ICD-10-CM | POA: Diagnosis not present

## 2021-11-05 DIAGNOSIS — E119 Type 2 diabetes mellitus without complications: Secondary | ICD-10-CM | POA: Diagnosis not present

## 2021-11-05 DIAGNOSIS — M87051 Idiopathic aseptic necrosis of right femur: Secondary | ICD-10-CM | POA: Diagnosis not present

## 2021-11-05 DIAGNOSIS — Z9889 Other specified postprocedural states: Secondary | ICD-10-CM | POA: Diagnosis not present

## 2021-11-09 DIAGNOSIS — Z20822 Contact with and (suspected) exposure to covid-19: Secondary | ICD-10-CM | POA: Diagnosis not present

## 2021-11-11 DIAGNOSIS — F17290 Nicotine dependence, other tobacco product, uncomplicated: Secondary | ICD-10-CM | POA: Diagnosis not present

## 2021-11-11 DIAGNOSIS — Z8616 Personal history of COVID-19: Secondary | ICD-10-CM | POA: Diagnosis not present

## 2021-11-11 DIAGNOSIS — I10 Essential (primary) hypertension: Secondary | ICD-10-CM | POA: Diagnosis not present

## 2021-11-11 DIAGNOSIS — Z20822 Contact with and (suspected) exposure to covid-19: Secondary | ICD-10-CM | POA: Diagnosis not present

## 2021-11-11 DIAGNOSIS — E7800 Pure hypercholesterolemia, unspecified: Secondary | ICD-10-CM | POA: Diagnosis not present

## 2021-11-11 DIAGNOSIS — R52 Pain, unspecified: Secondary | ICD-10-CM | POA: Diagnosis not present

## 2021-11-11 DIAGNOSIS — R0789 Other chest pain: Secondary | ICD-10-CM | POA: Diagnosis not present

## 2021-11-11 DIAGNOSIS — E119 Type 2 diabetes mellitus without complications: Secondary | ICD-10-CM | POA: Diagnosis not present

## 2021-11-11 DIAGNOSIS — R918 Other nonspecific abnormal finding of lung field: Secondary | ICD-10-CM | POA: Diagnosis not present

## 2021-11-11 DIAGNOSIS — R911 Solitary pulmonary nodule: Secondary | ICD-10-CM | POA: Diagnosis not present

## 2021-11-11 DIAGNOSIS — R079 Chest pain, unspecified: Secondary | ICD-10-CM | POA: Diagnosis not present

## 2021-11-11 DIAGNOSIS — R0602 Shortness of breath: Secondary | ICD-10-CM | POA: Diagnosis not present

## 2021-11-18 DIAGNOSIS — M25559 Pain in unspecified hip: Secondary | ICD-10-CM | POA: Diagnosis not present

## 2021-12-05 DIAGNOSIS — E119 Type 2 diabetes mellitus without complications: Secondary | ICD-10-CM | POA: Diagnosis not present

## 2021-12-16 DIAGNOSIS — M25559 Pain in unspecified hip: Secondary | ICD-10-CM | POA: Diagnosis not present

## 2021-12-17 DIAGNOSIS — F419 Anxiety disorder, unspecified: Secondary | ICD-10-CM | POA: Diagnosis not present

## 2021-12-17 DIAGNOSIS — F329 Major depressive disorder, single episode, unspecified: Secondary | ICD-10-CM | POA: Diagnosis not present

## 2021-12-30 DIAGNOSIS — B86 Scabies: Secondary | ICD-10-CM | POA: Diagnosis not present

## 2022-01-04 DIAGNOSIS — E119 Type 2 diabetes mellitus without complications: Secondary | ICD-10-CM | POA: Diagnosis not present

## 2022-01-06 DIAGNOSIS — R0789 Other chest pain: Secondary | ICD-10-CM | POA: Diagnosis not present

## 2022-01-06 DIAGNOSIS — M542 Cervicalgia: Secondary | ICD-10-CM | POA: Diagnosis not present

## 2022-01-06 DIAGNOSIS — Z7182 Exercise counseling: Secondary | ICD-10-CM | POA: Diagnosis not present

## 2022-01-06 DIAGNOSIS — E785 Hyperlipidemia, unspecified: Secondary | ICD-10-CM | POA: Diagnosis not present

## 2022-01-06 DIAGNOSIS — K219 Gastro-esophageal reflux disease without esophagitis: Secondary | ICD-10-CM | POA: Diagnosis not present

## 2022-01-06 DIAGNOSIS — E119 Type 2 diabetes mellitus without complications: Secondary | ICD-10-CM | POA: Diagnosis not present

## 2022-01-06 DIAGNOSIS — Z713 Dietary counseling and surveillance: Secondary | ICD-10-CM | POA: Diagnosis not present

## 2022-01-06 DIAGNOSIS — Z6831 Body mass index (BMI) 31.0-31.9, adult: Secondary | ICD-10-CM | POA: Diagnosis not present

## 2022-01-06 DIAGNOSIS — F411 Generalized anxiety disorder: Secondary | ICD-10-CM | POA: Diagnosis not present

## 2022-01-16 DIAGNOSIS — M25552 Pain in left hip: Secondary | ICD-10-CM | POA: Diagnosis not present

## 2022-01-20 DIAGNOSIS — S30861A Insect bite (nonvenomous) of abdominal wall, initial encounter: Secondary | ICD-10-CM | POA: Diagnosis not present

## 2022-01-20 DIAGNOSIS — M25552 Pain in left hip: Secondary | ICD-10-CM | POA: Diagnosis not present

## 2022-01-27 DIAGNOSIS — M25551 Pain in right hip: Secondary | ICD-10-CM | POA: Diagnosis not present

## 2022-02-04 DIAGNOSIS — E119 Type 2 diabetes mellitus without complications: Secondary | ICD-10-CM | POA: Diagnosis not present

## 2022-03-07 DIAGNOSIS — E119 Type 2 diabetes mellitus without complications: Secondary | ICD-10-CM | POA: Diagnosis not present

## 2022-03-25 DIAGNOSIS — M549 Dorsalgia, unspecified: Secondary | ICD-10-CM | POA: Diagnosis not present

## 2022-03-25 DIAGNOSIS — M62830 Muscle spasm of back: Secondary | ICD-10-CM | POA: Diagnosis not present

## 2022-04-02 DIAGNOSIS — J029 Acute pharyngitis, unspecified: Secondary | ICD-10-CM | POA: Diagnosis not present

## 2022-04-02 DIAGNOSIS — K649 Unspecified hemorrhoids: Secondary | ICD-10-CM | POA: Diagnosis not present

## 2022-04-02 DIAGNOSIS — J069 Acute upper respiratory infection, unspecified: Secondary | ICD-10-CM | POA: Diagnosis not present

## 2022-04-02 DIAGNOSIS — K625 Hemorrhage of anus and rectum: Secondary | ICD-10-CM | POA: Diagnosis not present

## 2022-04-06 DIAGNOSIS — E119 Type 2 diabetes mellitus without complications: Secondary | ICD-10-CM | POA: Diagnosis not present

## 2022-04-15 DIAGNOSIS — Z7182 Exercise counseling: Secondary | ICD-10-CM | POA: Diagnosis not present

## 2022-04-15 DIAGNOSIS — Z6831 Body mass index (BMI) 31.0-31.9, adult: Secondary | ICD-10-CM | POA: Diagnosis not present

## 2022-04-15 DIAGNOSIS — E785 Hyperlipidemia, unspecified: Secondary | ICD-10-CM | POA: Diagnosis not present

## 2022-04-15 DIAGNOSIS — I10 Essential (primary) hypertension: Secondary | ICD-10-CM | POA: Diagnosis not present

## 2022-04-15 DIAGNOSIS — F411 Generalized anxiety disorder: Secondary | ICD-10-CM | POA: Diagnosis not present

## 2022-04-15 DIAGNOSIS — Z713 Dietary counseling and surveillance: Secondary | ICD-10-CM | POA: Diagnosis not present

## 2022-04-15 DIAGNOSIS — H6090 Unspecified otitis externa, unspecified ear: Secondary | ICD-10-CM | POA: Diagnosis not present

## 2022-04-15 DIAGNOSIS — K219 Gastro-esophageal reflux disease without esophagitis: Secondary | ICD-10-CM | POA: Diagnosis not present

## 2022-04-15 DIAGNOSIS — E119 Type 2 diabetes mellitus without complications: Secondary | ICD-10-CM | POA: Diagnosis not present

## 2022-05-01 DIAGNOSIS — R0602 Shortness of breath: Secondary | ICD-10-CM | POA: Diagnosis not present

## 2022-05-01 DIAGNOSIS — J069 Acute upper respiratory infection, unspecified: Secondary | ICD-10-CM | POA: Diagnosis not present

## 2022-05-01 DIAGNOSIS — Z20822 Contact with and (suspected) exposure to covid-19: Secondary | ICD-10-CM | POA: Diagnosis not present

## 2022-05-05 DIAGNOSIS — M8700 Idiopathic aseptic necrosis of unspecified bone: Secondary | ICD-10-CM | POA: Diagnosis not present

## 2022-05-05 DIAGNOSIS — M25551 Pain in right hip: Secondary | ICD-10-CM | POA: Diagnosis not present

## 2022-05-07 DIAGNOSIS — E119 Type 2 diabetes mellitus without complications: Secondary | ICD-10-CM | POA: Diagnosis not present

## 2022-05-12 DIAGNOSIS — M25551 Pain in right hip: Secondary | ICD-10-CM | POA: Diagnosis not present

## 2022-05-12 DIAGNOSIS — M8700 Idiopathic aseptic necrosis of unspecified bone: Secondary | ICD-10-CM | POA: Diagnosis not present

## 2022-05-13 DIAGNOSIS — Z880 Allergy status to penicillin: Secondary | ICD-10-CM | POA: Diagnosis not present

## 2022-05-13 DIAGNOSIS — F410 Panic disorder [episodic paroxysmal anxiety] without agoraphobia: Secondary | ICD-10-CM | POA: Diagnosis not present

## 2022-05-13 DIAGNOSIS — I10 Essential (primary) hypertension: Secondary | ICD-10-CM | POA: Diagnosis not present

## 2022-05-13 DIAGNOSIS — Z881 Allergy status to other antibiotic agents status: Secondary | ICD-10-CM | POA: Diagnosis not present

## 2022-05-13 DIAGNOSIS — Z79899 Other long term (current) drug therapy: Secondary | ICD-10-CM | POA: Diagnosis not present

## 2022-05-13 DIAGNOSIS — E119 Type 2 diabetes mellitus without complications: Secondary | ICD-10-CM | POA: Diagnosis not present

## 2022-05-13 DIAGNOSIS — M87051 Idiopathic aseptic necrosis of right femur: Secondary | ICD-10-CM | POA: Diagnosis not present

## 2022-05-13 DIAGNOSIS — F909 Attention-deficit hyperactivity disorder, unspecified type: Secondary | ICD-10-CM | POA: Diagnosis not present

## 2022-05-13 DIAGNOSIS — F319 Bipolar disorder, unspecified: Secondary | ICD-10-CM | POA: Diagnosis not present

## 2022-05-13 DIAGNOSIS — Z981 Arthrodesis status: Secondary | ICD-10-CM | POA: Diagnosis not present

## 2022-05-13 DIAGNOSIS — Z8616 Personal history of COVID-19: Secondary | ICD-10-CM | POA: Diagnosis not present

## 2022-05-13 DIAGNOSIS — E7800 Pure hypercholesterolemia, unspecified: Secondary | ICD-10-CM | POA: Diagnosis not present

## 2022-05-13 DIAGNOSIS — Z888 Allergy status to other drugs, medicaments and biological substances status: Secondary | ICD-10-CM | POA: Diagnosis not present

## 2022-05-13 DIAGNOSIS — Z7984 Long term (current) use of oral hypoglycemic drugs: Secondary | ICD-10-CM | POA: Diagnosis not present

## 2022-05-13 DIAGNOSIS — Z9889 Other specified postprocedural states: Secondary | ICD-10-CM | POA: Diagnosis not present

## 2022-05-13 DIAGNOSIS — M87851 Other osteonecrosis, right femur: Secondary | ICD-10-CM | POA: Diagnosis not present

## 2022-05-13 DIAGNOSIS — J45909 Unspecified asthma, uncomplicated: Secondary | ICD-10-CM | POA: Diagnosis not present

## 2022-05-25 ENCOUNTER — Encounter (INDEPENDENT_AMBULATORY_CARE_PROVIDER_SITE_OTHER): Payer: Self-pay | Admitting: Gastroenterology

## 2022-06-06 ENCOUNTER — Telehealth: Payer: Self-pay | Admitting: *Deleted

## 2022-06-06 DIAGNOSIS — E119 Type 2 diabetes mellitus without complications: Secondary | ICD-10-CM | POA: Diagnosis not present

## 2022-06-06 NOTE — Telephone Encounter (Signed)
Fax from [place] for BP monitor prescription Fax sent back with note that pt NTBS, last visit was April 2022

## 2022-06-10 DIAGNOSIS — R112 Nausea with vomiting, unspecified: Secondary | ICD-10-CM | POA: Diagnosis not present

## 2022-06-10 DIAGNOSIS — R109 Unspecified abdominal pain: Secondary | ICD-10-CM | POA: Diagnosis not present

## 2022-06-10 DIAGNOSIS — K529 Noninfective gastroenteritis and colitis, unspecified: Secondary | ICD-10-CM | POA: Diagnosis not present

## 2022-06-23 DIAGNOSIS — M8700 Idiopathic aseptic necrosis of unspecified bone: Secondary | ICD-10-CM | POA: Diagnosis not present

## 2022-06-23 DIAGNOSIS — M25551 Pain in right hip: Secondary | ICD-10-CM | POA: Diagnosis not present

## 2022-06-30 DIAGNOSIS — R1111 Vomiting without nausea: Secondary | ICD-10-CM | POA: Diagnosis not present

## 2022-06-30 DIAGNOSIS — E860 Dehydration: Secondary | ICD-10-CM | POA: Diagnosis not present

## 2022-06-30 DIAGNOSIS — Z888 Allergy status to other drugs, medicaments and biological substances status: Secondary | ICD-10-CM | POA: Diagnosis not present

## 2022-06-30 DIAGNOSIS — E119 Type 2 diabetes mellitus without complications: Secondary | ICD-10-CM | POA: Diagnosis not present

## 2022-06-30 DIAGNOSIS — R52 Pain, unspecified: Secondary | ICD-10-CM | POA: Diagnosis not present

## 2022-06-30 DIAGNOSIS — Z881 Allergy status to other antibiotic agents status: Secondary | ICD-10-CM | POA: Diagnosis not present

## 2022-06-30 DIAGNOSIS — R109 Unspecified abdominal pain: Secondary | ICD-10-CM | POA: Diagnosis not present

## 2022-06-30 DIAGNOSIS — R1032 Left lower quadrant pain: Secondary | ICD-10-CM | POA: Diagnosis not present

## 2022-06-30 DIAGNOSIS — R112 Nausea with vomiting, unspecified: Secondary | ICD-10-CM | POA: Diagnosis not present

## 2022-06-30 DIAGNOSIS — Z8616 Personal history of COVID-19: Secondary | ICD-10-CM | POA: Diagnosis not present

## 2022-06-30 DIAGNOSIS — I10 Essential (primary) hypertension: Secondary | ICD-10-CM | POA: Diagnosis not present

## 2022-06-30 DIAGNOSIS — Z7984 Long term (current) use of oral hypoglycemic drugs: Secondary | ICD-10-CM | POA: Diagnosis not present

## 2022-06-30 DIAGNOSIS — Z79899 Other long term (current) drug therapy: Secondary | ICD-10-CM | POA: Diagnosis not present

## 2022-06-30 DIAGNOSIS — F410 Panic disorder [episodic paroxysmal anxiety] without agoraphobia: Secondary | ICD-10-CM | POA: Diagnosis not present

## 2022-06-30 DIAGNOSIS — R197 Diarrhea, unspecified: Secondary | ICD-10-CM | POA: Diagnosis not present

## 2022-06-30 DIAGNOSIS — F319 Bipolar disorder, unspecified: Secondary | ICD-10-CM | POA: Diagnosis not present

## 2022-06-30 DIAGNOSIS — Z880 Allergy status to penicillin: Secondary | ICD-10-CM | POA: Diagnosis not present

## 2022-06-30 DIAGNOSIS — J45909 Unspecified asthma, uncomplicated: Secondary | ICD-10-CM | POA: Diagnosis not present

## 2022-07-06 DIAGNOSIS — E119 Type 2 diabetes mellitus without complications: Secondary | ICD-10-CM | POA: Diagnosis not present

## 2022-12-29 ENCOUNTER — Ambulatory Visit: Payer: Medicare Other | Admitting: Gastroenterology

## 2022-12-29 ENCOUNTER — Encounter: Payer: Self-pay | Admitting: Gastroenterology
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
## 2024-01-27 NOTE — ED Provider Notes (Signed)
 [facility]  ED Provider Note  Preston Berry. 39 y.o. male DOB: 1985/12/23 MRN: [id number] History   Chief Complaint  Patient presents with  . Chest Pain    Started last night with dizziness and L arm tingling. Unable to eat due to stressors with relationship. States the pain lets up between naps.    38 year old male presents to the ED with complaints of chest pain, abdominal pain, nausea, and vomiting for the past several days.  Patient states that over the past several months he has not had any of his medications since he recently moved.  He states that he and his partner have split up, he states that he is moving back home to the beginning of August.  Patient states that he was taking a pill for diabetes, tells me that he believes that he may have type 1 diabetes.  Patient states that he has never taken insulin.  He denies fever, chills, known positive sick contacts, or changes in urination.   History provided by:  Patient Language interpreter used: No        Past Medical History:  Diagnosis Date  . Fatty liver   . Hypertension     Past Surgical History:  Procedure Laterality Date  . Decompression core hip bilateral      Social History   Substance and Sexual Activity  Alcohol Use Not Currently   Tobacco Use History[1] E-Cigarettes  . Vaping Use Current Every Day User   . Start Date    . Cartridges/Day    . Quit Date     Social History   Substance and Sexual Activity  Drug Use Yes  . Types: Marijuana         Allergies[2]  Home Medications   SERTRALINE (ZOLOFT) 100 MG TABLET    Take one tablet (100 mg dose) by mouth daily.    Primary Survey  Primary Survey  Review of Systems   Review of Systems  Cardiovascular:  Positive for chest pain.  Gastrointestinal:  Positive for abdominal pain, nausea and vomiting.    Physical Exam   ED Triage Vitals [01/26/24 2342]  BP (!) 143/97  Heart Rate 98  Resp 16  SpO2 96 %   Temp 99.2 F (37.3 C)    Physical Exam  Vitals reviewed. Constitutional: He appears well-developed and well-nourished. He no respiratory distress.  HENT:  Head: Normocephalic and atraumatic.  Mouth/Throat: Voice normal.  Eyes: EOM are intact.  Neck: Voice normal.  Cardiovascular: Normal rate and regular rhythm.  Pulmonary/Chest: No respiratory distress. Respiratory effort normal.  Abdominal: Soft. There is generalized abdominal tenderness. Abdomen not distended.  Neurological: He is alert and oriented to person, place, and time.  Skin: Skin is warm. Skin is dry.     ED Course   Lab results:   COMPREHENSIVE METABOLIC PANEL - Abnormal      Result Value   Na 136     Potassium 3.7     Cl 97     CO2 23     AGAP 16     Glucose 110 (*)    BUN 11     Creatinine 0.81     Ca 9.6     ALK PHOS 91     T Bili 0.7     Total Protein 8.4     Alb 4.6     GLOBULIN 3.8     ALBUMIN/GLOBULIN RATIO 1.2     BUN/CREAT RATIO 13.6  ALT 36     AST 26     eGFR 116     Comment: Normal GFR (glomerular filtration rate) > 60 mL/min/1.73 meters squared, < 60 may include impaired kidney function. Calculation based on the Chronic Kidney Disease Epidemiology Collaboration (CK-EPI)equation refit without adjustment for race.  GEN5 CARDIAC TROPONIN T (TNT5) BASELINE - Normal   TnT-Gen5 (0hr) 6     Comment: An elevated Troponin indicates myocardial damage. Elevated troponin may also be due to pulmonary emboli, aortic dissection, heart failure, trauma, toxins and ischemia in the setting of critical illness.   MAGNESIUM - Normal   Mg 1.9    LIPASE - Normal   Lipase 16    GEN5 CARDIAC TROPONIN T(TNT5) 1 HOUR - Normal   TnT-Gen5 (1hr) 6     Comment: An elevated Troponin indicates myocardial damage. Elevated troponin may also be due to pulmonary emboli, aortic dissection, heart failure, trauma, toxins and ischemia in the setting of critical illness.   Delta 1 Hour 0    CBC AND DIFFERENTIAL   WBC  6.9     RBC 5.31     HGB 15.4     HCT 46.6     MCV 87.8     MCH 29.0     MCHC 33.0     Plt Ct 359     RDW SD 39.3     MPV 9.4     NRBC% 0.0     Absolute NRBC Count 0.00     NEUTROPHIL % 61.7     LYMPHOCYTE % 28.0     MONOCYTE % 9.2     Eosinophil % 0.4     BASOPHIL % 0.6     IG% 0.1     ABSOLUTE NEUTROPHIL COUNT 4.28     ABSOLUTE LYMPHOCYTE COUNT 1.94     Absolute Monocyte Count 0.64     Absolute Eosinophil Count 0.03     Absolute Basophil Count 0.04     Absolute Immature Granulocyte Count 0.01    URINALYSIS W/MICRO REFLEX CULTURE - SYMPTOMATIC  LIGHT BLUE TOP  GOLD SST    Imaging:   XR CHEST AP PORTABLE   Narrative:    EXAM/TECHNIQUE: XR CHEST AP PORTABLE  INDICATION: Chest Pain  COMPARISON: 01/19/2008  FINDINGS:  Normal heart size. No focal consolidation. No substantial pleural effusion or pneumothorax. No acute osseous abnormality.    Impression:    IMPRESSION:  NO RADIOGRAPHIC EVIDENCE OF ACUTE CARDIOPULMONARY DISEASE.  Electronically Signed by: Victoria Arendt Kosec on 01/27/2024 1:28 AM  CT ABDOMEN PELVIS W IV CONTRAST   Narrative:    EXAM/TECHNIQUE: CT ABDOMEN PELVIS W IV CONTRAST  INDICATION: Abdominal Pain  COMPARISON: None  TECHNIQUE: Utilizing automatic exposure control, helically acquired images were obtained from the lung bases through the symphysis pubis with IV contrast. PO contrast was not administered.  CONTRAST: 75 mL of IOPAMIDOL  76 % IV SOLN. No reaction.  FINDINGS:  Mild bibasilar atelectasis.  Hepatic steatosis. No suspicious focal liver lesion. Normal appearance of the gallbladder, spleen, pancreas, and bilateral adrenal glands.  Bilateral kidneys enhance symmetrically. No nephrolithiasis or hydronephrosis.  Small and large bowel are normal in caliber. Normal appendix.  Normal appearance of the prostate. Mild bladder wall thickening, likely secondary to underdistention.  Normal caliber aorta. Retroaortic left renal vein. No  lymphadenopathy. No free air or free fluid.  No acute or aggressive appearing osseous abnormality.    Impression:    IMPRESSION:   MILD BLADDER WALL THICKENING, LIKELY  SECONDARY TO UNDERDISTENTION. HOWEVER, CONSIDER FURTHER EVALUATION WITH URINALYSIS IF PATIENT HAS URINARY SYMPTOMS.  OTHERWISE, NO ACUTE FINDINGS.  Electronically Signed by: Richerd Doris Gail on 01/27/2024 2:32 AM      ECG: ECG Results          ECG 12 lead (In process)  Result time 01/27/24 01:34:38    In process             Narrative:   Diagnosis Class Abnormal Acquisition Device D3K Systolic BP 142 Diastolic BP 88 Ventricular Rate 83 Atrial Rate 83 P-R Interval 158 QRS Duration 96 Q-T Interval 378 QTC Calculation(Bazett) 444 Calculated P Axis 52 Calculated R Axis -70 Calculated T Axis 42  Diagnosis Normal sinus rhythm Left anterior fascicular block Abnormal ECG When compared with ECG of 26-Jan-2024 23:54, No significant change was found                         ECG 12 lead (In process)  Result time 01/27/24 00:03:18    In process             Narrative:   Diagnosis Class Abnormal Acquisition Device MV360 Ventricular Rate 84 Atrial Rate 84 P-R Interval 160 QRS Duration 98 Q-T Interval 376 QTC Calculation(Bazett) 444 Calculated P Axis 54 Calculated R Axis -72 Calculated T Axis 44  Diagnosis Normal sinus rhythm Left axis deviation Pulmonary disease pattern Minimal voltage criteria for LVH, may be normal variant ( Cornell product ) Abnormal ECG No previous ECGs available                            HEAR Score History: Mostly low risk features ECG: Normal Age: Less than 45 yrs Risk Factors: 1 or 2 risk factors HEAR Score Total: 1                                                        Pre-Sedation Procedures    Medical Decision Making Patient seen and examined, vitals reviewed and are stable.  Physical exam  findings are reassuring.  Lab work is generally overall reassuring as well.  No significant leukocytosis or anemia.  Troponins negative x 2, EKGs reviewed and are without acute ischemia.  Lipase and magnesium WNL.  Chest x-ray is negative for pneumonia, pneumothorax, pulmonary edema.  CT abdomen pelvis demonstrates mild bladder wall thickening, likely secondary to underdistention.  However, consider further evaluation with urinalysis if patient has urinary symptoms.  Discussed lab and imaging findings with the patient.  Patient tells me now that he has been having some urinary symptoms, reports dysuria and urinary frequency for the past several days.  Will obtain a urine sample.  Patient states that he feels comfortable with discharge home, and we will notify him if his urinalysis is positive and send in appropriate antibiotics if needed.  He agrees with this plan.  Patient discharged in stable condition.  Amount and/or Complexity of Data Reviewed Radiology: ordered.  Risk Prescription drug management.            Provider Communication  New Prescriptions   No medications on file    Modified Medications   No medications on file    Discontinued Medications   No medications on file    Clinical Impression  Final diagnoses:  Chest pain, unspecified type  Abdominal pain, unspecified abdominal location    ED Disposition     ED Disposition  Discharge   Condition  Stable   Comment  --                 Follow-up Information     Go to  [facility].   Specialty: Emergency Medicine Comments: As needed, If symptoms worsen Contact information: [address] [place]  [id number] [phone]                 Electronically signed by:       [1] Social History Tobacco Use  Smoking Status Former  . Types: Cigarettes  Smokeless Tobacco Former  [2] Allergies Allergen Reactions  . Penicillins Anaphylaxis  . Keflex Nausea  And Vomiting   Preston SHAUNNA Loffler, PA-C 01/27/24 [phone]

## 2024-01-29 ENCOUNTER — Emergency Department (HOSPITAL_COMMUNITY)

## 2024-01-29 ENCOUNTER — Other Ambulatory Visit: Payer: Self-pay

## 2024-01-29 ENCOUNTER — Ambulatory Visit: Payer: Self-pay

## 2024-01-29 ENCOUNTER — Emergency Department (HOSPITAL_COMMUNITY)
Admission: EM | Admit: 2024-01-29 | Discharge: 2024-01-29 | Disposition: A | Discharge status: Home / Self Care | Attending: Emergency Medicine | Admitting: Emergency Medicine

## 2024-01-29 ENCOUNTER — Encounter (HOSPITAL_COMMUNITY): Payer: Self-pay | Admitting: Emergency Medicine

## 2024-01-29 DIAGNOSIS — E119 Type 2 diabetes mellitus without complications: Secondary | ICD-10-CM | POA: Insufficient documentation

## 2024-01-29 DIAGNOSIS — Z7984 Long term (current) use of oral hypoglycemic drugs: Secondary | ICD-10-CM | POA: Diagnosis not present

## 2024-01-29 DIAGNOSIS — Z7982 Long term (current) use of aspirin: Secondary | ICD-10-CM | POA: Insufficient documentation

## 2024-01-29 DIAGNOSIS — R1084 Generalized abdominal pain: Secondary | ICD-10-CM | POA: Insufficient documentation

## 2024-01-29 DIAGNOSIS — Z79899 Other long term (current) drug therapy: Secondary | ICD-10-CM | POA: Diagnosis not present

## 2024-01-29 DIAGNOSIS — I10 Essential (primary) hypertension: Secondary | ICD-10-CM | POA: Diagnosis not present

## 2024-01-29 LAB — COMPREHENSIVE METABOLIC PANEL WITH GFR
ALT: 45 U/L — ABNORMAL HIGH (ref 0–44)
AST: 31 U/L (ref 15–41)
Albumin: 4.3 g/dL (ref 3.5–5.0)
Alkaline Phosphatase: 68 U/L (ref 38–126)
Anion gap: 13 (ref 5–15)
BUN: 12 mg/dL (ref 6–20)
CO2: 22 mmol/L (ref 22–32)
Calcium: 9.2 mg/dL (ref 8.9–10.3)
Chloride: 103 mmol/L (ref 98–111)
Creatinine, Ser: 0.96 mg/dL (ref 0.61–1.24)
GFR, Estimated: >60 mL/min (ref >60)
Glucose, Bld: 119 mg/dL — ABNORMAL HIGH (ref 70–99)
Potassium: 3.8 mmol/L (ref 3.5–5.1)
Sodium: 138 mmol/L (ref 135–145)
Total Bilirubin: 1.4 mg/dL — ABNORMAL HIGH (ref 0.0–1.2)
Total Protein: 7.7 g/dL (ref 6.5–8.1)

## 2024-01-29 LAB — CBC
HCT: 43.3 % (ref 39.0–52.0)
Hemoglobin: 14.9 g/dL (ref 13.0–17.0)
MCH: 30.1 pg (ref 26.0–34.0)
MCHC: 34.4 g/dL (ref 30.0–36.0)
MCV: 87.5 fL (ref 80.0–100.0)
Platelets: 346 K/uL (ref 150–400)
RBC: 4.95 MIL/uL (ref 4.22–5.81)
RDW: 12 % (ref 11.5–15.5)
WBC: 6.6 K/uL (ref 4.0–10.5)
nRBC: 0 % (ref 0.0–0.2)

## 2024-01-29 LAB — URINALYSIS, ROUTINE W REFLEX MICROSCOPIC
Bacteria, UA: NONE SEEN
Bilirubin Urine: NEGATIVE
Glucose, UA: NEGATIVE mg/dL
Hgb urine dipstick: NEGATIVE
Ketones, ur: 20 mg/dL — AB
Leukocytes,Ua: NEGATIVE
Nitrite: NEGATIVE
Protein, ur: 30 mg/dL — AB
Specific Gravity, Urine: 1.021 (ref 1.005–1.030)
pH: 6 (ref 5.0–8.0)

## 2024-01-29 LAB — CK: Total CK: 311 U/L (ref 49–397)

## 2024-01-29 LAB — TYPE AND SCREEN
ABO/RH(D): A POS
Antibody Screen: NEGATIVE

## 2024-01-29 LAB — LIPASE, BLOOD: Lipase: 28 U/L (ref 11–51)

## 2024-01-29 LAB — POC OCCULT BLOOD, ED: Fecal Occult Blood: NEGATIVE

## 2024-01-29 MED ORDER — FAMOTIDINE 20 MG PO TABS
20.0000 mg | ORAL_TABLET | Freq: Once | ORAL | Status: AC
  Administered 2024-01-29: 20 mg via ORAL
  Filled 2024-01-29: qty 1

## 2024-01-29 MED ORDER — ONDANSETRON HCL 4 MG PO TABS: 4.0000 mg | ORAL_TABLET | Freq: Four times a day (QID) | ORAL | 0 refills | Status: AC

## 2024-01-29 MED ORDER — SODIUM CHLORIDE 0.9 % IV BOLUS
1000.0000 mL | Freq: Once | INTRAVENOUS | Status: AC
  Administered 2024-01-29: 1000 mL via INTRAVENOUS

## 2024-01-29 MED ORDER — LIDOCAINE VISCOUS HCL 2 % MT SOLN
15.0000 mL | Freq: Once | OROMUCOSAL | Status: AC
  Administered 2024-01-29: 15 mL via ORAL
  Filled 2024-01-29: qty 15

## 2024-01-29 MED ORDER — ALUM & MAG HYDROXIDE-SIMETH 200-200-20 MG/5ML PO SUSP
30.0000 mL | Freq: Once | ORAL | Status: AC
  Administered 2024-01-29: 30 mL via ORAL
  Filled 2024-01-29: qty 30

## 2024-01-29 MED ORDER — METOCLOPRAMIDE HCL 5 MG/ML IJ SOLN
10.0000 mg | Freq: Once | INTRAMUSCULAR | Status: AC
  Administered 2024-01-29: 10 mg via INTRAVENOUS
  Filled 2024-01-29: qty 2

## 2024-01-29 MED ORDER — IOHEXOL 300 MG/ML  SOLN
100.0000 mL | Freq: Once | INTRAMUSCULAR | Status: AC | PRN
  Administered 2024-01-29: 100 mL via INTRAVENOUS

## 2024-01-29 MED ORDER — ONDANSETRON HCL 4 MG/2ML IJ SOLN
4.0000 mg | Freq: Once | INTRAMUSCULAR | Status: AC
  Administered 2024-01-29: 4 mg via INTRAVENOUS
  Filled 2024-01-29: qty 2

## 2024-01-29 NOTE — Telephone Encounter (Signed)
 FYI Only or Action Required?: FYI only for provider.  Patient was last seen in primary care on not established.  Called Nurse Triage reporting GI Problem.  Symptoms began several days ago.  Interventions attempted: Nothing.  Symptoms are: gradually worsening.  Triage Disposition: Go to ED Now (Notify PCP)  Patient/caregiver understands and will follow disposition?: Yes  Pt instructed to get to nearest ED, Pt states he will not go back to [place] but will get to [facility], Instructed pt to have someone else drive.     Copied from CRM #[id number]. Topic: Clinical - Red Word Triage >> Jan 29, 2024  3:30 PM Selinda RAMAN wrote: Red Word that prompted transfer to Nurse Triage: The patient called in stating he was in [facility] up to 4 am this morning. He says he has kidney issues and was told he has a GI bleed. He states he has no appetite, dehydrated, and very weak . He also states his poop has been black.  He is not sure why they discharged him. I will transfer him to E2C2 NT Reason for Disposition  [1] Vomiting AND [2] contains red blood or black (coffee ground) material  (Exception: Few red streaks in vomit that only happened once.)  Answer Assessment - Initial Assessment Questions 1. LOCATION: Where does it hurt?      All over abd 2. RADIATION: Does the pain shoot anywhere else? (e.g., chest, back)     Left to right side and radiates around to back  3. ONSET: When did the pain begin? (Minutes, hours or days ago)     6 days 4. SUDDEN: Gradual or sudden onset?     gradual 5. PATTERN Does the pain come and go, or is it constant?     constant 6. SEVERITY: How bad is the pain?  (e.g., Scale 1-10; mild, moderate, or severe)     *No Answer* 7. RECURRENT SYMPTOM: Have you ever had this type of stomach pain before? If Yes, ask: When was the last time? and What happened that time?      *No Answer* 8. CAUSE: What do you think is causing the stomach pain?  (e.g., gallstones, recent abdominal surgery)     Gi bleed 9. RELIEVING/AGGRAVATING FACTORS: What makes it better or worse? (e.g., antacids, bending or twisting motion, bowel movement)     Movement makes pain worse 10. OTHER SYMPTOMS: Do you have any other symptoms? (e.g., back pain, diarrhea, fever, urination pain, vomiting)       Bruising all over abd, black stool, back pain, blood on tissue, hasn't eaten in 6 days, but keeping fluids down, weak, chills, sweats  Protocols used: Abdominal Pain - Male-A-AH

## 2024-01-29 NOTE — ED Provider Notes (Signed)
 [place] EMERGENCY DEPARTMENT AT [facility] Provider Note   CSN: [id number] Arrival date & time: 01/29/24  1740     Patient presents with: Rectal Bleeding   Preston Berry is a 38 y.o. male history of bipolar, diabetes, hypertension, hyperlipidemia presents with complaints of abdominal pain with associated melena.  States the symptoms have been ongoing for the past month.  Reports that he has had episodes of hematemesis as well, also described as dark.   Rectal Bleeding  Past Medical History:  Diagnosis Date   Abdominal pain 02/19/2015   Abnormal EKG 01/27/2019   Arthritis    Bipolar disorder (HCC) 04/10/2014   Chest pain, rule out acute myocardial infarction 12/28/2018   Colitis    Constipation 02/08/2019   Depression    Diabetes mellitus without complication (HCC)    Eczema 09/18/2015   Essential hypertension 01/13/2019   Fatty liver    GERD (gastroesophageal reflux disease)    Gynecomastia    H/O lead exposure 02/19/2015   Hepatic steatosis    Hepatomegaly    Hyperlipidemia    Hypertension    LLQ abdominal pain 02/08/2019   Panic attacks 04/10/2014   Rectal bleeding 02/08/2019   Transaminitis 02/19/2015       Prior to Admission medications   Medication Sig Start Date End Date Taking? Authorizing Provider  ondansetron  (ZOFRAN ) 4 MG tablet Take 1 tablet (4 mg total) by mouth every 6 (six) hours. 01/29/24  Yes Donnajean Lynwood DEL, PA-C  aspirin  EC 325 MG tablet Take 325-650 mg by mouth every 8 (eight) hours as needed (pain).    [provider]  busPIRone  (BUSPAR ) 15 MG tablet Take 1 tablet (15 mg total) by mouth 3 (three) times daily. 10/22/20   Merlynn Niki FALCON, FNP  Empagliflozin-linaGLIPtin (GLYXAMBI ) 25-5 MG TABS Take 1 tablet by mouth daily. 12/20/20   Ijaola, Onyeje M, NP  ibuprofen  (ADVIL ) 200 MG tablet Take 400 mg by mouth every 8 (eight) hours as needed (pain).    [provider]  lisinopril  (ZESTRIL ) 10 MG tablet Take 1 tablet (10 mg total) by  mouth daily. 10/22/20   Merlynn Niki FALCON, FNP  metFORMIN  (GLUCOPHAGE -XR) 500 MG 24 hr tablet Take 1 tablet (500 mg total) by mouth 2 (two) times daily with a meal. 08/10/20   Cherylene Homer HERO, NP  pantoprazole  (PROTONIX ) 40 MG tablet Take 1 tablet (40 mg total) by mouth 2 (two) times daily. 10/25/20   Merlynn Niki FALCON, FNP  Vilazodone  HCl (VIIBRYD  STARTER PACK) 10 & 20 MG KIT Take 10 mg by mouth daily for 7 days, THEN 20 mg daily for 7 days. 10/14/20 10/28/20  Merlynn Niki FALCON, FNP    Allergies: Penicillins, Keflex [cephalexin], and Statins    Review of Systems  Gastrointestinal:  Positive for hematochezia.    Updated Vital Signs BP (!) 152/83   Pulse 89   Temp 97.9 F (36.6 C) (Temporal)   Resp 20   Ht 5' 8 (1.727 m)   Wt 90.7 kg   SpO2 94%   BMI 30.41 kg/m   Physical Exam  (all labs ordered are listed, but only abnormal results are displayed) Labs Reviewed  COMPREHENSIVE METABOLIC PANEL WITH GFR - Abnormal; Notable for the following components:      Result Value   Glucose, Bld 119 (*)    ALT 45 (*)    Total Bilirubin 1.4 (*)    All other components within normal limits  URINALYSIS, ROUTINE W REFLEX MICROSCOPIC -  Abnormal; Notable for the following components:   Ketones, ur 20 (*)    Protein, ur 30 (*)    All other components within normal limits  POC OCCULT BLOOD, ED - Normal  CBC  CK  LIPASE, BLOOD  TYPE AND SCREEN    EKG: None  Radiology: CT ABDOMEN PELVIS W CONTRAST Result Date: 01/29/2024 CLINICAL DATA:  Acute abdominal pain.  Left-sided. EXAM: CT ABDOMEN AND PELVIS WITH CONTRAST TECHNIQUE: Multidetector CT imaging of the abdomen and pelvis was performed using the standard protocol following bolus administration of intravenous contrast. RADIATION DOSE REDUCTION: This exam was performed according to the departmental dose-optimization program which includes automated exposure control, adjustment of the mA and/or kV according to patient size and/or use of iterative  reconstruction technique. CONTRAST:  OMNIPAQUE  IOHEXOL  300 MG/ML  SOLN COMPARISON:  CT abdomen and pelvis 10/05/2019 FINDINGS: Lower chest: No acute abnormality. Hepatobiliary: There is diffuse fatty infiltration of the liver. No focal liver lesions are seen. Gallbladder and bile ducts are within normal limits. Pancreas: Unremarkable. No pancreatic ductal dilatation or surrounding inflammatory changes. Spleen: Normal in size without focal abnormality. Adrenals/Urinary Tract: Adrenal glands are unremarkable. Kidneys are normal, without renal calculi, focal lesion, or hydronephrosis. Bladder is unremarkable. Stomach/Bowel: Stomach is within normal limits. Appendix appears normal. No evidence of bowel wall thickening, distention, or inflammatory changes. There is scattered descending and sigmoid colon diverticula. Vascular/Lymphatic: No significant vascular findings are present. No enlarged abdominal or pelvic lymph nodes. Reproductive: Prostate is unremarkable. Other: No abdominal wall hernia or abnormality. No abdominopelvic ascites. Musculoskeletal: Lucent areas with sclerotic borders seen in the bilateral femoral heads, new from prior. No collapse identified. IMPRESSION: 1. No acute localizing process in the abdomen or pelvis. 2. Fatty infiltration of the liver. 3. Colonic diverticulosis without evidence for diverticulitis. 4. 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 Electronically Signed   By: Greig Pique M.D.   On: 01/29/2024 20:41     Procedures   Medications Ordered in the ED  metoCLOPramide  (REGLAN ) injection 10 mg (has no administration in time range)  famotidine  (PEPCID ) tablet 20 mg (20 mg Oral Given 01/29/24 2009)  alum & mag hydroxide-simeth (MAALOX/MYLANTA) 200-200-20 MG/5ML suspension 30 mL (30 mLs Oral Given 01/29/24 2010)    And  lidocaine  (XYLOCAINE ) 2 % viscous mouth solution 15 mL (15 mLs Oral Given 01/29/24 2010)  iohexol  (OMNIPAQUE ) 300 MG/ML  solution 100 mL (100 mLs Intravenous Contrast Given 01/29/24 2026)  ondansetron  (ZOFRAN ) injection 4 mg (4 mg Intravenous Given 01/29/24 2156)  sodium chloride  0.9 % bolus 1,000 mL (1,000 mLs Intravenous New Bag/Given 01/29/24 2215)                                    Medical Decision Making Amount and/or Complexity of Data Reviewed Labs: ordered. Radiology: ordered.  Risk OTC drugs. Prescription drug management.   This patient presents to the ED with chief complaint(s) of abdominal pain.  The complaint involves an extensive differential diagnosis and also carries with it a high risk of complications and morbidity.   Pertinent past medical history as listed in HPI  The differential diagnosis includes  PUD, GERD, GI bleed, rhabdo Additional history obtained: Records reviewed Care Everywhere/External Records  Assessment and management:   Patient presents hypertensive with complaints of abdominal pain with associated bloody stools and hematemesis both described as melena.  Symptoms have been ongoing for the  past month.  Additionally reports that his urine has been dark.  Reports generalized myalgias.  On exam patient has significant generalized abdominal pain.  His hemoglobin is stable.  Given persistent symptoms for a month with significant tenderness on exam we will obtain CT imaging of the abdomen.  Workup overall reassuring.  Hemoccult negative.  Hemoglobin is in normal range.  CT abdomen pelvis without any intra-abdominal abnormality.  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  Patient states that he had surgeries for this within the past year bilaterally.  He is able to ambulate here.  Will provide follow-up with orthopedics.  He reports mild improvement of his symptoms here.  He is interested in discharge at this time.  Will send in prescription for Zofran  to his pharmacy with GI follow-up.  Independent ECG interpretation:  none  Independent  labs interpretation:  The following labs were independently interpreted:  CBC with stable hemoglobin 14.9, CMP without significant findings, Hemoccult negative, CK within normal limits, UA with elevated ketones, negative nitrites, negative leukocytes, negative hemoglobin  Independent visualization and interpretation of imaging: I independently visualized the following imaging with scope of interpretation limited to determining acute life threatening conditions related to emergency care:  CT abdomen pelvis    Consultations obtained:   none  Disposition:   Patient will be discharged home. The patient has been appropriately medically screened and/or stabilized in the ED. I have low suspicion for any other emergent medical condition which would require further screening, evaluation or treatment in the ED or require inpatient management. At time of discharge the patient is hemodynamically stable and in no acute distress. I have discussed work-up results and diagnosis with patient and answered all questions. Patient is agreeable with discharge plan. We discussed strict return precautions for returning to the emergency department and they verbalized understanding.     Social Determinants of Health:   none  This note was dictated with voice recognition software.  Despite best efforts at proofreading, errors may have occurred which can change the documentation meaning.       Final diagnoses:  Generalized abdominal pain    ED Discharge Orders          Ordered    ondansetron  (ZOFRAN ) 4 MG tablet  Every 6 hours        01/29/24 2301               Donnajean Lynwood DEL, PA-C 01/29/24 2302    Francesca Elsie CROME, MD 01/29/24 [phone]

## 2024-01-29 NOTE — ED Triage Notes (Signed)
 Pt presents with c/o black, tarry stools with left sided abd pain x 1 mo, reports he was seen a couple times, was at hospital in [place] last night and discharged

## 2024-01-29 NOTE — ED Notes (Signed)
 Pt given water  and two packs of crackers for PO challenge.

## 2024-01-29 NOTE — ED Notes (Signed)
 Patient transported to CT

## 2024-01-29 NOTE — Discharge Instructions (Addendum)
 Your lab work did not show any significant abnormality.  Your CT scan showed bilateral avascular necrosis of your hips.  Please follow-up with the orthopedic doctor.  Prescription for nausea medication has been sent into your pharmacy.  Please follow-up with a GI doctor for further evaluation.

## 2024-01-29 NOTE — ED Notes (Signed)
 Pt assessed on PO challenge. Pt said he was able to sip on the water  but it hurt going down. Pt stated the crackers he was unable to tolerate and that it made him feel nauseated. This nurse walked out the room after handing him an emesis bag. Pt heard having a bought of emesis.

## 2024-02-01 ENCOUNTER — Ambulatory Visit: Payer: Self-pay

## 2024-02-01 NOTE — Telephone Encounter (Signed)
 Copied from CRM [id number]. Topic: Complaint (DO NOT CONVERT) - Sensitive >> Feb 01, 2024  9:32 AM Susanna ORN wrote: Date of Incident: 02/01/24 Details of complaint: Patient states that he spoke with Triage nurse and was provided the [facility] number to call, [phone]. Patient states he called for help with psych problems as he's homeless and limited on funds for gas, etc. Patient stated he was treated so bad by the person he spoke with and she was just rude. He is upset. He also stated he reached out because he's been off his medications for a certain amount of time as well. Patient stated to please not send anyone else to [facility].   How would the patient like to see it resolved? Patient wants clinic manager to call and speak with them and he also states, she should probably lose her job. Patient would like for clinic manager to contact him as well. CB #: [id number]. On a scale of 1-10, how was your experience? 9 What would it take to bring it to a 10? I think if the patient gets the help he needs and maybe an apology, then that would bring it to a 10. Patient also wants a call back from clinic manager with a resolution.   Route to [employer] officer, political party.

## 2024-02-01 NOTE — Telephone Encounter (Signed)
 FYI Only or Action Required?: FYI only for provider.  Patient was last seen in primary care on - patient repots being last seen in primary care 8 years ago.  Called Nurse Triage reporting No guideline available.   Triage Disposition: No disposition on file.  Patient/caregiver understands and will follow disposition?:    Copied from CRM [id number]. Topic: Clinical - Red Word Triage >> Feb 01, 2024  8:43 AM Emylou G wrote: [facility] that prompted transfer to Nurse Triage: Internal bleeding.. was in the hospital yesterday - needs f/u appt with doctor - he isn't eating ( mental depression ) been 11 days he has ate anything Answer Assessment - Initial Assessment Questions Patient called in stating he is needing a new PCP appt immediately. Patient was seen in ED yesterday. Patient states he is homeless and he has not eaten in 11 days. Patient states he is suffering with a chronic GI bleed, has been off of his psychotic medications for 6 months. This RN offered a new patient appt but advised patient he needs to return to ED or call 911. Patient refused both ED and 911. Patient was given Behavioral Health resource phone numbers to assist. Patient states he does not intend on harming himself. Patient states he is currently homeless and bouncing around places to live.  Protocols used: No Guideline Available-A-[facility]

## 2024-02-02 ENCOUNTER — Encounter: Payer: Self-pay | Admitting: *Deleted

## 2024-02-02 ENCOUNTER — Encounter (INDEPENDENT_AMBULATORY_CARE_PROVIDER_SITE_OTHER): Payer: Self-pay | Admitting: Gastroenterology

## 2024-02-02 ENCOUNTER — Ambulatory Visit (INDEPENDENT_AMBULATORY_CARE_PROVIDER_SITE_OTHER): Admitting: Gastroenterology

## 2024-02-02 VITALS — BP 136/87 | HR 76 | Temp 98.0°F | Ht 62.0 in | Wt 198.2 lb

## 2024-02-02 DIAGNOSIS — K760 Fatty (change of) liver, not elsewhere classified: Secondary | ICD-10-CM | POA: Diagnosis not present

## 2024-02-02 DIAGNOSIS — R109 Unspecified abdominal pain: Secondary | ICD-10-CM

## 2024-02-02 DIAGNOSIS — R1310 Dysphagia, unspecified: Secondary | ICD-10-CM

## 2024-02-02 DIAGNOSIS — R1319 Other dysphagia: Secondary | ICD-10-CM

## 2024-02-02 DIAGNOSIS — R1112 Projectile vomiting: Secondary | ICD-10-CM | POA: Insufficient documentation

## 2024-02-02 DIAGNOSIS — R7401 Elevation of levels of liver transaminase levels: Secondary | ICD-10-CM

## 2024-02-02 DIAGNOSIS — K920 Hematemesis: Secondary | ICD-10-CM | POA: Diagnosis not present

## 2024-02-02 DIAGNOSIS — E66811 Obesity, class 1: Secondary | ICD-10-CM
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
## 2024-02-04 ENCOUNTER — Telehealth (INDEPENDENT_AMBULATORY_CARE_PROVIDER_SITE_OTHER): Payer: Self-pay | Admitting: *Deleted

## 2024-02-04 NOTE — Telephone Encounter (Signed)
 Called [facility] to check status of EM/pH study referral - scheduler stated they called patient on 02/03/24 and he told them he was having it done elsewhere - they closed the referral

## 2024-02-10 ENCOUNTER — Other Ambulatory Visit (HOSPITAL_COMMUNITY)

## 2024-02-16 ENCOUNTER — Encounter (HOSPITAL_COMMUNITY)
Admission: RE | Admit: 2024-02-16 | Discharge: 2024-02-16 | Disposition: A | Discharge status: Home / Self Care | Source: Ambulatory Visit | Attending: Gastroenterology | Admitting: Gastroenterology

## 2024-02-17 ENCOUNTER — Telehealth: Payer: Self-pay | Admitting: *Deleted

## 2024-02-17 NOTE — Pre-Procedure Instructions (Signed)
 Pre-op phone call attempted. Patient states he does not have a driver for his procedure tomorrow and he will have to cancel. Office messaged.

## 2024-02-17 NOTE — Telephone Encounter (Signed)
 Called pt. He is not wanting to reschedule at this time. He stated he is currently homeless and is sleeping where he can for free at the moment and doesn't have a driver at the moment. He will call when he can reschedule once he is able.

## 2024-02-17 NOTE — Telephone Encounter (Signed)
-----   Message from Nurse Luke BROCKS sent at 02/17/2024  8:32 AM EDT ----- Regarding: rescedule Happy Hump day!! I finally got in touch with Harman Skiff this morning and he states he does not have a driver for tomorrow, so he needs to reschedule his procedure.

## 2024-02-18 ENCOUNTER — Ambulatory Visit (HOSPITAL_COMMUNITY): Admission: RE | Admit: 2024-02-18 | Source: Home / Self Care | Admitting: Gastroenterology

## 2024-02-18 ENCOUNTER — Encounter (HOSPITAL_COMMUNITY): Admission: RE | Payer: Self-pay | Source: Home / Self Care

## 2024-02-18 SURGERY — EGD (ESOPHAGOGASTRODUODENOSCOPY)
Anesthesia: Choice

## 2024-04-19 ENCOUNTER — Encounter (INDEPENDENT_AMBULATORY_CARE_PROVIDER_SITE_OTHER): Payer: Self-pay | Admitting: Gastroenterology

## 2024-04-20 ENCOUNTER — Encounter (INDEPENDENT_AMBULATORY_CARE_PROVIDER_SITE_OTHER): Payer: Self-pay | Admitting: Gastroenterology

## 2024-07-19 ENCOUNTER — Other Ambulatory Visit (HOSPITAL_COMMUNITY): Payer: Self-pay | Admitting: Orthopedic Surgery

## 2024-07-19 DIAGNOSIS — M8700 Idiopathic aseptic necrosis of unspecified bone: Secondary | ICD-10-CM

## 2024-07-25 ENCOUNTER — Ambulatory Visit (HOSPITAL_COMMUNITY)
Admission: RE | Admit: 2024-07-25 | Discharge: 2024-07-25 | Disposition: A | Discharge status: Home / Self Care | Source: Ambulatory Visit | Attending: Orthopedic Surgery | Admitting: Orthopedic Surgery

## 2024-07-25 DIAGNOSIS — M8700 Idiopathic aseptic necrosis of unspecified bone: Secondary | ICD-10-CM | POA: Diagnosis present
